# Patient Record
Sex: Female | Born: 1940
Health system: Southern US, Community
[De-identification: ages and names within clinical notes are randomized; demographics above are authoritative.]

## PROBLEM LIST (undated history)

## (undated) DIAGNOSIS — R053 Chronic cough: Secondary | ICD-10-CM

## (undated) DIAGNOSIS — N814 Uterovaginal prolapse, unspecified: Secondary | ICD-10-CM

## (undated) DIAGNOSIS — S32019A Unspecified fracture of first lumbar vertebra, initial encounter for closed fracture: Secondary | ICD-10-CM

## (undated) DIAGNOSIS — Z8719 Personal history of other diseases of the digestive system: Secondary | ICD-10-CM

## (undated) DIAGNOSIS — N840 Polyp of corpus uteri: Secondary | ICD-10-CM

## (undated) DIAGNOSIS — M1990 Unspecified osteoarthritis, unspecified site: Secondary | ICD-10-CM

## (undated) DIAGNOSIS — Z9289 Personal history of other medical treatment: Secondary | ICD-10-CM

## (undated) DIAGNOSIS — J302 Other seasonal allergic rhinitis: Secondary | ICD-10-CM

## (undated) DIAGNOSIS — Z860100 Personal history of colon polyps, unspecified: Secondary | ICD-10-CM

## (undated) DIAGNOSIS — N3281 Overactive bladder: Secondary | ICD-10-CM

## (undated) DIAGNOSIS — R21 Rash and other nonspecific skin eruption: Secondary | ICD-10-CM

## (undated) DIAGNOSIS — N950 Postmenopausal bleeding: Secondary | ICD-10-CM

## (undated) DIAGNOSIS — M542 Cervicalgia: Secondary | ICD-10-CM

## (undated) DIAGNOSIS — L309 Dermatitis, unspecified: Secondary | ICD-10-CM

## (undated) DIAGNOSIS — K449 Diaphragmatic hernia without obstruction or gangrene: Secondary | ICD-10-CM

## (undated) DIAGNOSIS — Z8781 Personal history of (healed) traumatic fracture: Secondary | ICD-10-CM

## (undated) DIAGNOSIS — M549 Dorsalgia, unspecified: Secondary | ICD-10-CM

## (undated) DIAGNOSIS — M5136 Other intervertebral disc degeneration, lumbar region: Secondary | ICD-10-CM

## (undated) DIAGNOSIS — T7840XA Allergy, unspecified, initial encounter: Secondary | ICD-10-CM

## (undated) DIAGNOSIS — G709 Myoneural disorder, unspecified: Secondary | ICD-10-CM

## (undated) DIAGNOSIS — F419 Anxiety disorder, unspecified: Secondary | ICD-10-CM

## (undated) DIAGNOSIS — M51369 Other intervertebral disc degeneration, lumbar region without mention of lumbar back pain or lower extremity pain: Secondary | ICD-10-CM

## (undated) DIAGNOSIS — H269 Unspecified cataract: Secondary | ICD-10-CM

## (undated) DIAGNOSIS — R05 Cough: Secondary | ICD-10-CM

## (undated) DIAGNOSIS — K219 Gastro-esophageal reflux disease without esophagitis: Secondary | ICD-10-CM

## (undated) DIAGNOSIS — R911 Solitary pulmonary nodule: Secondary | ICD-10-CM

## (undated) DIAGNOSIS — E0789 Other specified disorders of thyroid: Secondary | ICD-10-CM

## (undated) HISTORY — DX: Allergy, unspecified, initial encounter: T78.40XA

## (undated) HISTORY — PX: CYSTOSCOPY: SUR368

## (undated) HISTORY — DX: Unspecified cataract: H26.9

## (undated) HISTORY — DX: Dorsalgia, unspecified: M54.9

## (undated) HISTORY — DX: Unspecified fracture of first lumbar vertebra, initial encounter for closed fracture: S32.019A

## (undated) HISTORY — PX: CATARACT EXTRACTION: SUR2

## (undated) HISTORY — PX: LAPAROSCOPIC PARTIAL RIGHT COLECTOMY: SHX5913

## (undated) HISTORY — DX: Anxiety disorder, unspecified: F41.9

## (undated) HISTORY — DX: Myoneural disorder, unspecified: G70.9

## (undated) HISTORY — PX: OTHER SURGICAL HISTORY: SHX169

## (undated) HISTORY — DX: Cervicalgia: M54.2

## (undated) HISTORY — PX: CATARACT EXTRACTION W/ INTRAOCULAR LENS  IMPLANT, BILATERAL: SHX1307

## (undated) HISTORY — PX: EYE SURGERY: SHX253

---
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
## 2006-07-26 ENCOUNTER — Ambulatory Visit (HOSPITAL_COMMUNITY): Admission: RE | Admit: 2006-07-26 | Discharge: 2006-07-27 | Payer: Self-pay | Admitting: Ophthalmology

## 2009-02-18 ENCOUNTER — Encounter: Admission: RE | Admit: 2009-02-18 | Discharge: 2009-02-18 | Payer: Self-pay | Admitting: Family Medicine

## 2010-01-06 ENCOUNTER — Ambulatory Visit (HOSPITAL_COMMUNITY): Admission: RE | Admit: 2010-01-06 | Discharge: 2010-01-06 | Payer: Self-pay | Admitting: General Surgery

## 2010-02-04 ENCOUNTER — Inpatient Hospital Stay (HOSPITAL_COMMUNITY): Admission: RE | Admit: 2010-02-04 | Discharge: 2010-02-09 | Payer: Self-pay | Admitting: General Surgery

## 2010-02-04 ENCOUNTER — Encounter (INDEPENDENT_AMBULATORY_CARE_PROVIDER_SITE_OTHER): Payer: Self-pay | Admitting: General Surgery

## 2010-08-04 ENCOUNTER — Other Ambulatory Visit: Payer: Self-pay | Admitting: General Surgery

## 2010-08-12 ENCOUNTER — Other Ambulatory Visit: Payer: Self-pay

## 2010-08-26 LAB — CBC
HCT: 32.1 % — ABNORMAL LOW (ref 36.0–46.0)
Hemoglobin: 10.8 g/dL — ABNORMAL LOW (ref 12.0–15.0)
Hemoglobin: 11 g/dL — ABNORMAL LOW (ref 12.0–15.0)
MCH: 33.3 pg (ref 26.0–34.0)
MCHC: 34.2 g/dL (ref 30.0–36.0)
MCV: 97.3 fL (ref 78.0–100.0)
Platelets: 167 10*3/uL (ref 150–400)
RDW: 13.6 % (ref 11.5–15.5)
WBC: 5.8 10*3/uL (ref 4.0–10.5)

## 2010-08-26 LAB — BASIC METABOLIC PANEL
BUN: 2 mg/dL — ABNORMAL LOW (ref 6–23)
BUN: 5 mg/dL — ABNORMAL LOW (ref 6–23)
CO2: 29 mEq/L (ref 19–32)
Calcium: 8.2 mg/dL — ABNORMAL LOW (ref 8.4–10.5)
Chloride: 104 mEq/L (ref 96–112)
Creatinine, Ser: 0.8 mg/dL (ref 0.4–1.2)
GFR calc Af Amer: >60 mL/min (ref >60)
GFR calc Af Amer: >60 mL/min (ref >60)
GFR calc non Af Amer: >60 mL/min (ref >60)
GFR calc non Af Amer: >60 mL/min (ref >60)
Glucose, Bld: 114 mg/dL — ABNORMAL HIGH (ref 70–99)
Glucose, Bld: 185 mg/dL — ABNORMAL HIGH (ref 70–99)
Glucose, Bld: 96 mg/dL (ref 70–99)
Potassium: 4.5 mEq/L (ref 3.5–5.1)
Sodium: 134 mEq/L — ABNORMAL LOW (ref 135–145)

## 2010-08-26 LAB — MAGNESIUM
Magnesium: 1.5 mg/dL (ref 1.5–2.5)
Magnesium: 2.1 mg/dL (ref 1.5–2.5)

## 2010-08-26 LAB — COMPREHENSIVE METABOLIC PANEL
AST: 20 U/L (ref 0–37)
Chloride: 106 mEq/L (ref 96–112)
Creatinine, Ser: 0.79 mg/dL (ref 0.4–1.2)
GFR calc non Af Amer: >60 mL/min (ref >60)
Glucose, Bld: 82 mg/dL (ref 70–99)
Potassium: 4.1 mEq/L (ref 3.5–5.1)
Total Bilirubin: 0.6 mg/dL (ref 0.3–1.2)

## 2010-08-26 LAB — DIFFERENTIAL
Basophils Relative: 0 % (ref 0–1)
Eosinophils Relative: 4 % (ref 0–5)
Lymphocytes Relative: 31 % (ref 12–46)
Lymphs Abs: 1.6 10*3/uL (ref 0.7–4.0)
Monocytes Absolute: 0.4 10*3/uL (ref 0.1–1.0)
Neutro Abs: 2.9 10*3/uL (ref 1.7–7.7)

## 2010-08-26 LAB — PROTIME-INR
INR: 0.86 (ref 0.00–1.49)
Prothrombin Time: 11.9 seconds (ref 11.6–15.2)

## 2010-08-27 LAB — COMPREHENSIVE METABOLIC PANEL
Alkaline Phosphatase: 48 U/L (ref 39–117)
BUN: 8 mg/dL (ref 6–23)
Creatinine, Ser: 0.64 mg/dL (ref 0.4–1.2)
GFR calc Af Amer: >60 mL/min (ref >60)
Potassium: 3.9 mEq/L (ref 3.5–5.1)
Total Bilirubin: 1 mg/dL (ref 0.3–1.2)
Total Protein: 6.4 g/dL (ref 6.0–8.3)

## 2010-08-27 LAB — DIFFERENTIAL
Basophils Absolute: 0 10*3/uL (ref 0.0–0.1)
Basophils Relative: 0 % (ref 0–1)
Eosinophils Relative: 5 % (ref 0–5)
Lymphocytes Relative: 33 % (ref 12–46)
Lymphs Abs: 1.2 10*3/uL (ref 0.7–4.0)
Neutrophils Relative %: 54 % (ref 43–77)

## 2010-08-27 LAB — PROTIME-INR
INR: 0.98 (ref 0.00–1.49)
Prothrombin Time: 12.9 seconds (ref 11.6–15.2)

## 2010-08-27 LAB — SURGICAL PCR SCREEN
MRSA, PCR: NEGATIVE
Staphylococcus aureus: NEGATIVE

## 2010-08-27 LAB — CBC
Hemoglobin: 12.2 g/dL (ref 12.0–15.0)
MCH: 33.7 pg (ref 26.0–34.0)
MCHC: 34.9 g/dL (ref 30.0–36.0)
MCV: 96.5 fL (ref 78.0–100.0)

## 2010-10-28 NOTE — Op Note (Signed)
NAMEMANUELITA, MOXON               ACCOUNT NO.:  [id number]   MEDICAL RECORD NO.:  [id number]          PATIENT TYPE:  AMB   LOCATION:  SDS                          FACILITY:  [facility]   PHYSICIAN:  John D. Ashley Royalty, M.D. 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   PROCEDURE:  Pars plana vitrectomy, retinal photocoagulation, membrane  peel, left eye.   SURGEON:  Alan Mulder, M.D.   ASSISTANT:  Rosalie Doctor, MA   ANESTHESIA:  General.   DETAILS:  Usual prep and drape, lid speculum was placed.  The indirect  ophthalmoscope laser was moved into place and 1145 burns placed around  the retinal periphery in two concentric rows.  The power was between 500  and 560 milliwatts, 1000 microns each and 0.1 seconds each.  The  conjunctival peritomy, peritomies at 10 to 4 o'clock.  The infusion at 4  o'clock.  Contact lens ring sutured into place at 3, at 6 and 12  o'clock.  The lighted pick and the cutter were placed at 10 and 2  o'clock respectively.  Pars plana vitrectomy was begun just behind the  crystalline lens.  Membranes were encountered and removed under low  suction and rapid cutting.  The flat contact lens was placed and the  macula was inspected.  Macula was drawn superiorly superotemporally and  severely folded in this area.  The magnifying contact lens was placed.  The membrane was engaged with the 20-gauge MVR blade and peeled with the  ring forceps.  The entire macular region was peeled both for the disk  and temporally.  The macula was thrown into folds but it was free and  mobile the diamond dusted membrane scraper was used to smooth the  macular region.  The contact lens ring and the contact lens were  removed.  The Biom was moved into place.  The vitrectomy was carried  into the far periphery with the vitreous cutter.  Once all  vitreous was  removed down to the vitreous blunt and sharp dissection, the periphery  was inspected.  There were no breaks.  The instruments were removed from  the eye. 9-0 nylon was used to close the sclerotomy sites.  The  conjunctiva was closed with wet-field cautery.  Polymyxin and gentamicin  were irrigated into Tenon's space, Decadron 10 mg was injected to the  lower subconjunctival space.  Marcaine was injected around the globe for  postop pain.  Atropine was applied.  TobraDex ophthalmic ointment, a  patch and shield were placed.  Closing pressure was 10 with a [place]  tonometer.  The patient is awakened and taken to recovery in  satisfactory condition.   COMPLICATIONS:  None.   DURATION:  1 hour.     Beulah Gandy. Ashley Royalty, M.D.  Electronically Signed    JDM/MEDQ  D:  07/26/2006  T:  07/26/2006  Job:  [id number]

## 2011-11-01 ENCOUNTER — Other Ambulatory Visit: Payer: Self-pay | Admitting: Family Medicine

## 2011-11-01 DIAGNOSIS — M2550 Pain in unspecified joint: Secondary | ICD-10-CM

## 2011-11-01 MED ORDER — IBUPROFEN 600 MG PO TABS: 600.0000 mg | ORAL_TABLET | Freq: Three times a day (TID) | ORAL | Status: DC | PRN

## 2011-11-02 ENCOUNTER — Other Ambulatory Visit: Payer: Self-pay | Admitting: Family Medicine

## 2012-01-19 ENCOUNTER — Encounter: Payer: Self-pay | Admitting: Family Medicine

## 2013-01-07 ENCOUNTER — Ambulatory Visit (INDEPENDENT_AMBULATORY_CARE_PROVIDER_SITE_OTHER): Payer: Medicare PPO | Admitting: Emergency Medicine

## 2013-01-07 VITALS — BP 115/65 | HR 80 | Temp 98.7°F | Resp 18 | Wt 151.0 lb

## 2013-01-07 DIAGNOSIS — M719 Bursopathy, unspecified: Secondary | ICD-10-CM

## 2013-01-07 DIAGNOSIS — M25511 Pain in right shoulder: Secondary | ICD-10-CM

## 2013-01-07 DIAGNOSIS — M25519 Pain in unspecified shoulder: Secondary | ICD-10-CM

## 2013-01-07 DIAGNOSIS — M7551 Bursitis of right shoulder: Secondary | ICD-10-CM

## 2013-01-07 MED ORDER — MELOXICAM 7.5 MG PO TABS: 7.5000 mg | ORAL_TABLET | Freq: Every day | ORAL | Status: DC

## 2013-01-07 NOTE — Patient Instructions (Addendum)
Shoulder Pain  The shoulder is the joint that connects your arms to your body. The bones that form the shoulder joint include the upper arm bone (humerus), the shoulder blade (scapula), and the collarbone (clavicle). The top of the humerus is shaped like a ball and fits into a rather flat socket on the scapula (glenoid cavity). A combination of muscles and strong, fibrous tissues that connect muscles to bones (tendons) support your shoulder joint and hold the ball in the socket. Small, fluid-filled sacs (bursae) are located in different areas of the joint. They act as cushions between the bones and the overlying soft tissues and help reduce friction between the gliding tendons and the bone as you move your arm. Your shoulder joint allows a wide range of motion in your arm. This range of motion allows you to do things like scratch your back or throw a ball. However, this range of motion also makes your shoulder more prone to pain from overuse and injury.  Causes of shoulder pain can originate from both injury and overuse and usually can be grouped in the following four categories:   Redness, swelling, and pain (inflammation) of the tendon (tendinitis) or the bursae (bursitis).   Instability, such as a dislocation of the joint.   Inflammation of the joint (arthritis).   Broken bone (fracture).  HOME CARE INSTRUCTIONS    Apply ice to the sore area.   Put ice in a plastic bag.   Place a towel between your skin and the bag.   Leave the ice on for 15-20 minutes, 3-4 times per day for the first 2 days.   Stop using cold packs if they do not help with the pain.   If you have a shoulder sling or immobilizer, wear it as long as your caregiver instructs. Only remove it to shower or bathe. Move your arm as little as possible, but keep your hand moving to prevent swelling.   Squeeze a soft ball or foam pad as much as possible to help prevent swelling.   Only take over-the-counter or prescription medicines for pain,  discomfort, or fever as directed by your caregiver.  SEEK MEDICAL CARE IF:    Your shoulder pain increases, or new pain develops in your arm, hand, or fingers.   Your hand or fingers become cold and numb.   Your pain is not relieved with medicines.  SEEK IMMEDIATE MEDICAL CARE IF:    Your arm, hand, or fingers are numb or tingling.   Your arm, hand, or fingers are significantly swollen or turn white or blue.  MAKE SURE YOU:    Understand these instructions.   Will watch your condition.   Will get help right away if you are not doing well or get worse.  Document Released: 03/08/2005 Document Revised: 02/21/2012 Document Reviewed: 05/13/2011  ExitCare Patient Information 2014 [employer].

## 2013-01-07 NOTE — Progress Notes (Signed)
[facility] [address] [place] [address] [phone]  Date:  01/07/2013   Name:  Sheila Anderson   DOB:  Mar 04, 1941   MRN:  [id number]  PCP:  No primary provider on file.    Chief Complaint: Shoulder Pain and Rash   History of Present Illness:  Sheila Anderson is a 72 y.o. very pleasant female patient who presents with the following:  History of injury to her shoulder and has a right rotator cuff tear.  Says she is experiencing increased pain in her shoulder recently.  She says that she often is treated with an injection of cortisone.  Has no recent overuse or reinjury.  No improvement with over the counter medications or other home remedies. Denies other complaint or health concern today.   There are no active problems to display for this patient.   Past Medical History  Diagnosis Date  . Allergy   . Neuromuscular disorder   . Cataract   . Anxiety     Past Surgical History  Procedure Laterality Date  . Eye surgery    . Cyst removal from bladder      History  Substance Use Topics  . Smoking status: Never Smoker   . Smokeless tobacco: Not on file  . Alcohol Use: Yes    Family History  Problem Relation Age of Onset  . Heart disease Mother   . Congestive Heart Failure Father   . Multiple sclerosis Sister     No Known Allergies  Medication list has been reviewed and updated.  No current outpatient prescriptions on file prior to visit.   No current facility-administered medications on file prior to visit.    Review of Systems:  As per HPI, otherwise negative.    Physical Examination: Filed Vitals:   01/07/13 1119  BP: 115/65  Pulse: 80  Temp: 98.7 F (37.1 C)  Resp: 18   Filed Vitals:   01/07/13 1119  Weight: 151 lb (68.493 kg)   There is no height on file to calculate BMI. Ideal Body Weight:     GEN: WDWN, NAD, Non-toxic, Alert & Oriented x 3 HEENT: Atraumatic, Normocephalic.  Ears and Nose: No external  deformity. EXTR: No clubbing/cyanosis/edema NEURO: Normal gait.  PSYCH: Normally interactive. Conversant. Not depressed or anxious appearing.  Calm demeanor.  RIGHT shoulder:  Tender anterior shoulder bursa  Assessment and Plan: RIGHT shoulder bursitis Depo 80 marcaine 1.5 ml mobic  Signed,  Phillips Odor, MD

## 2013-02-03 ENCOUNTER — Encounter (INDEPENDENT_AMBULATORY_CARE_PROVIDER_SITE_OTHER): Payer: Medicare PPO | Admitting: Ophthalmology

## 2013-02-03 DIAGNOSIS — H2510 Age-related nuclear cataract, unspecified eye: Secondary | ICD-10-CM

## 2013-02-03 DIAGNOSIS — H43819 Vitreous degeneration, unspecified eye: Secondary | ICD-10-CM

## 2013-02-03 DIAGNOSIS — H35379 Puckering of macula, unspecified eye: Secondary | ICD-10-CM

## 2013-02-03 DIAGNOSIS — H3530 Unspecified macular degeneration: Secondary | ICD-10-CM

## 2013-05-27 ENCOUNTER — Ambulatory Visit: Payer: Medicare PPO

## 2013-05-27 ENCOUNTER — Ambulatory Visit (INDEPENDENT_AMBULATORY_CARE_PROVIDER_SITE_OTHER): Payer: Medicare PPO | Admitting: Family Medicine

## 2013-05-27 VITALS — BP 117/72 | HR 76 | Temp 98.4°F | Resp 16 | Ht 67.0 in | Wt 151.0 lb

## 2013-05-27 DIAGNOSIS — R05 Cough: Secondary | ICD-10-CM

## 2013-05-27 DIAGNOSIS — M719 Bursopathy, unspecified: Secondary | ICD-10-CM

## 2013-05-27 DIAGNOSIS — R079 Chest pain, unspecified: Secondary | ICD-10-CM

## 2013-05-27 DIAGNOSIS — M7150 Other bursitis, not elsewhere classified, unspecified site: Secondary | ICD-10-CM

## 2013-05-27 DIAGNOSIS — R0781 Pleurodynia: Secondary | ICD-10-CM

## 2013-05-27 DIAGNOSIS — R059 Cough, unspecified: Secondary | ICD-10-CM

## 2013-05-27 MED ORDER — PREDNISONE 20 MG PO TABS: 40.0000 mg | ORAL_TABLET | Freq: Every day | ORAL | Status: DC

## 2013-05-27 NOTE — Patient Instructions (Signed)
Gastroesophageal Reflux Disease, Adult  Gastroesophageal reflux disease (GERD) happens when acid from your stomach flows up into the esophagus. When acid comes in contact with the esophagus, the acid causes soreness (inflammation) in the esophagus. Over time, GERD may create small holes (ulcers) in the lining of the esophagus.  CAUSES   · Increased body weight. This puts pressure on the stomach, making acid rise from the stomach into the esophagus.  · Smoking. This increases acid production in the stomach.  · Drinking alcohol. This causes decreased pressure in the lower esophageal sphincter (valve or ring of muscle between the esophagus and stomach), allowing acid from the stomach into the esophagus.  · Late evening meals and a full stomach. This increases pressure and acid production in the stomach.  · A malformed lower esophageal sphincter.  Sometimes, no cause is found.  SYMPTOMS   · Burning pain in the lower part of the mid-chest behind the breastbone and in the mid-stomach area. This may occur twice a week or more often.  · Trouble swallowing.  · Sore throat.  · Dry cough.  · Asthma-like symptoms including chest tightness, shortness of breath, or wheezing.  DIAGNOSIS   Your caregiver may be able to diagnose GERD based on your symptoms. In some cases, X-rays and other tests may be done to check for complications or to check the condition of your stomach and esophagus.  TREATMENT   Your caregiver may recommend over-the-counter or prescription medicines to help decrease acid production. Ask your caregiver before starting or adding any new medicines.   HOME CARE INSTRUCTIONS   · Change the factors that you can control. Ask your caregiver for guidance concerning weight loss, quitting smoking, and alcohol consumption.  · Avoid foods and drinks that make your symptoms worse, such as:  · Caffeine or alcoholic drinks.  · Chocolate.  · Peppermint or mint flavorings.  · Garlic and onions.  · Spicy foods.  · Citrus fruits,  such as oranges, lemons, or limes.  · Tomato-based foods such as sauce, chili, salsa, and pizza.  · Fried and fatty foods.  · Avoid lying down for the 3 hours prior to your bedtime or prior to taking a nap.  · Eat small, frequent meals instead of large meals.  · Wear loose-fitting clothing. Do not wear anything tight around your waist that causes pressure on your stomach.  · Raise the head of your bed 6 to 8 inches with wood blocks to help you sleep. Extra pillows will not help.  · Only take over-the-counter or prescription medicines for pain, discomfort, or fever as directed by your caregiver.  · Do not take aspirin, ibuprofen, or other nonsteroidal anti-inflammatory drugs (NSAIDs).  SEEK IMMEDIATE MEDICAL CARE IF:   · You have pain in your arms, neck, jaw, teeth, or back.  · Your pain increases or changes in intensity or duration.  · You develop nausea, vomiting, or sweating (diaphoresis).  · You develop shortness of breath, or you faint.  · Your vomit is green, yellow, black, or looks like coffee grounds or blood.  · Your stool is red, bloody, or black.  These symptoms could be signs of other problems, such as heart disease, gastric bleeding, or esophageal bleeding.  MAKE SURE YOU:   · Understand these instructions.  · Will watch your condition.  · Will get help right away if you are not doing well or get worse.  Document Released: 03/08/2005 Document Revised: 08/21/2011 Document Reviewed: 12/16/2010  ExitCare® Patient   Information ©2014 [employer].

## 2013-05-27 NOTE — Progress Notes (Signed)
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 She has cats at home although they don't go into her bedroom.  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  Objective:  NAD Chest clear HEENT: mild injection of conjunctiviae Left ischium is tender.  Trochanter is normal.  SLR normal. No bony abn, normal ROM  [facility] reading (PRIMARY) by  Dr. Milus Glazier:  Normal left ribs and chest  Cough - Plan: DG Ribs Unilateral W/Chest Left, DG Chest 2 View, predniSONE (DELTASONE) 20 MG tablet  Rib pain on left side - Plan: DG Ribs Unilateral W/Chest Left, predniSONE (DELTASONE) 20 MG tablet  Bursitis - Plan: predniSONE (DELTASONE) 20 MG tablet  Signed, Elvina Sidle, MD  .

## 2013-07-31 ENCOUNTER — Ambulatory Visit (INDEPENDENT_AMBULATORY_CARE_PROVIDER_SITE_OTHER): Payer: Medicare PPO | Admitting: Family Medicine

## 2013-07-31 VITALS — BP 100/56 | HR 79 | Temp 98.4°F | Resp 16 | Ht 67.5 in | Wt 155.0 lb

## 2013-07-31 DIAGNOSIS — Z7184 Encounter for health counseling related to travel: Secondary | ICD-10-CM

## 2013-07-31 DIAGNOSIS — Z7189 Other specified counseling: Secondary | ICD-10-CM

## 2013-07-31 DIAGNOSIS — Z23 Encounter for immunization: Secondary | ICD-10-CM

## 2013-07-31 MED ORDER — TYPHOID VACCINE PO CPDR: 1.0000 | DELAYED_RELEASE_CAPSULE | ORAL | Status: DC

## 2013-07-31 MED ORDER — HEPATITIS A VACCINE 1440 EL U/ML IM SUSP: 1.0000 mL | Freq: Once | INTRAMUSCULAR | Status: DC

## 2013-07-31 NOTE — Patient Instructions (Signed)
Thank you for coming in today  1. We will give you your first Hep A vaccine today. The second injection is due in 6-12 months 2. Typhoid vaccine is an oral medication that you will get from your pharmacy. Take 1 tablet every other day for a total of 4 doses. You should complete this at least 1 week before travel.  CDC website is an Youth worker.

## 2013-07-31 NOTE — Progress Notes (Signed)
   Subjective:    Patient ID: Sheila Anderson, female    DOB: 07/22/1940, 73 y.o.   MRN: [id number]  HPI Patient is going to [place], [place] and presents for evaluation for pre-travel vaccines and medications. Leaving March 28th, 2015.  No medical problems, very healthy. NKDA. Got flu and shingles vaccine today.  Review of Systems  All other systems reviewed and are negative.      Objective:   Physical Exam  Constitutional: She is oriented to person, place, and time. She appears well-developed and well-nourished. No distress.  HENT:  Head: Normocephalic and atraumatic.  Eyes: Conjunctivae are normal. Pupils are equal, round, and reactive to light. No scleral icterus.  Cardiovascular: Normal rate.   Pulmonary/Chest: Effort normal.  Musculoskeletal: Normal range of motion.  Neurological: She is alert and oriented to person, place, and time.  Skin: Skin is warm and dry. She is not diaphoretic.  Psychiatric: She has a normal mood and affect. Her behavior is normal.      Assessment & Plan:  #1. Travel preparation - No malaria area risk - Typhoid vaccine Rx sent and discussed - Hep A today

## 2014-02-05 ENCOUNTER — Ambulatory Visit (INDEPENDENT_AMBULATORY_CARE_PROVIDER_SITE_OTHER): Payer: Medicare PPO | Admitting: Ophthalmology

## 2014-02-17 ENCOUNTER — Ambulatory Visit (INDEPENDENT_AMBULATORY_CARE_PROVIDER_SITE_OTHER): Payer: Medicare PPO | Admitting: Ophthalmology

## 2014-03-03 ENCOUNTER — Ambulatory Visit (INDEPENDENT_AMBULATORY_CARE_PROVIDER_SITE_OTHER): Payer: Medicare PPO | Admitting: Ophthalmology

## 2014-03-03 DIAGNOSIS — H3530 Unspecified macular degeneration: Secondary | ICD-10-CM

## 2014-03-03 DIAGNOSIS — H35379 Puckering of macula, unspecified eye: Secondary | ICD-10-CM

## 2014-03-03 DIAGNOSIS — H43819 Vitreous degeneration, unspecified eye: Secondary | ICD-10-CM

## 2014-03-03 DIAGNOSIS — H2510 Age-related nuclear cataract, unspecified eye: Secondary | ICD-10-CM

## 2014-07-20 DIAGNOSIS — Z961 Presence of intraocular lens: Secondary | ICD-10-CM | POA: Diagnosis not present

## 2014-07-20 DIAGNOSIS — H2511 Age-related nuclear cataract, right eye: Secondary | ICD-10-CM | POA: Diagnosis not present

## 2014-07-21 DIAGNOSIS — H5201 Hypermetropia, right eye: Secondary | ICD-10-CM | POA: Diagnosis not present

## 2014-07-21 DIAGNOSIS — Z961 Presence of intraocular lens: Secondary | ICD-10-CM | POA: Diagnosis not present

## 2014-07-21 DIAGNOSIS — H35372 Puckering of macula, left eye: Secondary | ICD-10-CM | POA: Diagnosis not present

## 2014-07-21 DIAGNOSIS — H2511 Age-related nuclear cataract, right eye: Secondary | ICD-10-CM | POA: Diagnosis not present

## 2014-07-21 DIAGNOSIS — H524 Presbyopia: Secondary | ICD-10-CM | POA: Diagnosis not present

## 2014-09-09 ENCOUNTER — Telehealth: Payer: Self-pay

## 2015-01-07 DIAGNOSIS — M6740 Ganglion, unspecified site: Secondary | ICD-10-CM | POA: Diagnosis not present

## 2015-01-07 DIAGNOSIS — L821 Other seborrheic keratosis: Secondary | ICD-10-CM | POA: Diagnosis not present

## 2015-01-07 DIAGNOSIS — S60561A Insect bite (nonvenomous) of right hand, initial encounter: Secondary | ICD-10-CM | POA: Diagnosis not present

## 2015-01-07 DIAGNOSIS — L918 Other hypertrophic disorders of the skin: Secondary | ICD-10-CM | POA: Diagnosis not present

## 2015-01-12 ENCOUNTER — Encounter (HOSPITAL_COMMUNITY): Payer: Self-pay | Admitting: *Deleted

## 2015-01-12 ENCOUNTER — Emergency Department (HOSPITAL_COMMUNITY)
Admission: EM | Admit: 2015-01-12 | Discharge: 2015-01-12 | Disposition: A | Discharge status: Home / Self Care | Payer: Medicare PPO | Attending: Emergency Medicine | Admitting: Emergency Medicine

## 2015-01-12 ENCOUNTER — Emergency Department (HOSPITAL_COMMUNITY): Payer: Medicare PPO

## 2015-01-12 ENCOUNTER — Ambulatory Visit (INDEPENDENT_AMBULATORY_CARE_PROVIDER_SITE_OTHER): Payer: Medicare PPO | Admitting: Family Medicine

## 2015-01-12 VITALS — BP 132/68 | HR 88 | Temp 98.4°F | Resp 18 | Ht 72.0 in | Wt 153.4 lb

## 2015-01-12 DIAGNOSIS — M25512 Pain in left shoulder: Secondary | ICD-10-CM | POA: Insufficient documentation

## 2015-01-12 DIAGNOSIS — R030 Elevated blood-pressure reading, without diagnosis of hypertension: Secondary | ICD-10-CM | POA: Diagnosis not present

## 2015-01-12 DIAGNOSIS — R9431 Abnormal electrocardiogram [ECG] [EKG]: Secondary | ICD-10-CM | POA: Diagnosis not present

## 2015-01-12 DIAGNOSIS — R0602 Shortness of breath: Secondary | ICD-10-CM | POA: Diagnosis not present

## 2015-01-12 DIAGNOSIS — R05 Cough: Secondary | ICD-10-CM

## 2015-01-12 DIAGNOSIS — Z8659 Personal history of other mental and behavioral disorders: Secondary | ICD-10-CM | POA: Insufficient documentation

## 2015-01-12 DIAGNOSIS — H269 Unspecified cataract: Secondary | ICD-10-CM | POA: Insufficient documentation

## 2015-01-12 DIAGNOSIS — Z79899 Other long term (current) drug therapy: Secondary | ICD-10-CM | POA: Insufficient documentation

## 2015-01-12 DIAGNOSIS — R350 Frequency of micturition: Secondary | ICD-10-CM

## 2015-01-12 DIAGNOSIS — R059 Cough, unspecified: Secondary | ICD-10-CM

## 2015-01-12 LAB — CBC WITH DIFFERENTIAL/PLATELET
BASOS ABS: 0 10*3/uL (ref 0.0–0.1)
BASOS PCT: 1 % (ref 0–1)
EOS PCT: 4 % (ref 0–5)
Eosinophils Absolute: 0.2 10*3/uL (ref 0.0–0.7)
HEMATOCRIT: 40.2 % (ref 36.0–46.0)
HEMOGLOBIN: 13.4 g/dL (ref 12.0–15.0)
Lymphocytes Relative: 38 % (ref 12–46)
Lymphs Abs: 1.4 10*3/uL (ref 0.7–4.0)
MCH: 31.7 pg (ref 26.0–34.0)
MCHC: 33.3 g/dL (ref 30.0–36.0)
MCV: 95 fL (ref 78.0–100.0)
Monocytes Absolute: 0.4 10*3/uL (ref 0.1–1.0)
Monocytes Relative: 11 % (ref 3–12)
NEUTROS PCT: 46 % (ref 43–77)
Neutro Abs: 1.7 10*3/uL (ref 1.7–7.7)
Platelets: 253 10*3/uL (ref 150–400)
RBC: 4.23 MIL/uL (ref 3.87–5.11)
RDW: 13.3 % (ref 11.5–15.5)
WBC: 3.7 10*3/uL — ABNORMAL LOW (ref 4.0–10.5)

## 2015-01-12 LAB — I-STAT TROPONIN, ED: TROPONIN I, POC: 0 ng/mL (ref 0.00–0.08)

## 2015-01-12 LAB — BASIC METABOLIC PANEL
ANION GAP: 10 (ref 5–15)
BUN: 11 mg/dL (ref 6–20)
CO2: 27 mmol/L (ref 22–32)
Calcium: 9.7 mg/dL (ref 8.9–10.3)
Chloride: 104 mmol/L (ref 101–111)
Creatinine, Ser: 0.68 mg/dL (ref 0.44–1.00)
Glucose, Bld: 78 mg/dL (ref 65–99)
Potassium: 3.7 mmol/L (ref 3.5–5.1)
Sodium: 141 mmol/L (ref 135–145)

## 2015-01-12 MED ORDER — MOMETASONE FUROATE 50 MCG/ACT NA SUSP: 2.0000 | Freq: Every day | NASAL | Status: DC

## 2015-01-12 NOTE — Patient Instructions (Signed)
Increase your Zantac to 150 mg 2x/day for at least 2 weeks to see if your heartburn is changed.  Use the nose spray to see if that will help your cough.  We placed a PPD today because of your possible exposure to TB earlier this year - it needs to be read in 48-72 hours.  [facility] for your likely overactive bladder to help with that.

## 2015-01-12 NOTE — ED Provider Notes (Signed)
CSN: [id number]     Arrival date & time 01/12/15  1642 History   First MD Initiated Contact with Patient 01/12/15 1654     Chief Complaint  Patient presents with  . Shortness of Breath  . Shoulder Pain     (Consider location/radiation/quality/duration/timing/severity/associated sxs/prior Treatment) The history is provided by the patient and medical records.   74 year old female with history of allergies, anxiety, cataracts, presenting to the ED for shortness of breath. Patient states she was at the beauty salon getting her hair done earlier today when symptoms began. She states she was sitting underneath the dry air and got hot since she turned the temperature down but then became cold. She states all of a sudden she began to feel short of breath but thought this was due to her anxiety. She states she was seen in urgent care but continue to have a slight sensation of shortness of breath and was sent here for further evaluation. Patient also reported some left shoulder pain at urgent care. 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 She denies any known injury, trauma, or falls to left shoulder. She has no numbness or weakness of left arm. She has no reported chest pain. She has no known cardiac history. No recent travel, surgeries, trauma, prolonged immobilization, estrogen use.  No history of DVT or PE. Vital signs stable.  Past Medical History  Diagnosis Date  . Allergy   . Neuromuscular disorder   . Cataract   . Anxiety   . Anxiety    Past Surgical History  Procedure Laterality Date  . Eye surgery    . Cyst removal from bladder     Family History  Problem Relation Age of Onset  . Heart disease Mother   . Congestive Heart Failure Father   . Multiple sclerosis Sister    History  Substance Use Topics  . Smoking status: Never Smoker   . Smokeless tobacco: Not on file  . Alcohol Use: Yes   OB History    No data available     Review of Systems    Respiratory: Positive for shortness of breath.   All other systems reviewed and are negative.     Allergies  Nickel  Home Medications   Prior to Admission medications   Medication Sig Start Date End Date Taking? Authorizing Provider  Ascorbic Acid (VITAMIN C PO) Take 1 tablet by mouth as needed.    Historical Provider, MD  cetirizine (ZYRTEC) 10 MG tablet Take 10 mg by mouth daily.    Historical Provider, MD  COD LIVER OIL PO Take 1 capsule by mouth daily.    Historical Provider, MD  Coenzyme Q10 (CO Q 10 PO) Take 1 tablet by mouth daily.    Historical Provider, MD  Glucos-MSM-C-Mn-Ginger-Willow (GLUCOSAMINE MSM COMPLEX PO) Take 1 capsule by mouth daily.    Historical Provider, MD  mometasone (NASONEX) 50 MCG/ACT nasal spray Place 2 sprays into the nose daily. 01/12/15   Mancel Bale, PA-C  Multiple Vitamins-Minerals (ICAPS AREDS 2 PO) Take 1 capsule by mouth daily.    Historical Provider, MD  Multiple Vitamins-Minerals (ONE-A-DAY WOMENS 50 PLUS PO) Take 1 tablet by mouth daily.    Historical Provider, MD  Multiple Vitamins-Minerals (PRESERVISION AREDS 2 PO) Take 1 tablet by mouth daily.    Historical Provider, MD  phenazopyridine (PYRIDIUM) 97 MG tablet Take 97 mg by mouth 3 (three) times daily as needed for pain.  Historical Provider, MD  ranitidine (ZANTAC) 150 MG tablet Take 150 mg by mouth as needed for heartburn.    Historical Provider, MD  Soy Protein-Soy Isoflavone (SOY CARE MENOPAUSE PO) Take 1 tablet by mouth 2 (two) times daily.    Historical Provider, MD  VITAMIN E PO Take 1 capsule by mouth as needed.    Historical Provider, MD   BP 138/84 mmHg  Pulse 78  Temp(Src) 98.6 F (37 C) (Oral)  Resp 24  SpO2 97%   Physical Exam  Constitutional: She is oriented to person, place, and time. She appears well-developed and well-nourished. No distress.  HENT:  Head: Normocephalic and atraumatic.  Mouth/Throat: Oropharynx is clear and moist.  Eyes: Conjunctivae and EOM are  normal. Pupils are equal, round, and reactive to light.  Neck: Normal range of motion. Neck supple.  Cardiovascular: Normal rate, regular rhythm and normal heart sounds.   Pulmonary/Chest: Effort normal and breath sounds normal. No respiratory distress. She has no wheezes.  Abdominal: Soft. Bowel sounds are normal. There is no tenderness. There is no guarding.  Musculoskeletal: Normal range of motion. She exhibits no edema.       Arms: Reproducible tenderness of left superior shoulder without acute bony deformity; no signs of trauma; full range of motion maintained; no swelling or varicosities noted; arm remains neurovascularly intact  Neurological: She is alert and oriented to person, place, and time.  Skin: Skin is warm and dry. She is not diaphoretic.  Psychiatric: She has a normal mood and affect.  Nursing note and vitals reviewed.   ED Course  Procedures (including critical care time) Labs Review Labs Reviewed  CBC WITH DIFFERENTIAL/PLATELET - Abnormal; Notable for the following:    WBC 3.7 (*)    All other components within normal limits  BASIC METABOLIC PANEL  I-STAT TROPOININ, ED    Imaging Review Dg Chest 2 View  01/12/2015   CLINICAL DATA:  SOB today, felt hot then cold and anxious. Left shoulder pain x 2 days.  EXAM: CHEST  2 VIEW  COMPARISON:  None.  FINDINGS: Cardiac silhouette is normal in size and configuration. Normal mediastinal and hilar contours. The lungs are hyperexpanded. Lungs are clear. No pleural effusion or pneumothorax.  Bony thorax is demineralized but grossly intact.  IMPRESSION: No acute cardiopulmonary disease.   Electronically Signed   By: Lajean Manes M.D.   On: 01/12/2015 17:51     EKG Interpretation   Date/Time:  Tuesday January 12 2015 16:46:44 EDT Ventricular Rate:  71 PR Interval:  155 QRS Duration: 99 QT Interval:  401 QTC Calculation: 436 R Axis:   77 Text Interpretation:  Sinus rhythm no ST/T abnormality Confirmed by  [facility]  MD, TREY  ([id number]) on 01/12/2015 5:13:55 PM      MDM   Final diagnoses:  SOB (shortness of breath)  Left shoulder pain   74 year old female here with shortness of breath. She was evaluated at urgent care and sent here for further evaluation. Patient is otherwise healthy, no known cardiac disease. EKG is reassuring. Chest x-ray is clear.  Labwork is reassuring.  Remains without any complaint of chest pain.  Patient's left shoulder pain seems musculoskeletal as it is reproducible on exam. 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  She has no known cardiac RF, no RF for DVT/PE.  Symptoms seem atypical and may be related to anxiety. I have lower suspicion for ACS, PE, dissection, or other acute cardiac event at this time  given negative work-up here and lack of chest pain.  Patient d/c home in stable condition.  Cardiology follow-up given for any new/worsening symptoms.  Discussed plan with patient, he/she acknowledged understanding and agreed with plan of care.  Return precautions given for new or worsening symptoms.  Case discussed with attending physician, Dr. Doy Mince, who evaluated patient and agrees with assessment and plan of care.  Larene Pickett, PA-C 01/12/15 2022  Serita Grit, MD 01/14/15 [phone]

## 2015-01-12 NOTE — Progress Notes (Signed)
Sheila Anderson  MRN: [id number] DOB: 1940-11-21  Subjective:  Pt presents to clinic with SOB that started while she was under the dryer getting her hair done. She thought the heat was to hot so she turned it down but her SOB did not get better.  She thought her bra might be to tight but she took it off and that did not help.  She felt a little anxious during this time but she did not have chest pain.  She still about 1.5h later has the sensation that she cannot get a full deep breath.    She has had a cough for several months that has been attributed to her allergies and reflux.  She takes zantac as needed for her heartburn when she has symptoms and zyrtec daily for her allergies but she does not feel like it is helping that much anymore.  She is still congested and feels like stuff runs down the back of her throat.  She feels like the cough is worse at night and she does not lay flat as a result.  She was exposed to TB at work several months ago but she has not other TB symptoms.  Did not sleep well last night. - she thinks related to the left shoulder - last few days.  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  She also has overactive bladder and has had for a while which she uses AZO for but it has not been working successfully recently.  Patient Active Problem List   Diagnosis Date Noted  . Left shoulder pain 01/12/2015    Current Outpatient Prescriptions on File Prior to Visit  Medication Sig Dispense Refill  . cetirizine (ZYRTEC) 10 MG tablet Take 10 mg by mouth daily.    . COD LIVER OIL PO Take 1 capsule by mouth daily.    . Coenzyme Q10 (CO Q 10 PO) Take 1 tablet by mouth daily.    Donnie Aho (GLUCOSAMINE MSM COMPLEX PO) Take 1 capsule by mouth daily.    . Multiple Vitamins-Minerals (ONE-A-DAY WOMENS 50 PLUS PO) Take 1 tablet by mouth daily.    . Multiple Vitamins-Minerals (PRESERVISION AREDS 2 PO) Take 1 tablet by mouth daily.    . Soy  Protein-Soy Isoflavone (SOY CARE MENOPAUSE PO) Take 1 tablet by mouth 2 (two) times daily.    Marland Kitchen VITAMIN E PO Take 1 capsule by mouth as needed.    . Ascorbic Acid (VITAMIN C PO) Take 1 tablet by mouth as needed.    . ranitidine (ZANTAC) 150 MG tablet Take 150 mg by mouth as needed for heartburn.     No current facility-administered medications on file prior to visit.    Allergies  Allergen Reactions  . Nickel Rash    Review of Systems  Constitutional: Negative for fever, chills and unexpected weight change.  Respiratory: Positive for cough (dry) and shortness of breath (acture onset this late morning).   Cardiovascular: Negative for chest pain.  Genitourinary: Positive for frequency.  Allergic/Immunologic: Positive for environmental allergies.   Objective:  BP 132/68 mmHg  Pulse 88  Temp(Src) 98.4 F (36.9 C) (Oral)  Resp 18  Ht 6' (1.829 m)  Wt 153 lb 6.4 oz (69.582 kg)  BMI 20.80 kg/m2  SpO2 97%  Physical Exam  Constitutional: She is oriented to person, place, and time and well-developed, well-nourished, and in no distress.  HENT:  Head: Normocephalic and atraumatic.  Right Ear: Hearing and external  ear normal.  Left Ear: Hearing and external ear normal.  Eyes: Conjunctivae are normal.  Neck: Normal range of motion. Neck supple.  Cardiovascular: Normal rate, regular rhythm and normal heart sounds.   No murmur heard. Pulmonary/Chest: Effort normal and breath sounds normal. She has no wheezes. She exhibits no tenderness.  Abdominal: Soft. Bowel sounds are normal.  Neurological: She is alert and oriented to person, place, and time. Gait normal.  Skin: Skin is warm and dry.  Psychiatric: Mood, memory, affect and judgment normal.  Vitals reviewed.  EKG - slight St depression in aVF and V3 - d/w Dr Lorelei Pont Assessment and Plan :  SOB (shortness of breath) - Plan: EKG 12-Lead, TB Skin Test  Cough - Plan: TB Skin Test, mometasone (NASONEX) 50 MCG/ACT nasal spray - we will  add an allergy medication and she will increase her Zantac 150 to bid for at least 2 weeks to see if control of her reflux will help with her night time cough.  Nonspecific abnormal electrocardiogram (ECG) (EKG) - slight change from 2008 EKG - due to age and acute onset of SOB without resolution she needs to be ruled out from a cardiac perspective.  Due to use sending her to ED by EMS we will let them do her CXR at the ED.  A PPD was placed due to her concern from possible exposure though she is having no other symptoms of TB she will feel more comfortable knowing her results.  Urinary frequency - to be dealt with in the future - gave her [facility] name she stated that she does not need a referral - she will let me know if she has problems.  Left shoulder pain - to be dealt with at a later time - this has been long going and does not seem to be related to her current SOB.  Windell Hummingbird PA-C  [facility] 01/12/2015 4:15 PM

## 2015-01-12 NOTE — ED Notes (Signed)
Pt was at the beauty shop getting her hair done and began to have anxiety with SOB. Pt went to [facility] for further eval.  [facility] sent her to ED b/c of Left Shoulder pain and SOB.  Per EMS VS are as follows: BP: 160/92 HR: 70 Resp:18 SPO2:99% on RA

## 2015-01-12 NOTE — Discharge Instructions (Signed)
Your work-up today was normal. Follow-up with cardiology. Return here for any new/worsening symptoms.

## 2015-01-14 ENCOUNTER — Ambulatory Visit (INDEPENDENT_AMBULATORY_CARE_PROVIDER_SITE_OTHER): Payer: Medicare PPO | Admitting: Physician Assistant

## 2015-01-14 DIAGNOSIS — Z7689 Persons encountering health services in other specified circumstances: Secondary | ICD-10-CM

## 2015-01-14 DIAGNOSIS — Z111 Encounter for screening for respiratory tuberculosis: Secondary | ICD-10-CM

## 2015-01-14 LAB — TB SKIN TEST
Induration: 0 mm
TB SKIN TEST: NEGATIVE

## 2015-01-14 NOTE — Progress Notes (Signed)
Pt here for tb read

## 2015-02-23 DIAGNOSIS — M7541 Impingement syndrome of right shoulder: Secondary | ICD-10-CM | POA: Diagnosis not present

## 2015-03-01 ENCOUNTER — Encounter: Payer: Self-pay | Admitting: Interventional Cardiology

## 2015-03-01 ENCOUNTER — Ambulatory Visit (INDEPENDENT_AMBULATORY_CARE_PROVIDER_SITE_OTHER): Payer: Medicare PPO | Admitting: Interventional Cardiology

## 2015-03-01 VITALS — BP 119/60 | HR 80 | Ht 67.0 in | Wt 153.0 lb

## 2015-03-01 DIAGNOSIS — R42 Dizziness and giddiness: Secondary | ICD-10-CM

## 2015-03-01 DIAGNOSIS — R0602 Shortness of breath: Secondary | ICD-10-CM | POA: Diagnosis not present

## 2015-03-01 DIAGNOSIS — M25512 Pain in left shoulder: Secondary | ICD-10-CM | POA: Diagnosis not present

## 2015-03-01 NOTE — Addendum Note (Signed)
Addended by: Stanton Kidney on: 03/01/2015 11:32 AM   Modules accepted: Orders

## 2015-03-01 NOTE — Progress Notes (Signed)
No PCP in system to route PCP report to.

## 2015-03-01 NOTE — Patient Instructions (Signed)
Medication Instructions:  Your physician recommends that you continue on your current medications as directed. Please refer to the Current Medication list given to you today.  Labwork: None ordered  Testing/Procedures: Your physician has requested that you have an exercise tolerance test. For further information please visit [email or web address]. Please also follow instruction sheet, as given.  Your physician has requested that you have an echocardiogram. Echocardiography is a painless test that uses sound waves to create images of your heart. It provides your doctor with information about the size and shape of your heart and how well your heart's chambers and valves are working. This procedure takes approximately one hour. There are no restrictions for this procedure.  Follow-Up: No follow up is needed at this time with Dr. Irish Lack.  He will see you on an as needed basis.    Any Other Special Instructions Will Be Listed Below (If Applicable). Thank you for choosing [facility]!!

## 2015-03-01 NOTE — Progress Notes (Signed)
Patient ID: Sheila Anderson, female   DOB: 07-26-1940, 74 y.o.   MRN: [id number]     Cardiology Office Note   Date:  03/01/2015   ID:  Sheila Anderson, DOB 08-Jun-1941, MRN [id number]  PCP:  No PCP Per Patient    Chief Complaint  Patient presents with  . [place]     Wt Readings from Last 3 Encounters:  03/01/15 153 lb (69.4 kg)  01/12/15 153 lb 6.4 oz (69.582 kg)  07/31/13 155 lb (70.308 kg)       History of Present Illness: Sheila Anderson is a 74 y.o. female  Who has a h/o GERD.  She has a h/o feeling lightheaded after eating fast.  She wsa getting her hair dried at the beauty shop.  She felt hot and turned down the drier.  She still felt hot.  She felt llightheaded.  She did not pass out.  No change with standing.  Not close to passing out.  In the past, she thinks that she had lightheadedness in the past after eating fast, particularly french fries.  She has not had a cardiac eval in the past.  She went to ER after the beauty shop episode.  She had a negative w/u in the ER.  She was sent home.  They thought it may be anxiety per her report.  Earlier that day, she had left shoulder pain. Not related to troponin. She ruled out for MI.  Normal troponin and CXR.  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    Chronic SHOB when she lies flat, but she thinks this may be anxiety.  Since that time, no concerning episodes.  She had a mild episode when eating fast before a restaurant was about to close. Not as bad a s the episode which took her to the ER.   Father had severe valuvar heart disease.      Past Medical History  Diagnosis Date  . Allergy   . Neuromuscular disorder   . Cataract   . Anxiety   . Anxiety     Past Surgical History  Procedure Laterality Date  . Eye surgery    . Cyst removal from bladder       Current Outpatient Prescriptions  Medication Sig Dispense Refill  . Ascorbic Acid (VITAMIN C PO) Take 1 tablet by mouth as needed.    . cetirizine  (ZYRTEC) 10 MG tablet Take 10 mg by mouth daily.    . clobetasol ointment (TEMOVATE) 0.05 % APPLY TO AFFECTED AREA SPARINGLY EVERY OTHER DAY  0  . COD LIVER OIL PO Take 1 capsule by mouth daily.    . Coenzyme Q10 (CO Q 10 PO) Take 1 tablet by mouth daily.    Donnie Aho (GLUCOSAMINE MSM COMPLEX PO) Take 1 capsule by mouth daily.    . mometasone (NASONEX) 50 MCG/ACT nasal spray Place 2 sprays into the nose daily. 17 g 12  . Multiple Vitamins-Minerals (ICAPS AREDS 2 PO) Take 1 capsule by mouth daily.    . Multiple Vitamins-Minerals (ONE-A-DAY WOMENS 50 PLUS PO) Take 1 tablet by mouth daily.    . Multiple Vitamins-Minerals (PRESERVISION AREDS 2 PO) Take 1 tablet by mouth daily.    . ranitidine (ZANTAC) 150 MG tablet Take 150 mg by mouth as needed for heartburn.    . Soy Protein-Soy Isoflavone (SOY CARE MENOPAUSE PO) Take 1 tablet by mouth 2 (two) times daily.    Marland Kitchen VITAMIN E PO Take 1 capsule  by mouth as needed.     No current facility-administered medications for this visit.    Allergies:   Nickel    Social History:  The patient  reports that she has never smoked. She does not have any smokeless tobacco history on file. She reports that she drinks alcohol. She reports that she does not use illicit drugs.   Family History:  The patient's *family history includes Congestive Heart Failure in her father; Heart disease in her mother; Heart failure in her father; Hypertension in her mother; Multiple sclerosis in her sister; Stroke in her mother. Father with valvular heart disease.   ROS:  Please see the history of present illness.   Otherwise, review of systems are positive for .   All other systems are reviewed and negative.    PHYSICAL EXAM: VS:  BP 119/60 mmHg  Pulse 80  Ht 5\' 7"  (1.702 m)  Wt 153 lb (69.4 kg)  BMI 23.96 kg/m2 , BMI Body mass index is 23.96 kg/(m^2). GEN: Well nourished, well developed, in no acute distress HEENT: normal Neck: no JVD, carotid bruits,  or masses Cardiac: RRR; no murmurs, rubs, or gallops,no edema  Respiratory:  clear to auscultation bilaterally, normal work of breathing GI: soft, nontender, nondistended, + BS MS: no deformity or atrophy Skin: warm and dry, no rash Neuro:  Strength and sensation are intact Psych: euthymic mood, full affect   EKG:   The ekg ordered in ER demonstrates normal   Recent Labs: 01/12/2015: BUN 11; Creatinine, Ser 0.68; Hemoglobin 13.4; Platelets 253; Potassium 3.7; Sodium 141   Lipid Panel No results found for: CHOL, TRIG, HDL, CHOLHDL, VLDL, LDLCALC, LDLDIRECT   Other studies Reviewed: Additional studies/ records that were reviewed today with results demonstrating: Negative CXR and troponin in the ER.   ASSESSMENT AND PLAN:  1. Lightheadedness: Sounds like a vagal type episode. It seems to stop before she actually passes out. She has some mild shortness of breath when lying flat. We'll check echocardiogram to evaluate for any structural heart disease.  2. Plan for exercise treadmill test to evaluate exercise capacity. She really does not do much in the way of exercise.  Left shoulder pain was somewhat atypical. Not related to exertion, although she does not do much exercise. 3. I have encouraged her to try to stay well hydrated to avoid the lightheadedness symptoms. If she does get lightheaded, she should avoid trying to stand up and walk. She should try to sit down or lie down to avoid any passing out spells. She has not had any passing out spells in the past as of yet. She seems to manage this fairly well.   Current medicines are reviewed at length with the patient today.  The patient concerns regarding her medicines were addressed.  The following changes have been made:  No change  Labs/ tests ordered today include:   Orders Placed This Encounter  Procedures  . EKG 12-Lead    Recommend 150 minutes/week of aerobic exercise Low fat, low carb, high fiber diet  recommended  Disposition:   FU in prn   Teresita Madura., MD  03/01/2015 11:28 AM    [facility] [address], [place], [place]  [address] Phone: [phone]; Fax: [phone]

## 2015-03-05 ENCOUNTER — Ambulatory Visit (INDEPENDENT_AMBULATORY_CARE_PROVIDER_SITE_OTHER): Payer: Medicare PPO | Admitting: Ophthalmology

## 2015-03-05 DIAGNOSIS — H35372 Puckering of macula, left eye: Secondary | ICD-10-CM | POA: Diagnosis not present

## 2015-03-05 DIAGNOSIS — H3531 Nonexudative age-related macular degeneration: Secondary | ICD-10-CM

## 2015-03-05 DIAGNOSIS — H43811 Vitreous degeneration, right eye: Secondary | ICD-10-CM | POA: Diagnosis not present

## 2015-03-15 ENCOUNTER — Ambulatory Visit (INDEPENDENT_AMBULATORY_CARE_PROVIDER_SITE_OTHER): Payer: Medicare PPO

## 2015-03-15 ENCOUNTER — Ambulatory Visit (INDEPENDENT_AMBULATORY_CARE_PROVIDER_SITE_OTHER): Payer: Medicare PPO | Admitting: Family Medicine

## 2015-03-15 VITALS — BP 130/62 | HR 76 | Temp 99.0°F | Resp 20 | Ht 66.5 in | Wt 149.8 lb

## 2015-03-15 DIAGNOSIS — M25561 Pain in right knee: Secondary | ICD-10-CM

## 2015-03-15 DIAGNOSIS — M79651 Pain in right thigh: Secondary | ICD-10-CM

## 2015-03-15 MED ORDER — NAPROXEN 375 MG PO TABS: 375.0000 mg | ORAL_TABLET | Freq: Two times a day (BID) | ORAL | Status: DC

## 2015-03-15 NOTE — Patient Instructions (Signed)
Tylenol as needed every 8 hours.  I'm referring to physical therapy for further care of this problem.

## 2015-03-15 NOTE — Progress Notes (Signed)
03/15/2015 at 9:52 PM  Sheila Anderson / DOB: 03-08-1941 / MRN: [id number]  The patient has Left shoulder pain on her problem list.  SUBJECTIVE  Sheila Anderson is a 74 y.o. well appearing female presenting for the chief complaint of 1.5 weeks of right knee, posterior leg and hip pain. Denies any trauma to the legs and denies a history of back pain.  Describes the pain as a dull ache worse with ambulation.  Has tried Naprosyn bid for a few days with minimal relief.  She would like radiographs of the affected joints to rule out arthritis.  She denies a history of kidney disease and PUD.     She  has a past medical history of Allergy; Neuromuscular disorder ([place]); Cataract; Anxiety; and Anxiety.    Medications reviewed and updated by myself where necessary, and exist elsewhere in the encounter.   Sheila Anderson is allergic to nickel. She  reports that she has never smoked. She does not have any smokeless tobacco history on file. She reports that she drinks alcohol. She reports that she does not use illicit drugs. She  reports that she currently engages in sexual activity. The patient  has past surgical history that includes Eye surgery and cyst removal from bladder.  Her family history includes Congestive Heart Failure in her father; Heart disease in her mother; Heart failure in her father; Hypertension in her mother; Multiple sclerosis in her sister; Stroke in her mother.  Review of Systems  Constitutional: Negative for fever and chills.  Respiratory: Negative for shortness of breath.   Cardiovascular: Negative for chest pain.  Gastrointestinal: Negative for nausea and abdominal pain.  Genitourinary: Negative.   Skin: Negative for rash.  Neurological: Negative for dizziness and headaches.    OBJECTIVE  Her  height is 5' 6.5" (1.689 m) and weight is 149 lb 12.8 oz (67.949 kg). Her oral temperature is 99 F (37.2 C). Her blood pressure is 130/62 and her pulse is 76. Her respiration is 20 and  oxygen saturation is 97%.  The patient's body mass index is 23.82 kg/(m^2).  Physical Exam  Constitutional: She is oriented to person, place, and time. She appears well-developed and well-nourished. No distress.  Eyes: Pupils are equal, round, and reactive to light.  Cardiovascular: Normal rate.   Respiratory: Effort normal. No respiratory distress.  GI: She exhibits no distension.  Musculoskeletal:       Left hip: She exhibits tenderness (posteriolateral). She exhibits normal range of motion, normal strength, no bony tenderness, no swelling, no crepitus, no deformity and no laceration.       Left knee: She exhibits normal range of motion, no swelling, no effusion, no ecchymosis, no deformity, no erythema, normal alignment, no LCL laxity, normal patellar mobility, normal meniscus and no MCL laxity. Tenderness found. Lateral joint line and LCL tenderness noted. No medial joint line, no MCL and no patellar tendon tenderness noted.       Legs: Neurological: She is alert and oriented to person, place, and time. She has normal strength. She displays no atrophy. No sensory deficit. She exhibits normal muscle tone. She displays a negative Romberg sign. Coordination and gait normal.  Skin: Skin is warm and dry. She is not diaphoretic.  Psychiatric: She has a normal mood and affect. Her behavior is normal. Judgment and thought content normal.    [facility] reading (PRIMARY) by  Dr. Brigitte Pulse: Hip Negative  [facility] reading (PRIMARY) by  Dr. Brigitte Pulse: Joint space preserved.  Mild joint  line sclerosis and spurring noted.  No acute abnormalities.    No results found for this or any previous visit (from the past 24 hour(s)).  ASSESSMENT & PLAN  Sheila Anderson was seen today for knee pain and leg pain.  Diagnoses and all orders for this visit:  Right thigh pain: Her pain is most likely secondary to IT band syndrome or less likely patellofemoral syndrome.  Her knee and hip exam are normal and she has lateral soft tissue  tenderness along the IT band.  Advised PT eval and treat and NSAID for now.  Can step up therapy to prednisone if no improvement in roughly two weeks. No history of kidney disease or PUD.    -     DG HIP UNILAT W OR W/O PELVIS 2-3 VIEWS RIGHT; Future -     naproxen (NAPROSYN) 375 MG tablet; Take 1 tablet (375 mg total) by mouth 2 (two) times daily with a meal. -     Ambulatory referral to Physical Therapy  Right knee pain -     DG Knee Complete 4 Views Right; Future -     naproxen (NAPROSYN) 375 MG tablet; Take 1 tablet (375 mg total) by mouth 2 (two) times daily with a meal. -     Ambulatory referral to Physical Therapy    The patient was advised to call or come back to clinic if she does not see an improvement in symptoms, or worsens with the above plan.   Philis Fendt, MHS, PA-C [facility] 03/15/2015 9:52 PM

## 2015-03-16 NOTE — Progress Notes (Signed)
Patient ID: Sheila Anderson, female   DOB: 1941/05/31, 74 y.o.   MRN: [id number] Reviewed documentation and xray and agree w/ assessment and plan. [name], MD MPH

## 2015-03-17 ENCOUNTER — Encounter: Payer: Self-pay | Admitting: Physician Assistant

## 2015-03-17 ENCOUNTER — Other Ambulatory Visit (HOSPITAL_COMMUNITY): Payer: Self-pay

## 2015-03-19 ENCOUNTER — Telehealth: Payer: Self-pay

## 2015-03-19 ENCOUNTER — Other Ambulatory Visit: Payer: Self-pay | Admitting: Physician Assistant

## 2015-03-19 DIAGNOSIS — M79651 Pain in right thigh: Secondary | ICD-10-CM

## 2015-03-19 NOTE — Telephone Encounter (Signed)
Sheila Anderson from [facility] called stating pt is not homebound, therefore she cannot receive home health. We need to refer pt to outpatient PT.

## 2015-03-19 NOTE — Telephone Encounter (Signed)
defer to [facility] to see if you have a second preference.   [facility] and [facility] are both easy to get to, large lots, one story - should be easy for pt to navigate.

## 2015-03-19 NOTE — Telephone Encounter (Signed)
Referral sent for [facility].  Philis Fendt, MS, PA-C   1:50 PM, 03/19/2015

## 2015-03-26 ENCOUNTER — Other Ambulatory Visit: Payer: Self-pay

## 2015-03-26 ENCOUNTER — Ambulatory Visit (HOSPITAL_COMMUNITY): Payer: Medicare PPO | Attending: Interventional Cardiology

## 2015-03-26 ENCOUNTER — Other Ambulatory Visit: Payer: Self-pay | Admitting: Physician Assistant

## 2015-03-26 ENCOUNTER — Ambulatory Visit (INDEPENDENT_AMBULATORY_CARE_PROVIDER_SITE_OTHER): Payer: Medicare PPO | Admitting: Physician Assistant

## 2015-03-26 DIAGNOSIS — R0602 Shortness of breath: Secondary | ICD-10-CM

## 2015-03-26 DIAGNOSIS — R9439 Abnormal result of other cardiovascular function study: Secondary | ICD-10-CM

## 2015-03-26 DIAGNOSIS — R42 Dizziness and giddiness: Secondary | ICD-10-CM | POA: Diagnosis not present

## 2015-03-26 LAB — EXERCISE TOLERANCE TEST
CHL RATE OF PERCEIVED EXERTION: 13
CSEPEW: 3.9 METS
CSEPHR: 82 %
Exercise duration (min): 1 min
Exercise duration (sec): 38 s
MPHR: 147 {beats}/min
Peak HR: 120 {beats}/min
Rest HR: 68 {beats}/min

## 2015-03-30 ENCOUNTER — Telehealth (HOSPITAL_COMMUNITY): Payer: Self-pay | Admitting: *Deleted

## 2015-03-30 NOTE — Telephone Encounter (Signed)
Patient cancelled [facility] for 04/01/15 for the reason of husband had stroke. She will call back to r/s when she can get care for husband.Sheila Anderson, [place]

## 2015-04-01 ENCOUNTER — Encounter (HOSPITAL_COMMUNITY): Payer: Self-pay

## 2015-04-06 ENCOUNTER — Ambulatory Visit: Payer: Medicare PPO | Attending: Physician Assistant | Admitting: Physical Therapy

## 2015-04-06 DIAGNOSIS — M79604 Pain in right leg: Secondary | ICD-10-CM | POA: Diagnosis not present

## 2015-04-06 DIAGNOSIS — R29898 Other symptoms and signs involving the musculoskeletal system: Secondary | ICD-10-CM | POA: Insufficient documentation

## 2015-04-06 NOTE — Therapy (Signed)
[facility] [address] [place], [place], [address] Phone: [phone]   Fax:  [phone]  Physical Therapy Evaluation  Patient Details  Name: Sheila Anderson MRN: [id number] Date of Birth: 09-15-1940 Referring Provider: Philis Fendt  Encounter Date: 04/06/2015      PT End of Session - 04/06/15 1148    Date for PT Re-Evaluation 06/01/15      Past Medical History  Diagnosis Date  . Allergy   . Neuromuscular disorder ([place])   . Cataract   . Anxiety   . Anxiety     Past Surgical History  Procedure Laterality Date  . Eye surgery    . Cyst removal from bladder      There were no vitals filed for this visit.  Visit Diagnosis:  Pain of right lower extremity - Plan: PT plan of care cert/re-cert  Weakness of both legs - Plan: PT plan of care cert/re-cert      Subjective Assessment - 04/06/15 1112    Subjective Pt's husband had a stroke last month and pt was doing a lot of walking at the hospiital and has been taking care of husband and doing lots of stairs at home.  Pain is in Rt thigh and buttock   Pertinent History had a similar problem in L hip 1 year ago   Limitations Sitting;Walking;Standing   Patient Stated Goals decrease pain with activites   Currently in Pain? Yes   Pain Score 3    Pain Location Hip   Pain Orientation Right   Pain Descriptors / Indicators Aching   Pain Onset More than a month ago   Pain Frequency Constant   Aggravating Factors  wt bearing, walking, standing   Pain Relieving Factors meds, ice   Effect of Pain on Daily Activities decreased walking and activities outside            [facility] PT Assessment - 04/06/15 0001    Assessment   Medical Diagnosis Right thigh pain   Referring Provider Philis Fendt   Precautions   Precautions None   Balance Screen   Has the patient fallen in the past 6 months No   [place] residence   Living  Arrangements Spouse/significant other   Additional Comments split level house, pt's husband recently had a CVA and she has been taking care of him   Prior Function   Level of Independence Independent   Vocation Requirements plays cello   Cognition   Overall Cognitive Status Within Functional Limits for tasks assessed   Observation/Other Assessments   Focus on Therapeutic Outcomes (FOTO)  42% limited   Sensation   Light Touch Appears Intact   Coordination   Gross Motor Movements are Fluid and Coordinated Yes   ROM / Strength   AROM / PROM / Strength AROM;Strength   AROM   Overall AROM Comments bilat hips WFL   Strength   Overall Strength Comments hip flex R: 3/5, L: 3/5, hip abd R: 3/5, L 3/5, hip ext R;3+/5, L 3/5   Flexibility   Soft Tissue Assessment /Muscle Length --  decreased quad length R>L   Palpation   Palpation comment TTP Rt hip flexor, ITB, bilat piroformis and glut med, ? hypermobility hip joint Rt   Special Tests    Special Tests --  SLR and FABER neg bilat  PT Education - 2015/04/17 1139    Education provided Yes   Education Details PT POC, HEP   Person(s) Educated Patient   Methods Explanation;Demonstration;Handout   Comprehension Verbalized understanding;Returned demonstration          PT Short Term Goals - 2015-04-17 1143    PT SHORT TERM GOAL #1   Title Pt will be independent in intial HEP   Time 4   Period Weeks   Status New   PT SHORT TERM GOAL #2   Title Pt will increase bilat hip strength to 3+/5 bilat   Time 4   Period Weeks   Status New   PT SHORT TERM GOAL #3   Title Pt  will tolerate walking 20 minutes without increase in symptoms   Time 4   Period Weeks   Status New           PT Long Term Goals - 04-17-15 1144    PT LONG TERM GOAL #1   Title Pt will improve FOTO to <= 36% to demo improved mobility   Time 8   Period Weeks   Status New   PT LONG TERM GOAL #2   Title Pt will improve  bilat hip strength to 4/5   Time 8   Period Weeks   Status New   PT LONG TERM GOAL #3   Title Pt will walk dogs x 30 minutes without increase in symptoms   Time 8   Period Weeks   Status New   PT LONG TERM GOAL #4   Title Pt will sit for playing cello x 1 hour without increase of symptoms   Time 8   Period Weeks   Status New               Plan - 2015-04-17 1141    Clinical Impression Statement Pt presents with increased hip pain R>L, decreased hip strength bilat, increased hip pain. Pt will benefit from skilled PT to address deficits and increase functional mobility.   Pt will benefit from skilled therapeutic intervention in order to improve on the following deficits Decreased mobility;Pain;Decreased endurance;Impaired flexibility;Decreased strength;Decreased range of motion   Rehab Potential Good   PT Frequency 2x / week   PT Duration 8 weeks   PT Treatment/Interventions Manual techniques;Passive range of motion;Dry needling;Taping;Patient/family education;Neuromuscular re-education;Balance training;Therapeutic exercise;Therapeutic activities;Functional mobility training;Stair training;ADLs/Self Care Home Management;Cryotherapy;Electrical Stimulation;Iontophoresis 4mg /ml Dexamethasone;Moist Heat   PT Next Visit Plan progress hip strength and quad stretching   PT Home Exercise Plan hip strength supine/sidelying/prone, quad stretch   Consulted and Agree with Plan of Care Patient          G-Codes - 04-17-15 1111    Functional Limitation Mobility: Walking and moving around   Mobility: Walking and Moving Around Current Status [phone]) At least 40 percent but less than 60 percent impaired, limited or restricted   Mobility: Walking and Moving Around Goal Status [phone]) At least 20 percent but less than 40 percent impaired, limited or restricted       Problem List Patient Active Problem List   Diagnosis Date Noted  . Left shoulder pain 01/12/2015    Isabelle Course, PT, DPT   04-17-15, 11:49 AM  [facility] [address] [place], [place], [address] Phone: [phone]   Fax:  [phone]  Name: Sheila Anderson MRN: [id number] Date of Birth: Mar 28, 1941

## 2015-04-06 NOTE — Patient Instructions (Signed)
Hip Flexion / Knee Extension: Straight-Leg Raise (Eccentric)   Lie on back. Lift leg with knee straight. Slowly lower leg for 3-5 seconds. ___ reps per set, ___ sets per day, ___ days per week. Lower like elevator, stopping at each floor. Add ___ lbs when you achieve ___ repetitions. Rest on elbows. Rest on straight arms.  ABDUCTION: Side-Lying (Active)   Lie on left side, top leg straight. Raise top leg as far as possible. Use ___ lbs. Complete ___ sets of ___ repetitions. Perform ___ sessions per day.  http://gtsc.exer.[email or web address]   (Home) Extension: Hip   With support under abdomen, tighten stomach. Lift right leg in line with body. Do not hyperextend. Alternate legs. Repeat ____ times per set. Do ____ sets per session. Do ____ sessions per week.     Quads / HF, Prone   Lie face down, knees together. Grasp one ankle with same-side hand. Use towel if needed to reach. Gently pull foot toward buttock. Hold ___ seconds. Repeat ___ times per session. Do ___ sessions per day.   [facility] [address], [place], [place] [address] Phone # [phone] Fax [phone]

## 2015-04-07 DIAGNOSIS — M7541 Impingement syndrome of right shoulder: Secondary | ICD-10-CM | POA: Diagnosis not present

## 2015-04-08 ENCOUNTER — Encounter: Payer: Self-pay | Admitting: Physical Therapy

## 2015-04-08 ENCOUNTER — Ambulatory Visit: Payer: Medicare PPO | Admitting: Physical Therapy

## 2015-04-08 DIAGNOSIS — M79604 Pain in right leg: Secondary | ICD-10-CM

## 2015-04-08 DIAGNOSIS — R29898 Other symptoms and signs involving the musculoskeletal system: Secondary | ICD-10-CM | POA: Diagnosis not present

## 2015-04-08 NOTE — Patient Instructions (Signed)
Chair Sitting    Sit at edge of seat, spine straight, one leg extended, and foot toward head. Put a hand on each thigh and bend forward from the hip, keeping spine straight. Allow hand on extended leg to reach toward toes. Support upper body with other arm. Hold _30__ seconds. Repeat _2__ times per session. Do __1_ sessions per day.  Copyright  VHI. All rights reserved.  Piriformis Stretch, Sitting    Sit, one ankle on opposite knee, same-side hand on crossed knee. Push down on knee, keeping spine straight. Lean torso forward, with flat back, until tension is felt in hamstrings and gluteals of crossed-leg side. Hold _30__ seconds.  Repeat _2__ times per session. Do __1_ sessions per day.  Copyright  VHI. All rights reserved.    Quads / HF, Supine    Lie near edge of bed, one leg bent, foot flat on bed. Other leg hanging over edge, relaxed, thigh resting entirely on bed. Bend hanging knee backward keeping thigh in contact with bed. Hold _30__ seconds.  Repeat __2_ times per session. Do _1__ sessions per day.  Copyright  VHI. All rights reserved.  [place] [address], [place], [place] [address] Phone # [phone] Fax [phone]

## 2015-04-08 NOTE — Therapy (Addendum)
[facility] [address] [place], [place], [address] Phone: [phone]   Fax:  [phone]  Physical Therapy Treatment  Patient Details  Name: Sheila Anderson MRN: [id number] Date of Birth: September 25, 1940 Referring Provider: Philis Fendt  Encounter Date: 04/08/2015      PT End of Session - 04/08/15 1149    Visit Number 2   Number of Visits 10  Medicare   Date for PT Re-Evaluation 06/01/15   Authorization Type Ortho net   Authorization Time Period 10/25-12/02/2015   Authorization - Visit Number 2   Authorization - Number of Visits 6   PT Start Time 1150   PT Stop Time 1228   PT Time Calculation (min) 38 min   Activity Tolerance Patient tolerated treatment well   Behavior During Therapy [facility] for tasks assessed/performed      Past Medical History  Diagnosis Date  . Allergy   . Neuromuscular disorder ([place])   . Cataract   . Anxiety   . Anxiety     Past Surgical History  Procedure Laterality Date  . Eye surgery    . Cyst removal from bladder      There were no vitals filed for this visit.  Visit Diagnosis:  Pain of right lower extremity  Weakness of both legs      Subjective Assessment - 04/08/15 1151    Subjective I felt fine after the evaluation. The exercises I did I got pain on left calf.    Pertinent History had a similar problem in L hip 1 year ago   Limitations Sitting;Walking;Standing   Patient Stated Goals decrease pain with activites   Currently in Pain? Yes   Pain Score 2    Pain Location Hip  lateral    Pain Orientation Right   Pain Descriptors / Indicators Aching   Pain Onset More than a month ago   Pain Frequency Constant   Aggravating Factors  wt. bearing, walking, standing   Pain Relieving Factors meds, ice   Effect of Pain on Daily Activities decreased walking and activities                         [facility] Adult PT Treatment/Exercise - 04/08/15 0001    Knee/Hip Exercises: Aerobic   Nustep level 1 5 min   Knee/Hip Exercises: Supine   Straight Leg Raises Right;Left;10 reps  abdominal brace, not go above opposite knee   Knee/Hip Exercises: Sidelying   Hip ABduction Right;Left;10 reps  abdominal brace, technique   Knee/Hip Exercises: Prone   Straight Leg Raises Left;Right;10 reps  pillow under hips   Manual Therapy   Manual Therapy Soft tissue mobilization   Soft tissue mobilization soft tissue work to right gluteal  and right iliotibial band                PT Education - 04/08/15 1215    Education provided Yes   Education Details flexibility exercises; how to use rolling pin to massage iliotibial band and piriformis; use a tennis ball for massaging muscle   Person(s) Educated Patient   Methods Explanation;Demonstration;Handout;Verbal cues   Comprehension Returned demonstration;Verbalized understanding          PT Short Term Goals - 04/06/15 1143    PT SHORT TERM GOAL #1   Title Pt will be independent in intial HEP   Time 4   Period Weeks   Status New   PT SHORT TERM GOAL #2  Title Pt will increase bilat hip strength to 3+/5 bilat   Time 4   Period Weeks   Status New   PT SHORT TERM GOAL #3   Title Pt  will tolerate walking 20 minutes without increase in symptoms   Time 4   Period Weeks   Status New           PT Long Term Goals - 04/06/15 1144    PT LONG TERM GOAL #1   Title Pt will improve FOTO to <= 36% to demo improved mobility   Time 8   Period Weeks   Status New   PT LONG TERM GOAL #2   Title Pt will improve bilat hip strength to 4/5   Time 8   Period Weeks   Status New   PT LONG TERM GOAL #3   Title Pt will walk dogs x 30 minutes without increase in symptoms   Time 8   Period Weeks   Status New   PT LONG TERM GOAL #4   Title Pt will sit for playing cello x 1 hour without increase of symptoms   Time 8   Period Weeks   Status New               Plan - 04/08/15 1227     Clinical Impression Statement Patient was instructed on how to perform exercises correctly. Patient has tightness in right gluteal, right piriformis, right iliotibial band. Patient need instruction on how to perform exercises correctly.  Patient just had the initial evaluation  therefore has not met goals. Patient will benefit from physical therapy to reduce pain and increase right hip strenght.    Pt will benefit from skilled therapeutic intervention in order to improve on the following deficits Decreased mobility;Pain;Decreased endurance;Impaired flexibility;Decreased strength;Decreased range of motion   Rehab Potential Good   Clinical Impairments Affecting Rehab Potential None   PT Frequency 2x / week   PT Duration 8 weeks   PT Treatment/Interventions Manual techniques;Passive range of motion;Dry needling;Taping;Patient/family education;Neuromuscular re-education;Balance training;Therapeutic exercise;Therapeutic activities;Functional mobility training;Stair training;ADLs/Self Care Home Management;Cryotherapy;Electrical Stimulation;Iontophoresis 4mg /ml Dexamethasone;Moist Heat   PT Next Visit Plan progress hip strength and quad stretching; soft tissue work to right iliotibial band and gluteal, ionto if MD signs note   PT Home Exercise Plan hip strength supine/sidelying/prone, quad stretch   Consulted and Agree with Plan of Care Patient        Problem List Patient Active Problem List   Diagnosis Date Noted  . Left shoulder pain 01/12/2015    Sheila Anderson,PT 04/08/2015, 12:35 PM  [facility] [address] [place], [place], [address] Phone: [phone]   Fax:  [phone]  Name: Sheila Anderson MRN: [id number] Date of Birth: 11-Apr-1941

## 2015-04-13 ENCOUNTER — Ambulatory Visit: Payer: Medicare PPO | Attending: Physician Assistant | Admitting: Physical Therapy

## 2015-04-13 ENCOUNTER — Encounter: Payer: Self-pay | Admitting: Physical Therapy

## 2015-04-13 DIAGNOSIS — R29898 Other symptoms and signs involving the musculoskeletal system: Secondary | ICD-10-CM | POA: Insufficient documentation

## 2015-04-13 DIAGNOSIS — M79604 Pain in right leg: Secondary | ICD-10-CM

## 2015-04-13 NOTE — Therapy (Signed)
[facility] [address], [place], [place], [address] Phone: [phone]   Fax:  [phone]  Physical Therapy Treatment  Patient Details  Name: Sheila Anderson MRN: [id number] Date of Birth: 04/18/1941 Referring Provider: Philis Fendt  Encounter Date: 04/13/2015      PT End of Session - 04/13/15 1024    Visit Number 3   Number of Visits 10   Date for PT Re-Evaluation 06/01/15   Authorization Type Ortho net   Authorization Time Period 10/25-12/02/2015   Authorization - Visit Number 3   Authorization - Number of Visits 6   PT Start Time 0930   PT Stop Time 1015   PT Time Calculation (min) 45 min   Activity Tolerance Patient tolerated treatment well   Behavior During Therapy [facility] for tasks assessed/performed      Past Medical History  Diagnosis Date  . Allergy   . Neuromuscular disorder ([place])   . Cataract   . Anxiety   . Anxiety     Past Surgical History  Procedure Laterality Date  . Eye surgery    . Cyst removal from bladder      There were no vitals filed for this visit.  Visit Diagnosis:  Pain of right lower extremity  Weakness of both legs      Subjective Assessment - 04/13/15 0946    Subjective Pt reports feeling the discomfort in right LE located on tuber ischiadicum and prox lat calf rated as 2/10 up to 6/10   Patient is accompained by: Family member   Currently in Pain? Yes   Pain Score 2    Pain Location Hip   Pain Orientation Right   Pain Onset More than a month ago   Pain Frequency Constant   Multiple Pain Sites No                         [facility] Adult PT Treatment/Exercise - 04/13/15 0001    Exercises   Exercises Lumbar   Lumbar Exercises: Stretches   Single Knee to Chest Stretch 3 reps;20 seconds  diagonal knee to chest, sitting left LE over Rt knee x 3 eac   Piriformis Stretch 3 reps;20 seconds  each leg   Active Hamstring Stretch 3 reps;20 seconds  in  sitting, each leg   Lumbar Exercises: Seated   Other Seated Lumbar Exercises piriformis stretch in sitting   Knee/Hip Exercises: Aerobic   Nustep level 1 x6 min, unable to perform more  seat #7, arms #9   Manual Therapy   Manual Therapy Soft tissue mobilization   Soft tissue mobilization soft tissue work to right gluteal  and right iliotibial band                PT Education - 04/13/15 1024    Education provided Yes   Education Details prifformis stretch in various positions    Person(s) Educated Patient   Methods Explanation;Demonstration;Handout   Comprehension Returned demonstration;Verbalized understanding;Verbal cues required          PT Short Term Goals - 04/13/15 1033    PT SHORT TERM GOAL #1   Title Pt will be independent in intial HEP   Time 4   Period Weeks   Status On-going   PT SHORT TERM GOAL #2   Title Pt will increase bilat hip strength to 3+/5 bilat   Time 4   Period Weeks   Status On-going   PT SHORT  TERM GOAL #3   Title Pt  will tolerate walking 20 minutes without increase in symptoms   Time 4   Period Weeks   Status On-going           PT Long Term Goals - 04/13/15 1034    PT LONG TERM GOAL #1   Title Pt will improve FOTO to <= 36% to demo improved mobility   Time 8   Period Weeks   Status On-going   PT LONG TERM GOAL #2   Title Pt will improve bilat hip strength to 4/5   Time 8   Period Weeks   Status On-going   PT LONG TERM GOAL #3   Title Pt will walk dogs x 30 minutes without increase in symptoms   Time 8   Period Weeks   Status On-going   PT LONG TERM GOAL #4   Title Pt will sit for playing cello x 1 hour without increase of symptoms   Time 8   Period Weeks   Status On-going               Plan - 04/13/15 1026    Clinical Impression Statement Pt with good performance and understandding of stretching. Pt continues with right gluteal, piriformis and ITB. Patient is progressing toward all goals and will continue  to benefit from skilled PT   Pt will benefit from skilled therapeutic intervention in order to improve on the following deficits Decreased mobility;Pain;Decreased endurance;Impaired flexibility;Decreased strength;Decreased range of motion   Rehab Potential Good   PT Frequency 2x / week   PT Duration 8 weeks   PT Treatment/Interventions Manual techniques;Passive range of motion;Dry needling;Taping;Patient/family education;Neuromuscular re-education;Balance training;Therapeutic exercise;Therapeutic activities;Functional mobility training;Stair training;ADLs/Self Care Home Management;Cryotherapy;Electrical Stimulation;Iontophoresis 4mg /ml Dexamethasone;Moist Heat   PT Next Visit Plan review of piriformis stretch, continue with strength, soft tissue work to right iliotibial band and gluteal, check if MD signed for ionto   PT Home Exercise Plan piiriformis stretch variations in supine and sitting   Consulted and Agree with Plan of Care Patient        Problem List Patient Active Problem List   Diagnosis Date Noted  . Left shoulder pain 01/12/2015    NAUMANN-HOUEGNIFIO,Shervon Kerwin PTA 04/13/2015, 10:38 AM  [facility] [address] [place], [place], [address] Phone: [phone]   Fax:  [phone]  Name: RAMATOULAYE PACK MRN: [id number] Date of Birth: 1941-05-22

## 2015-04-13 NOTE — Patient Instructions (Signed)
Piriformis (Supine)  Cross legs, right on top. Gently pull other knee toward chest until stretch is felt in buttock/hip of right  leg. Hold   20 seconds. Repeat  3 times per set. Do  2-3  sets per session. Do  2-3  sessions per day.  Hip Stretch  Put right ankle over left knee. Let right knee fall downward, but keep ankle in place. Feel the stretch in hip. May push down gently with hand to feel stretch. Hold 20  seconds while counting out loud. Repeat with other leg. Repeat ____ times. Do ____ sessions per day.  Stretching: Piriformis   Cross left leg over other thigh and place elbow over outside of knee. Gently stretch buttock muscles by pushing bent knee across body. Hold  20 seconds or even longer. Repeat  3  times per set.  Do  2-3 sessions per day.  Stretching: Piriformis (Supine)  Pull right knee toward opposite shoulder. Hold   20 seconds. Relax. Repeat  3 times per set.. Do  2- 3 sessions per day.   Copyright  VHI. All rights reserved.

## 2015-04-15 ENCOUNTER — Encounter: Payer: Self-pay | Admitting: Physical Therapy

## 2015-04-15 ENCOUNTER — Ambulatory Visit: Payer: Medicare PPO | Admitting: Physical Therapy

## 2015-04-15 DIAGNOSIS — M79604 Pain in right leg: Secondary | ICD-10-CM

## 2015-04-15 DIAGNOSIS — R29898 Other symptoms and signs involving the musculoskeletal system: Secondary | ICD-10-CM | POA: Diagnosis not present

## 2015-04-15 NOTE — Patient Instructions (Signed)

## 2015-04-15 NOTE — Therapy (Signed)
[facility] [address] [place], [place], [address] Phone: [phone]   Fax:  [phone]  Physical Therapy Treatment  Patient Details  Name: Sheila Anderson MRN: [id number] Date of Birth: [birth year] Referring Provider: Philis Fendt  Encounter Date: 04/15/2015      PT End of Session - 04/15/15 1206    Visit Number 4   Number of Visits 10   Date for PT Re-Evaluation 06/01/15   Authorization Type Ortho net   Authorization Time Period 10/25-12/02/2015   Authorization - Visit Number 4   Authorization - Number of Visits 6   PT Start Time 1146   PT Stop Time 1230   PT Time Calculation (min) 44 min   Activity Tolerance Patient tolerated treatment well   Behavior During Therapy [facility] for tasks assessed/performed      Past Medical History  Diagnosis Date  . Allergy   . Neuromuscular disorder ([place])   . Cataract   . Anxiety   . Anxiety     Past Surgical History  Procedure Laterality Date  . Eye surgery    . Cyst removal from bladder      There were no vitals filed for this visit.  Visit Diagnosis:  Pain of right lower extremity  Weakness of both legs      Subjective Assessment - 04/15/15 1157    Subjective Pt reports prox lat calf is better, but feels soreness in quadriceps and hamstrings right LE   Patient is accompained by: Family member   Pertinent History had a similar problem in L hip 1 year ago   Patient Stated Goals decrease pain with activites   Currently in Pain? Yes   Pain Score 2    Pain Location Hip   Pain Orientation Right   Pain Descriptors / Indicators Aching   Pain Onset More than a month ago   Pain Frequency Constant   Multiple Pain Sites No                         [facility] Adult PT Treatment/Exercise - 04/15/15 0001    Exercises   Exercises Lumbar   Lumbar Exercises: Stretches   Single Knee to Chest Stretch 3 reps;20 seconds  , sitting left LE over Rt LE    Piriformis Stretch 3 reps;20 seconds   Active Hamstring Stretch 3 reps;20 seconds  in sitting each leg   Lumbar Exercises: Seated   Other Seated Lumbar Exercises piriformis stretch in sitting  and supine   Knee/Hip Exercises: Aerobic   Nustep level 1 x8 min, unable to perform more   Modalities   Modalities Iontophoresis   Iontophoresis   Type of Iontophoresis Dexamethasone   Location Rt hip area   Dose 21ml   Time 6hour patch   Manual Therapy   Manual Therapy Soft tissue mobilization   Soft tissue mobilization soft tissue work to right gluteal  and right iliotibial band                PT Education - 04/15/15 1231    Education provided Yes   Education Details iontophoresis precoutions   Person(s) Educated Patient   Methods Explanation;Demonstration;Handout   Comprehension Returned demonstration;Verbalized understanding          PT Short Term Goals - 04/13/15 1033    PT SHORT TERM GOAL #1   Title Pt will be independent in intial HEP   Time 4   Period Weeks  Status On-going   PT SHORT TERM GOAL #2   Title Pt will increase bilat hip strength to 3+/5 bilat   Time 4   Period Weeks   Status On-going   PT SHORT TERM GOAL #3   Title Pt  will tolerate walking 20 minutes without increase in symptoms   Time 4   Period Weeks   Status On-going           PT Long Term Goals - 04/13/15 1034    PT LONG TERM GOAL #1   Title Pt will improve FOTO to <= 36% to demo improved mobility   Time 8   Period Weeks   Status On-going   PT LONG TERM GOAL #2   Title Pt will improve bilat hip strength to 4/5   Time 8   Period Weeks   Status On-going   PT LONG TERM GOAL #3   Title Pt will walk dogs x 30 minutes without increase in symptoms   Time 8   Period Weeks   Status On-going   PT LONG TERM GOAL #4   Title Pt will sit for playing cello x 1 hour without increase of symptoms   Time 8   Period Weeks   Status On-going               Plan - 04/15/15 1207     Clinical Impression Statement Pt with good performance and understanding of stretching. Pt is progressing toward all goals and will continue to benefit from skilled PT to lear proper stretching techniques   Pt will benefit from skilled therapeutic intervention in order to improve on the following deficits Decreased mobility;Pain;Decreased endurance;Impaired flexibility;Decreased strength;Decreased range of motion   Rehab Potential Good   PT Frequency 2x / week   PT Duration 8 weeks   PT Next Visit Plan Ionto #2 to Rt hip area posterior. Continue with stretching hip and lumbar   Consulted and Agree with Plan of Care Patient        Problem List Patient Active Problem List   Diagnosis Date Noted  . Left shoulder pain 01/12/2015    NAUMANN-HOUEGNIFIO,Ivelis Norgard PTA 04/15/2015, 12:36 PM  [facility] [address] [place], [place], [address] Phone: [phone]   Fax:  [phone]  Name: Sheila Anderson MRN: [id number] Date of Birth: 1940-09-11

## 2015-04-16 ENCOUNTER — Encounter (INDEPENDENT_AMBULATORY_CARE_PROVIDER_SITE_OTHER): Payer: Medicare PPO | Admitting: Ophthalmology

## 2015-04-16 DIAGNOSIS — M25511 Pain in right shoulder: Secondary | ICD-10-CM | POA: Diagnosis not present

## 2015-04-16 DIAGNOSIS — H353112 Nonexudative age-related macular degeneration, right eye, intermediate dry stage: Secondary | ICD-10-CM

## 2015-04-16 DIAGNOSIS — H353121 Nonexudative age-related macular degeneration, left eye, early dry stage: Secondary | ICD-10-CM

## 2015-04-16 DIAGNOSIS — H43813 Vitreous degeneration, bilateral: Secondary | ICD-10-CM

## 2015-04-16 DIAGNOSIS — M7541 Impingement syndrome of right shoulder: Secondary | ICD-10-CM | POA: Diagnosis not present

## 2015-04-19 ENCOUNTER — Ambulatory Visit: Payer: Medicare PPO | Admitting: Physical Therapy

## 2015-04-19 ENCOUNTER — Encounter: Payer: Self-pay | Admitting: Physical Therapy

## 2015-04-19 DIAGNOSIS — M79604 Pain in right leg: Secondary | ICD-10-CM | POA: Diagnosis not present

## 2015-04-19 DIAGNOSIS — R29898 Other symptoms and signs involving the musculoskeletal system: Secondary | ICD-10-CM | POA: Diagnosis not present

## 2015-04-19 NOTE — Therapy (Signed)
[facility] [address] [place], [place], [address] Phone: [phone]   Fax:  [phone]  Physical Therapy Treatment  Patient Details  Name: Sheila Anderson MRN: [id number] Date of Birth: January 06, 1941 Referring Provider: Dr. Philis Fendt  Encounter Date: 04/19/2015      PT End of Session - 04/19/15 0938    Visit Number 5   Number of Visits 10  medicare   Authorization Type Ortho net   Authorization Time Period 10/25-12/02/2015   Authorization - Visit Number 5   Authorization - Number of Visits 6   PT Start Time 0935   PT Stop Time 1015   PT Time Calculation (min) 40 min   Activity Tolerance Patient tolerated treatment well   Behavior During Therapy [facility] for tasks assessed/performed      Past Medical History  Diagnosis Date  . Allergy   . Neuromuscular disorder ([place])   . Cataract   . Anxiety   . Anxiety     Past Surgical History  Procedure Laterality Date  . Eye surgery    . Cyst removal from bladder      There were no vitals filed for this visit.  Visit Diagnosis:  Pain of right lower extremity  Weakness of both legs      Subjective Assessment - 04/19/15 0940    Subjective I am still sore in the buttock. Patient reports she is 50% better.    Pertinent History had a similar problem in L hip 1 year ago   Limitations Sitting;Walking;Standing   Patient Stated Goals decrease pain with activites   Currently in Pain? Yes   Pain Score 3    Pain Location Hip  lateral thigh   Pain Orientation Right   Pain Descriptors / Indicators Aching   Pain Type Chronic pain   Pain Onset More than a month ago   Pain Frequency Intermittent  during the night is constant   Aggravating Factors  sleeping and has to turn   Pain Relieving Factors meds, ice   Effect of Pain on Daily Activities decreased walking and activities   Multiple Pain Sites No            [facility] PT Assessment - 04/19/15 0001    Assessment   Medical Diagnosis Right thigh pain   Referring Provider Dr. Philis Fendt   Onset Date/Surgical Date 02/24/15   Precautions   Precautions None   Balance Screen   Has the patient fallen in the past 6 months No   Has the patient had a decrease in activity level because of a fear of falling?  No   Is the patient reluctant to leave their home because of a fear of falling?  No   Home Environment   Living Environment Private residence   Living Arrangements Spouse/significant other   Additional Comments split level house, pt's husband recently had a CVA and she has been taking care of him   Prior Function   Level of Independence Independent   Vocation Requirements plays cello   Cognition   Overall Cognitive Status Within Functional Limits for tasks assessed   Observation/Other Assessments   Focus on Therapeutic Outcomes (FOTO)  42% limited   Sensation   Light Touch Appears Intact   Coordination   Gross Motor Movements are Fluid and Coordinated Yes   ROM / Strength   AROM / PROM / Strength AROM;Strength   Strength   Right Hip Flexion 4-/5   Right Hip Extension  4/5   Right Hip ABduction 4-/5   Left Hip Flexion 5/5   Left Hip Extension 4+/5   Left Hip ABduction 3+/5                     [facility] Adult PT Treatment/Exercise - 04/19/15 0001    Lumbar Exercises: Stretches   Single Knee to Chest Stretch 3 reps;20 seconds  , sitting left LE over Rt LE    Knee/Hip Exercises: Sidelying   Hip ABduction Right;Left;10 reps  abdominal brace, technique   Hip ABduction Limitations therapist used tactile cues to prevent hip flexion and rolling backwards   Knee/Hip Exercises: Prone   Hip Extension Strengthening;Right;Left;10 reps  prone on elbows on counter   Iontophoresis   Type of Iontophoresis Dexamethasone  #2   Location Rt hip area   Dose 9ml   Time 6hour patch   Manual Therapy   Manual Therapy Soft tissue mobilization   Soft tissue mobilization soft tissue  work to right gluteal  and right iliotibial band                  PT Short Term Goals - 04/19/15 0958    PT SHORT TERM GOAL #1   Title Pt will be independent in intial HEP   Time 4   Period Weeks   Status Achieved   PT SHORT TERM GOAL #2   Title Pt will increase bilat hip strength to 3+/5 bilat   Time 4   Period Weeks   Status Achieved   PT SHORT TERM GOAL #3   Title Pt  will tolerate walking 20 minutes without increase in symptoms   Time 4   Period Weeks   Status On-going           PT Long Term Goals - 04/19/15 5170    PT LONG TERM GOAL #1   Title Pt will improve FOTO to <= 36% to demo improved mobility   Time 8   Period Weeks   Status On-going   PT LONG TERM GOAL #2   Title Pt will improve bilat hip strength to 4/5   Time 8   Period Weeks   Status On-going   PT LONG TERM GOAL #3   Title Pt will walk dogs x 30 minutes without increase in symptoms   Time 8   Period Weeks   Status On-going   PT LONG TERM GOAL #4   Title Pt will sit for playing cello x 1 hour without increase of symptoms   Time 8   Period Weeks   Status On-going               Plan - 04/19/15 1009    Clinical Impression Statement Patient is a 74 year old female with diagnosis of righ tthigh pain with sudden onset.  Patient has met STG#1, 2.   Patient has not  met walking goal for 20 min.  Hip strength right/left: flexion 4-/5/5, abduction 4-/3+/5, and extension 4/4-/5.  Patient has increase strength.  Patient  reports her apin s 50% better.  Patient reports constant pain with sleeping which is an ache.  Patient would benefit from physical therpay to reduce pain.    Pt will benefit from skilled therapeutic intervention in order to improve on the following deficits Decreased mobility;Pain;Decreased endurance;Impaired flexibility;Decreased strength;Decreased range of motion   Rehab Potential Good   Clinical Impairments Affecting Rehab Potential None   PT Frequency 2x / week   PT  Duration 8 weeks   PT Treatment/Interventions Manual techniques;Passive range of motion;Dry needling;Taping;Patient/family education;Neuromuscular re-education;Balance training;Therapeutic exercise;Therapeutic activities;Functional mobility training;Stair training;ADLs/Self Care Home Management;Cryotherapy;Electrical Stimulation;Iontophoresis 4mg /ml Dexamethasone;Moist Heat   PT Next Visit Plan Ionto #2 to Rt hip area posterior. Continue with stretching hip and lumbar; Work on hip abd, extension   PT Home Exercise Plan progress as needed   Consulted and Agree with Plan of Care Patient        Problem List Patient Active Problem List   Diagnosis Date Noted  . Left shoulder pain 01/12/2015    Calvin Jablonowski,PT 04/19/2015, 10:13 AM  [facility] [address] [place], [place], [address] Phone: [phone]   Fax:  [phone]  Name: Sheila Anderson MRN: [id number] Date of Birth: 1940-06-18   Physical Therapy Progress Note  Dates of Reporting Period: 04/06/2015 to 04/19/2015  Objective Reports of Subjective Statement: Patient reports pain has improved by 50%. Patient reports constant ache in right hip while sleeping.   Objective Measurements: Hip strength right/left/5: flexion 4-/5/5, abduction 4-/3+/5, and extension 4/4/5.   Goal Update: Patient has met STG # 1 and #2.  Patient has not  Met the other goals due to decreased strength and not abel to walk long periods of time.   Plan: continue with the above treatment and has  4 more ionto treatments.   Reason Skilled Services are Required: to further strengthen hips,manual skills and reduce pain.  Earlie Counts, PT 04/19/2015 10:16 AM

## 2015-04-20 ENCOUNTER — Telehealth: Payer: Self-pay | Admitting: Family Medicine

## 2015-04-20 NOTE — Telephone Encounter (Signed)
LEFT A MESSAGE FOR PATIENT TO RETURN CALL TO FIND OUT IF SHE HAS HAD A MAMMOGRAM AND IF SO WHERE AND WHEN?  IF NOT, WE NEED TO GET HER ONE SCHEDULED TO CLOSE THE GAPS IN HER CARE.

## 2015-04-21 ENCOUNTER — Telehealth: Payer: Self-pay | Admitting: Family Medicine

## 2015-04-21 NOTE — Telephone Encounter (Signed)
Patient was returning my call about her mammogram.  She stated that she has had a mammogram at [facility] so I will call and get a copy of her last mammogram.  She refused a annual exam at this time until she establishes with a new primary care physician.n  Called [facility] and she has not been there since 2013. She did state that she is due a mammogram and is going to schedule this herself.

## 2015-04-22 ENCOUNTER — Ambulatory Visit: Payer: Medicare PPO | Admitting: Physical Therapy

## 2015-04-22 ENCOUNTER — Encounter: Payer: Self-pay | Admitting: Physical Therapy

## 2015-04-22 DIAGNOSIS — R29898 Other symptoms and signs involving the musculoskeletal system: Secondary | ICD-10-CM

## 2015-04-22 DIAGNOSIS — M7541 Impingement syndrome of right shoulder: Secondary | ICD-10-CM | POA: Diagnosis not present

## 2015-04-22 DIAGNOSIS — M79604 Pain in right leg: Secondary | ICD-10-CM

## 2015-04-22 NOTE — Therapy (Signed)
[facility] [address] [place], [place], [address] Phone: [phone]   Fax:  [phone]  Physical Therapy Treatment  Patient Details  Name: Sheila Anderson MRN: [id number] Date of Birth: [birth year] Referring Provider: Dr. Philis Fendt  Encounter Date: 04/22/2015      PT End of Session - 04/22/15 1310    Visit Number 6   Number of Visits 10  medicare   Date for PT Re-Evaluation 06/01/15   Authorization Type Ortho net   Authorization - Visit Number 6   Authorization - Number of Visits 9   PT Start Time 1200   PT Stop Time 1230   PT Time Calculation (min) 30 min   Activity Tolerance Patient tolerated treatment well   Behavior During Therapy [facility] for tasks assessed/performed      Past Medical History  Diagnosis Date  . Allergy   . Neuromuscular disorder ([place])   . Cataract   . Anxiety   . Anxiety     Past Surgical History  Procedure Laterality Date  . Eye surgery    . Cyst removal from bladder      There were no vitals filed for this visit.  Visit Diagnosis:  Pain of right lower extremity  Weakness of both legs      Subjective Assessment - 04/22/15 1204    Subjective I felt fine after last visit. My hip felt better after last visit. At night I have to change my positions.    Pertinent History had a similar problem in L hip 1 year ago   Limitations Sitting;Walking;Standing   Patient Stated Goals decrease pain with activites   Currently in Pain? Yes   Pain Score 3    Pain Location Hip   Pain Orientation Right   Pain Descriptors / Indicators Aching   Pain Type Chronic pain   Pain Onset More than a month ago   Pain Frequency Intermittent  constant at night   Aggravating Factors  sleep and has to turn   Pain Relieving Factors meds, ice   Effect of Pain on Daily Activities decresaed walking and activities   Multiple Pain Sites No                         [facility] Adult PT  Treatment/Exercise - 04/22/15 0001    Lumbar Exercises: Stretches   Single Knee to Chest Stretch 3 reps;20 seconds  , sitting left LE over Rt LE    Piriformis Stretch 3 reps;20 seconds   Lumbar Exercises: Supine   Bridge 10 reps;5 seconds   Bridge Limitations hips wigle   Knee/Hip Exercises: Supine   Straight Leg Raises Right;Left;2 sets;10 reps;Strengthening   Knee/Hip Exercises: Sidelying   Hip ABduction Right;Left;10 reps  abdominal brace, technique   Hip ABduction Limitations therapist used tactile cues to prevent hip flexion and rolling backwards   Iontophoresis   Type of Iontophoresis Dexamethasone  #3   Location Rt hip area   Dose 34ml   Time 6hour patch                PT Education - 04/22/15 1310    Education provided No          PT Short Term Goals - 04/19/15 0958    PT SHORT TERM GOAL #1   Title Pt will be independent in intial HEP   Time 4   Period Weeks   Status Achieved   PT SHORT  TERM GOAL #2   Title Pt will increase bilat hip strength to 3+/5 bilat   Time 4   Period Weeks   Status Achieved   PT SHORT TERM GOAL #3   Title Pt  will tolerate walking 20 minutes without increase in symptoms   Time 4   Period Weeks   Status On-going           PT Long Term Goals - 04/19/15 0959    PT LONG TERM GOAL #1   Title Pt will improve FOTO to <= 36% to demo improved mobility   Time 8   Period Weeks   Status On-going   PT LONG TERM GOAL #2   Title Pt will improve bilat hip strength to 4/5   Time 8   Period Weeks   Status On-going   PT LONG TERM GOAL #3   Title Pt will walk dogs x 30 minutes without increase in symptoms   Time 8   Period Weeks   Status On-going   PT LONG TERM GOAL #4   Title Pt will sit for playing cello x 1 hour without increase of symptoms   Time 8   Period Weeks   Status On-going               Plan - 04/22/15 1312    Clinical Impression Statement Patient is a 74 year old female with diagnosis of right thigh  pain with sudden onset.  Patient reports pain decreased by 50%.  Patient has been working on strength of bil. hips and flexibility.  Patient has had 3 iontophoresis treatment.  Patient wil benefit from physical  therapy to increase hip strength and reduce pain.    Pt will benefit from skilled therapeutic intervention in order to improve on the following deficits Decreased mobility;Pain;Decreased endurance;Impaired flexibility;Decreased strength;Decreased range of motion   Rehab Potential Good   Clinical Impairments Affecting Rehab Potential None   PT Frequency 2x / week   PT Duration 8 weeks   PT Treatment/Interventions Manual techniques;Passive range of motion;Dry needling;Taping;Patient/family education;Neuromuscular re-education;Balance training;Therapeutic exercise;Therapeutic activities;Functional mobility training;Stair training;ADLs/Self Care Home Management;Cryotherapy;Electrical Stimulation;Iontophoresis 4mg /ml Dexamethasone;Moist Heat   PT Next Visit Plan Ionto #4 to Rt hip area posterior. Continue with stretching hip and lumbar; Work on hip abd, extension; work on soft tissue work   [place] progress as needed   Consulted and Agree with Plan of Care Patient        Problem List Patient Active Problem List   Diagnosis Date Noted  . Left shoulder pain 01/12/2015    GRAY,CHERYL,PT 04/22/2015, 1:16 PM  [facility] [address] [place], [place], [address] Phone: [phone]   Fax:  [phone]  Name: Sheila Anderson MRN: [id number] Date of Birth: 02/27/1941

## 2015-05-04 DIAGNOSIS — M7541 Impingement syndrome of right shoulder: Secondary | ICD-10-CM | POA: Diagnosis not present

## 2015-05-13 ENCOUNTER — Ambulatory Visit: Payer: Medicare PPO | Attending: Physician Assistant | Admitting: Physical Therapy

## 2015-05-13 ENCOUNTER — Encounter: Payer: Self-pay | Admitting: Physical Therapy

## 2015-05-13 DIAGNOSIS — M79604 Pain in right leg: Secondary | ICD-10-CM | POA: Diagnosis not present

## 2015-05-13 DIAGNOSIS — R29898 Other symptoms and signs involving the musculoskeletal system: Secondary | ICD-10-CM | POA: Diagnosis not present

## 2015-05-13 NOTE — Therapy (Signed)
[facility] [address] [place], [place], [address] Phone: [phone]   Fax:  [phone]  Physical Therapy Treatment  Patient Details  Name: Sheila Anderson MRN: [id number] Date of Birth: 09-11-1940 Referring Provider: Dr. Philis Anderson  Encounter Date: 05/13/2015      PT End of Session - 05/13/15 1029    Visit Number 7   Number of Visits 10  Medicare   Date for PT Re-Evaluation 06/01/15   Authorization Type Ortho net   Authorization Time Period 10/25-12/02/2015   Authorization - Visit Number 7   Authorization - Number of Visits 9   PT Start Time 1022   PT Stop Time 1100   PT Time Calculation (min) 38 min   Activity Tolerance Patient tolerated treatment well   Behavior During Therapy [facility] for tasks assessed/performed      Past Medical History  Diagnosis Date  . Allergy   . Neuromuscular disorder ([place])   . Cataract   . Anxiety   . Anxiety     Past Surgical History  Procedure Laterality Date  . Eye surgery    . Cyst removal from bladder      There were no vitals filed for this visit.  Visit Diagnosis:  Pain of right lower extremity  Weakness of both legs      Subjective Assessment - 05/13/15 1026    Subjective I walked on the beach which helped. Sitting in the car aggravated it.  Patient reports her pain is 80% better. Patient reports sleep is 80% better.    Pertinent History had a similar problem in L hip 1 year ago   Patient Stated Goals decrease pain with activites   Currently in Pain? Yes   Pain Score 2    Pain Location Hip   Pain Orientation Right   Pain Descriptors / Indicators Aching   Pain Type Chronic pain   Pain Onset More than a month ago   Pain Frequency Intermittent   Aggravating Factors  sitting   Pain Relieving Factors rest   Multiple Pain Sites No                         [facility] Adult PT Treatment/Exercise - 05/13/15 0001    Lumbar Exercises: Stretches    Piriformis Stretch 3 reps;20 seconds   Lumbar Exercises: Supine   Bridge 10 reps;5 seconds  after had some discomfort in right hip   Knee/Hip Exercises: Supine   Straight Leg Raises Right;Left;2 sets;10 reps;Strengthening   Iontophoresis   Type of Iontophoresis Dexamethasone  #4   Location Rt hip area   Dose 51m   Time 6hour patch   Manual Therapy   Manual Therapy Neural Stretch;Muscle Energy Technique   Soft tissue mobilization posterior right hip and gluteus medius   Neural Stretch right LE                  PT Short Term Goals - 05/13/15 1056    PT SHORT TERM GOAL #3   Title Pt  will tolerate walking 20 minutes without increase in symptoms   Time 4   Period Weeks   Status Achieved           PT Long Term Goals - 04/19/15 03825   PT LONG TERM GOAL #1   Title Pt will improve FOTO to <= 36% to demo improved mobility   Time 8   Period Weeks   Status  On-going   PT LONG TERM GOAL #2   Title Pt will improve bilat hip strength to 4/5   Time 8   Period Weeks   Status On-going   PT LONG TERM GOAL #3   Title Pt will walk dogs x 30 minutes without increase in symptoms   Time 8   Period Weeks   Status On-going   PT LONG TERM GOAL #4   Title Pt will sit for playing cello x 1 hour without increase of symptoms   Time 8   Period Weeks   Status On-going               Plan - 05/13/15 1054    Clinical Impression Statement Patient is a 74 year old female with diagnosis of right thigh pain with sudden onset. Patient has met all of her STG's.  Patient has 80% less pain with sitting and sleeping. Patient has increased tenderness and thickness in posterior right hip. Patient will benefit from phsyical therapy to reduce pain.    Pt will benefit from skilled therapeutic intervention in order to improve on the following deficits Decreased mobility;Pain;Decreased endurance;Impaired flexibility;Decreased strength;Decreased range of motion   Rehab Potential Good    Clinical Impairments Affecting Rehab Potential None   PT Frequency 2x / week   PT Duration 8 weeks   PT Treatment/Interventions Manual techniques;Passive range of motion;Dry needling;Taping;Patient/family education;Neuromuscular re-education;Balance training;Therapeutic exercise;Therapeutic activities;Functional mobility training;Stair training;ADLs/Self Care Home Management;Cryotherapy;Electrical Stimulation;Iontophoresis 77m/ml Dexamethasone;Moist Heat   PT Next Visit Plan Ionto #5 to Rt hip area posterior. Continue with stretching hip and lumbar; Work on hip abd, extension; work on soft tissue work   PUteprogress as needed   Consulted and Agree with Plan of Care Patient        Problem List Patient Active Problem List   Diagnosis Date Noted  . Left shoulder pain 01/12/2015    Sheila Anderson,PT 05/13/2015, 10:57 AM  [place] [facility] [address] [place] [address]Phone: [phone]  Fax:  [phone] Name: GKENYONNA MICEKMRN: [id number] of Birth: [birth year]

## 2015-05-18 ENCOUNTER — Ambulatory Visit: Payer: Medicare PPO | Admitting: Physical Therapy

## 2015-05-18 DIAGNOSIS — M79604 Pain in right leg: Secondary | ICD-10-CM

## 2015-05-18 DIAGNOSIS — R29898 Other symptoms and signs involving the musculoskeletal system: Secondary | ICD-10-CM | POA: Diagnosis not present

## 2015-05-18 NOTE — Therapy (Signed)
[facility] [address], [place], [place], [address] Phone: [phone]   Fax:  [phone]  Physical Therapy Treatment  Patient Details  Name: Sheila Anderson MRN: [id number] Date of Birth: 02/27/1941 Referring Provider: Dr. Philis Fendt  Encounter Date: 05/18/2015      PT End of Session - 05/18/15 1139    Visit Number 8   Number of Visits 10   Date for PT Re-Evaluation 06/01/15   Authorization Type Ortho net   Authorization Time Period 10/25-12/02/2015   Authorization - Visit Number 8   Authorization - Number of Visits 9   PT Start Time 1100   PT Stop Time 1139   PT Time Calculation (min) 39 min   Activity Tolerance Patient tolerated treatment well   Behavior During Therapy [facility] for tasks assessed/performed      Past Medical History  Diagnosis Date  . Allergy   . Neuromuscular disorder ([place])   . Cataract   . Anxiety   . Anxiety     Past Surgical History  Procedure Laterality Date  . Eye surgery    . Cyst removal from bladder      There were no vitals filed for this visit.  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   Pain Score 0-No pain                         [facility] Adult PT Treatment/Exercise - 05/18/15 0001    Lumbar Exercises: Stretches   Piriformis Stretch 3 reps;20 seconds  seated   Lumbar Exercises: Supine   Bridge 10 reps;5 seconds   Knee/Hip Exercises: Supine   Straight Leg Raises Both;20 reps   Knee/Hip Exercises: Sidelying   Hip ABduction Both;20 reps   Iontophoresis   Type of Iontophoresis Dexamethasone  #5   Location Rt hip   Dose 74ml   Time 6 hour patch   Manual Therapy   Manual Therapy Neural Stretch;Muscle Energy Technique   Soft tissue mobilization Rt hip and glute med, QL   Neural Stretch R LE                   PT Short Term Goals - 05/18/15 1140    PT SHORT TERM GOAL #1   Title Pt will be independent in intial HEP   Status Achieved   PT SHORT TERM GOAL #2   Title Pt will increase bilat hip strength to 3+/5 bilat   Status Achieved   PT SHORT TERM GOAL #3   Title Pt  will tolerate walking 20 minutes without increase in symptoms   Status Achieved           PT Long Term Goals - 05/18/15 1140    PT LONG TERM GOAL #1   Title Pt will improve FOTO to <= 36% to demo improved mobility   Time 8   Period Weeks   Status On-going   PT LONG TERM GOAL #2   Title Pt will improve bilat hip strength to 4/5   Time 8   Period Weeks   Status On-going   PT LONG TERM GOAL #3   Title Pt will walk dogs x 30 minutes without increase in symptoms   Time 8   Period Weeks   Status On-going  PT LONG TERM GOAL #4   Title Pt will sit for playing cello x 1 hour without increase of symptoms   Time 8   Period Weeks   Status Achieved               Plan - 05/18/15 1139    Clinical Impression Statement 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  Pt continues with R LE weakness in hip extensors and abductors.   Pt will benefit from skilled therapeutic intervention in order to improve on the following deficits Decreased mobility;Pain;Decreased endurance;Impaired flexibility;Decreased strength;Decreased range of motion   PT Frequency 2x / week   PT Duration 8 weeks   PT Treatment/Interventions Manual techniques;Passive range of motion;Dry needling;Taping;Patient/family education;Neuromuscular re-education;Balance training;Therapeutic exercise;Therapeutic activities;Functional mobility training;Stair training;ADLs/Self Care Home Management;Cryotherapy;Electrical Stimulation;Iontophoresis 4mg /ml Dexamethasone;Moist Heat   PT Next Visit Plan progress HEP for hip strengthening        Problem List Patient Active Problem List    Diagnosis Date Noted  . Left shoulder pain 01/12/2015    Isabelle Course, PT, DPT  05/18/2015, 11:42 AM  [facility] [address] [place], [place], [address] Phone: [phone]   Fax:  [phone]  Name: Sheila Anderson MRN: [id number] Date of Birth: 21-Nov-1940

## 2015-05-20 ENCOUNTER — Encounter: Payer: Self-pay | Admitting: Physical Therapy

## 2015-05-20 ENCOUNTER — Ambulatory Visit: Payer: Medicare PPO | Admitting: Physical Therapy

## 2015-05-20 DIAGNOSIS — R29898 Other symptoms and signs involving the musculoskeletal system: Secondary | ICD-10-CM | POA: Diagnosis not present

## 2015-05-20 DIAGNOSIS — M79604 Pain in right leg: Secondary | ICD-10-CM | POA: Diagnosis not present

## 2015-05-20 NOTE — Therapy (Signed)
[facility] [address], [place], [place], [address] Phone: [phone]   Fax:  [phone]  Physical Therapy Treatment  Patient Details  Name: Sheila Anderson MRN: [id number] Date of Birth: 02/27/1941 Referring Provider: Dr. Philis Fendt  Encounter Date: 05/20/2015      PT End of Session - 05/20/15 1108    Visit Number 9   Number of Visits 10   Date for PT Re-Evaluation 06/01/15   Authorization Type Ortho net   Authorization Time Period 10/25-12/02/2015   Authorization - Visit Number 9   Authorization - Number of Visits 9   PT Start Time 1100   PT Stop Time 1143   PT Time Calculation (min) 43 min   Activity Tolerance Patient tolerated treatment well   Behavior During Therapy [facility] for tasks assessed/performed      Past Medical History  Diagnosis Date  . Allergy   . Neuromuscular disorder ([place])   . Cataract   . Anxiety   . Anxiety     Past Surgical History  Procedure Laterality Date  . Eye surgery    . Cyst removal from bladder      There were no vitals filed for this visit.  Visit Diagnosis:  Pain of right lower extremity  Weakness of both legs      Subjective Assessment - 05/20/15 1133    Subjective I am better.   Pertinent History had a similar problem in L hip 1 year ago   Limitations Sitting;Walking;Standing   Patient Stated Goals decrease pain with activites   Currently in Pain? No/denies            [facility] PT Assessment - 05/20/15 0001    Observation/Other Assessments   Focus on Therapeutic Outcomes (FOTO)  31% limitation CJ   Strength   Right Hip Flexion 5/5   Right Hip Extension 5/5   Right Hip ABduction 4-/5   Left Hip Flexion 5/5   Left Hip Extension 5/5   Left Hip ABduction 4-/5                     [facility] Adult PT Treatment/Exercise - 05/20/15 0001    Lumbar Exercises: Stretches   Active Hamstring Stretch 3 reps;30 seconds   Single Knee to Chest Stretch 3  reps;30 seconds   Piriformis Stretch 3 reps;20 seconds  seated   Lumbar Exercises: Sidelying   Clam 15 reps  tactile cues to not rock hip   Hip Abduction 20 reps  modify by going 3/4 of way   Lumbar Exercises: Prone   Straight Leg Raise 10 reps  bil. withvc on keeing hip down   Lumbar Exercises: Quadruped   Straight Leg Raise 10 reps  bil. with arms on counter   Iontophoresis   Type of Iontophoresis Dexamethasone  #6   Location Rt hip   Dose 74m   Time 6 hour patch                PT Education - 05/20/15 1142    Education provided Yes   Education Details reviewed HEP and patient able to demonstrate correctly   Person(s) Educated Patient   Methods Demonstration;Explanation   Comprehension Returned demonstration;Verbalized understanding          PT Short Term Goals - 05/18/15 1140    PT SHORT TERM GOAL #1   Title Pt will be independent in intial HEP   Status Achieved   PT SHORT TERM GOAL #2  Title Pt will increase bilat hip strength to 3+/5 bilat   Status Achieved   PT SHORT TERM GOAL #3   Title Pt  will tolerate walking 20 minutes without increase in symptoms   Status Achieved           PT Long Term Goals - 2015/06/04 1109    PT LONG TERM GOAL #1   Title Pt will improve FOTO to <= 36% to demo improved mobility   Time 8   Period Weeks   Status Achieved   PT LONG TERM GOAL #2   Title Pt will improve bilat hip strength to 4/5   Time 8   Period Weeks   Status Partially Met  bil. hip abduction 4-/5   PT LONG TERM GOAL #3   Title Pt will walk dogs x 30 minutes without increase in symptoms   Time 8   Period Weeks   Status Achieved   PT LONG TERM GOAL #4   Title Pt will sit for playing cello x 1 hour without increase of symptoms   Time 8   Period Weeks   Status Achieved               Plan - 06/04/2015 1132    Clinical Impression Statement Patient has met all of her STG and LTG except for LTG# 2 due to bil. hip abduction 4-/5.  FOTO has  improved to 31% limitation.  Patient is independent with her HEP. Patient reports her pain is 80% better.    Pt will benefit from skilled therapeutic intervention in order to improve on the following deficits Decreased mobility;Pain;Decreased endurance;Impaired flexibility;Decreased strength;Decreased range of motion   Rehab Potential Good   Clinical Impairments Affecting Rehab Potential None   PT Treatment/Interventions Manual techniques;Passive range of motion;Dry needling;Taping;Patient/family education;Neuromuscular re-education;Balance training;Therapeutic exercise;Therapeutic activities;Functional mobility training;Stair training;ADLs/Self Care Home Management;Cryotherapy;Electrical Stimulation;Iontophoresis 36m/ml Dexamethasone;Moist Heat   PT Next Visit Plan Discharge to HEP   PT Home Exercise Plan Current HEP   Consulted and Agree with Plan of Care Patient          G-Codes - [id number]    Functional Assessment Tool Used FOTO score is 31% limitation CJ   Functional Limitation Mobility: Walking and moving around   Mobility: Walking and Moving Around Goal Status [phone] At least 20 percent but less than 40 percent impaired, limited or restricted   Mobility: Walking and Moving Around Discharge Status [phone] At least 20 percent but less than 40 percent impaired, limited or restricted      Problem List Patient Active Problem List   Diagnosis Date Noted  . Left shoulder pain 01/12/2015    Sheila Anderson 112/23/16 11:44 AM  [facility] [address] [place] [address]: [phone]  Fax:  [phone] Name: Sheila WICKWIREMRN: [id number] of Birth: 8December 12, 1942   PHYSICAL THERAPY DISCHARGE SUMMARY  Visits from Start of Care: 9 Current functional level related to goals / functional outcomes: See above.  Has not met strength goal due to bil. Hip abduction 4-/5.    Remaining deficits: See  above.    Education / Equipment: HEP Plan: Patient agrees to discharge.  Patient goals were met. Patient is being discharged due to meeting the stated rehab goals. Thank you for the referral. CEarlie Counts PT 112/23/1611:45 AM   ?????

## 2015-05-20 NOTE — Patient Instructions (Signed)
Abduction: Clam (Eccentric) - Side-Lying    Lie on side with knees bent. Lift top knee, keeping feet together. Keep trunk steady. Slowly lower for 3-5 seconds. _10__ reps per set, 1___ sets per day, _1__ days per week. Add __0_ lbs when you achieve ___ repetitions.  http://ecce.exer.us/65   Copyright  VHI. All rights reserved.

## 2015-05-24 DIAGNOSIS — M7541 Impingement syndrome of right shoulder: Secondary | ICD-10-CM | POA: Diagnosis not present

## 2015-05-27 DIAGNOSIS — M7541 Impingement syndrome of right shoulder: Secondary | ICD-10-CM | POA: Diagnosis not present

## 2015-06-01 DIAGNOSIS — M7541 Impingement syndrome of right shoulder: Secondary | ICD-10-CM | POA: Diagnosis not present

## 2015-06-03 DIAGNOSIS — M7541 Impingement syndrome of right shoulder: Secondary | ICD-10-CM | POA: Diagnosis not present

## 2015-10-18 ENCOUNTER — Ambulatory Visit (INDEPENDENT_AMBULATORY_CARE_PROVIDER_SITE_OTHER): Payer: Medicare Other | Admitting: Ophthalmology

## 2015-10-18 DIAGNOSIS — H35372 Puckering of macula, left eye: Secondary | ICD-10-CM | POA: Diagnosis not present

## 2015-10-18 DIAGNOSIS — H2511 Age-related nuclear cataract, right eye: Secondary | ICD-10-CM | POA: Diagnosis not present

## 2015-10-18 DIAGNOSIS — H353111 Nonexudative age-related macular degeneration, right eye, early dry stage: Secondary | ICD-10-CM

## 2015-10-18 DIAGNOSIS — H35711 Central serous chorioretinopathy, right eye: Secondary | ICD-10-CM | POA: Diagnosis not present

## 2015-10-18 DIAGNOSIS — H43811 Vitreous degeneration, right eye: Secondary | ICD-10-CM

## 2015-11-18 ENCOUNTER — Telehealth: Payer: Self-pay

## 2015-11-18 NOTE — Telephone Encounter (Signed)
The pt is scheduled to have right shoulder reverse TSA on 12/24/15 with [facility]. They are requesting pre-operative cardiac clearance to assess the surgical risks of the pt.  The pt was seen by Dr Irish Lack on 03/01/15 as was advised to f/u with [place] as needed.  Please advise.

## 2015-11-18 NOTE — Telephone Encounter (Signed)
Did she ever have a f/u stress test after she was unable to reach target on the treadmill.  Also, has she been walking and had any CP or SHOB?  I think she was supposed to f/u according to stress test result from [place].

## 2015-11-19 NOTE — Telephone Encounter (Signed)
**Note De-identified Sheila Anderson Obfuscation** LMTCB

## 2015-11-22 NOTE — Telephone Encounter (Signed)
The pt states that she did not call to schedule her stress myoview because since she had the GXT on 03/26/15 her husband has had a stroke and been diagnosed with cancer. She states that she walks more regularly now and does not have any CP or SOB.

## 2015-11-22 NOTE — Telephone Encounter (Signed)
New message   Returning the nurses call about surgery clearance.

## 2015-11-23 ENCOUNTER — Ambulatory Visit (INDEPENDENT_AMBULATORY_CARE_PROVIDER_SITE_OTHER): Payer: Medicare Other | Admitting: Family Medicine

## 2015-11-23 VITALS — BP 118/76 | HR 80 | Temp 98.7°F | Resp 18 | Ht 66.5 in | Wt 147.7 lb

## 2015-11-23 DIAGNOSIS — M545 Low back pain, unspecified: Secondary | ICD-10-CM

## 2015-11-23 DIAGNOSIS — R1900 Intra-abdominal and pelvic swelling, mass and lump, unspecified site: Secondary | ICD-10-CM | POA: Diagnosis not present

## 2015-11-23 LAB — POCT CBC
Granulocyte percent: 51.9 %G (ref 37–80)
HCT, POC: 35.9 % — AB (ref 37.7–47.9)
Hemoglobin: 12.7 g/dL (ref 12.2–16.2)
Lymph, poc: 1.4 (ref 0.6–3.4)
MCH, POC: 33 pg — AB (ref 27–31.2)
MCHC: 35.2 g/dL (ref 31.8–35.4)
MCV: 93.5 fL (ref 80–97)
MID (CBC): 0.4 (ref 0–0.9)
MPV: 6.2 fL (ref 0–99.8)
POC Granulocyte: 1.9 — AB (ref 2–6.9)
POC LYMPH PERCENT: 38.3 %L (ref 10–50)
POC MID %: 9.8 % (ref 0–12)
Platelet Count, POC: 265 10*3/uL (ref 142–424)
RBC: 3.84 M/uL — AB (ref 4.04–5.48)
RDW, POC: 13.9 %
WBC: 3.6 10*3/uL — AB (ref 4.6–10.2)

## 2015-11-23 LAB — POCT URINALYSIS DIP (MANUAL ENTRY)
Bilirubin, UA: NEGATIVE
Glucose, UA: NEGATIVE
Leukocytes, UA: NEGATIVE
Nitrite, UA: NEGATIVE
PROTEIN UA: NEGATIVE
RBC UA: NEGATIVE
Urobilinogen, UA: 0.2
pH, UA: 5

## 2015-11-23 LAB — COMPREHENSIVE METABOLIC PANEL
ALBUMIN: 4.1 g/dL (ref 3.6–5.1)
ALK PHOS: 49 U/L (ref 33–130)
ALT: 16 U/L (ref 6–29)
AST: 19 U/L (ref 10–35)
BUN: 14 mg/dL (ref 7–25)
CALCIUM: 9.1 mg/dL (ref 8.6–10.4)
CO2: 29 mmol/L (ref 20–31)
Chloride: 105 mmol/L (ref 98–110)
Creat: 0.77 mg/dL (ref 0.60–0.93)
Glucose, Bld: 102 mg/dL — ABNORMAL HIGH (ref 65–99)
Potassium: 3.9 mmol/L (ref 3.5–5.3)
Sodium: 140 mmol/L (ref 135–146)
Total Bilirubin: 0.4 mg/dL (ref 0.2–1.2)
Total Protein: 6.1 g/dL (ref 6.1–8.1)

## 2015-11-23 LAB — POC MICROSCOPIC URINALYSIS (UMFC)

## 2015-11-23 MED ORDER — MELOXICAM 15 MG PO TABS: 15.0000 mg | ORAL_TABLET | Freq: Every day | ORAL | Status: DC

## 2015-11-23 MED ORDER — METHOCARBAMOL 500 MG PO TABS: 500.0000 mg | ORAL_TABLET | Freq: Four times a day (QID) | ORAL | Status: DC

## 2015-11-23 NOTE — Patient Instructions (Addendum)
IF you received an x-ray today, you will receive an invoice from [facility]. Please contact [facility] at [phone] with questions or concerns regarding your invoice.   IF you received labwork today, you will receive an invoice from [employer]. Please contact [facility] at [phone] with questions or concerns regarding your invoice.   Our billing staff will not be able to assist you with questions regarding bills from these companies.  You will be contacted with the lab results as soon as they are available. The fastest way to get your results is to activate your My Chart account. Instructions are located on the last page of this paperwork. If you have not heard from [place] regarding the results in 2 weeks, please contact this office.    Thoracic Strain A thoracic strain, which is sometimes called a mid-back strain, is an injury to the muscles or tendons that attach to the upper part of your back behind your chest. This type of injury occurs when a muscle is overstretched or overloaded.  Thoracic strains can range from mild to severe. Mild strains may involve stretching a muscle or tendon without tearing it. These injuries may heal in 1-2 weeks. More severe strains involve tearing of muscle fibers or tendons. These will cause more pain and may take 6-8 weeks to heal. CAUSES This condition may be caused by:  An injury in which a sudden force is placed on the muscle.  Exercising without properly warming up.  Overuse of the muscle.  Improper form during certain movements.  Other injuries that surround or cause stress on the mid-back, causing a strain on the muscles. In some cases, the cause may not be known. RISK FACTORS This injury is more common in:  Athletes.  People with obesity. SYMPTOMS The main symptom of this condition is pain, especially with movement. Other symptoms  include:  Bruising.  Swelling.  Spasm. DIAGNOSIS This condition may be diagnosed with a physical exam. X-rays may be taken to check for a fracture. TREATMENT This condition may be treated with:  Resting and icing the injured area.  Physical therapy. This will involve doing stretching and strengthening exercises.  Medicines for pain and inflammation. HOME CARE INSTRUCTIONS  Rest as needed. Follow instructions from your health care provider about any restrictions on activity.  If directed, apply ice to the injured area:  Put ice in a plastic bag.  Place a towel between your skin and the bag.  Leave the ice on for 20 minutes, 2-3 times per day.  Take over-the-counter and prescription medicines only as told by your health care provider.  Begin doing exercises as told by your health care provider or physical therapist.  Always warm up properly before physical activity or sports.  Bend your knees before you lift heavy objects.  Keep all follow-up visits as told by your health care provider. This is important. SEEK MEDICAL CARE IF:  Your pain is not helped by medicine.  Your pain, bruising, or swelling is getting worse.  You have a fever. SEEK IMMEDIATE MEDICAL CARE IF:  You have shortness of breath.  You have chest pain.  You develop numbness or weakness in your legs.  You have involuntary loss of urine (urinary incontinence).   This information is not intended to replace advice given to you by your health care provider. Make sure you discuss any questions you have with your health care provider.   Document Released: 08/19/2003 Document Revised: 02/17/2015 Document Reviewed:  07/23/2014 Elsevier Interactive Patient Education [employer].

## 2015-11-23 NOTE — Progress Notes (Signed)
By signing my name below, I, [name], attest that this documentation has been prepared under the direction and in the presence of [name], MD.  Electronically Signed: [name], Medical Scribe. 11/23/2015. 4:44 PM.  Subjective:    Patient ID: Sheila Anderson, female    DOB: 1940/12/05, 75 y.o.   MRN: [id number]  HPI Chief Complaint  Patient presents with  . Back Pain    2 weeks    HPI Comments: Sheila Anderson is a 75 y.o. female who presents to the [facility] complaining of back pain onset 2 weeks. Pain started when she went to the beach in [place]. Pt isn't sure if the pain started during the drive there/back, or when she was carrying luggage up the stairs. Pt has pain in her lower back and the right side of her back. Pain is relieved with advil and lifting her body up while sitting. Pt has used an ice-pack with out relief to her pain. Pt has an over active bladder and hasn't noticed any irregularities to this. Pt's urine looks the same. Pt has bad back pain in the past du to a pinched nerve. 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 Pt's vertebrae was closing around her radial nerve. Pt was last seen here 6 months by Philis Fendt for right thigh and knee pain started on naprosyn and sent to PT. Her x-rays show osteopenia and moderate arthritis.  Pt has chronic cough and acid reflux. When pt coughs there is a knot near the location of her hernia surgery about 5 years ago. Pt mention post surgery was fine. Pt reports nodules under her rib cage.  Pt needs surgical clearance. Pt is having reverse shoulder surgery on her right shoulder July 13th. Pt is supposed to be cleared for surgery for [place] [facility] by Dr. Veverly Fells. Pt get exercise on her shoulder. Pt has been getting care at [facility] for a long time.   Patient Active Problem List   Diagnosis Date Noted  . Left shoulder pain 01/12/2015   Past Medical History  Diagnosis Date  . Allergy   . Neuromuscular disorder  ([place])   . Cataract   . Anxiety   . Anxiety    Past Surgical History  Procedure Laterality Date  . Eye surgery    . Cyst removal from bladder     Allergies  Allergen Reactions  . Nickel Rash   Prior to Admission medications   Medication Sig Start Date End Date Taking? Authorizing Provider  cetirizine (ZYRTEC) 10 MG tablet Take 10 mg by mouth daily.   Yes Historical Provider, MD  COD LIVER OIL PO Take 1 capsule by mouth daily.   Yes Historical Provider, MD  Coenzyme Q10 (CO Q 10 PO) Take 1 tablet by mouth daily.   Yes Historical Provider, MD  Glucos-MSM-C-Mn-Ginger-Willow (GLUCOSAMINE MSM COMPLEX PO) Take 1 capsule by mouth daily.   Yes Historical Provider, MD  mometasone (NASONEX) 50 MCG/ACT nasal spray Place 2 sprays into the nose daily. 01/12/15  Yes Mancel Bale, PA-C  Multiple Vitamins-Minerals (ICAPS AREDS 2 PO) Take 1 capsule by mouth daily.   Yes Historical Provider, MD  Multiple Vitamins-Minerals (ONE-A-DAY WOMENS 50 PLUS PO) Take 1 tablet by mouth daily. Reported on 11/23/2015   Yes Historical Provider, MD  ranitidine (ZANTAC) 150 MG tablet Take 150 mg by mouth as needed for heartburn.   Yes Historical Provider, MD  Soy Protein-Soy Isoflavone (SOY CARE MENOPAUSE PO) Take 1 tablet by  mouth 2 (two) times daily.   Yes Historical Provider, MD  vitamin A 10000 UNIT capsule Take 10,000 Units by mouth daily.   Yes Historical Provider, MD  VITAMIN E PO Take 1 capsule by mouth as needed.   Yes Historical Provider, MD  Ascorbic Acid (VITAMIN C PO) Take 1 tablet by mouth as needed. Reported on 11/23/2015    Historical Provider, MD  clobetasol ointment (TEMOVATE) 0.05 % Reported on 11/23/2015 01/07/15   Historical Provider, MD  Multiple Vitamins-Minerals (PRESERVISION AREDS 2 PO) Take 1 tablet by mouth daily. Reported on 11/23/2015    Historical Provider, MD  naproxen (NAPROSYN) 375 MG tablet Take 1 tablet (375 mg total) by mouth 2 (two) times daily with a meal. Patient not taking: Reported on  11/23/2015 03/15/15   Tereasa Coop, PA-C   Social History   Social History  . Marital Status: Married    Spouse Name: N/A  . Number of Children: N/A  . Years of Education: N/A   Occupational History  . Not on file.   Social History Main Topics  . Smoking status: Never Smoker   . Smokeless tobacco: Not on file  . Alcohol Use: Yes  . Drug Use: No  . Sexual Activity: Yes   Other Topics Concern  . Not on file   Social History Narrative   Depression screen [facility] 2/9 11/23/2015 03/15/2015 01/12/2015  Decreased Interest 0 0 0  Down, Depressed, Hopeless 0 0 0  PHQ - 2 Score 0 0 0   Review of Systems  Constitutional: Negative for fever and fatigue.  HENT: Negative for sore throat and trouble swallowing.   Respiratory: Positive for cough (chronic). Negative for chest tightness, shortness of breath and wheezing.   Cardiovascular: Negative for chest pain.  Gastrointestinal: Positive for abdominal pain (acid reflux). Negative for nausea, vomiting, diarrhea and constipation.  Genitourinary: Negative for hematuria and difficulty urinating.  Musculoskeletal: Positive for back pain. Negative for joint swelling and neck pain.  Neurological: Negative for weakness, numbness and headaches.  Psychiatric/Behavioral: Negative for sleep disturbance.    Objective:  BP 118/76 mmHg  Pulse 80  Temp(Src) 98.7 F (37.1 C) (Oral)  Resp 18  Ht 5' 6.5" (1.689 m)  Wt 147 lb 11.2 oz (66.996 kg)  BMI 23.48 kg/m2  SpO2 97%  Physical Exam  Neck:  No cva tenderness  Musculoskeletal:  Lower thoracic and upper lumbar is TTP over the spinus process TTP over the right lower thoracic p-spinal Hip flexors negative Neg straight leg raise bilaterally Subcutanious thickening distal to the costal margin on the right  Neurological:  Reflex Scores:      Patellar reflexes are 3+ on the right side and 3+ on the left side.      Achilles reflexes are 2+ on the right side and 2+ on the left side. Skin: No rash  noted.   Results for orders placed or performed in visit on 11/23/15  POCT urinalysis dipstick  Result Value Ref Range   Color, UA yellow yellow   Clarity, UA clear clear   Glucose, UA negative negative   Bilirubin, UA negative negative   Ketones, POC UA trace (5) (A) negative   Spec Grav, UA >=1.030    Blood, UA negative negative   pH, UA 5.0    Protein Ur, POC negative negative   Urobilinogen, UA 0.2    Nitrite, UA Negative Negative   Leukocytes, UA Negative Negative  POCT Microscopic Urinalysis ([facility])  Result Value Ref Range  WBC,UR,HPF,POC None None WBC/hpf   RBC,UR,HPF,POC None None RBC/hpf   Bacteria Few (A) None, Too numerous to count   Mucus Present (A) Absent   Epithelial Cells, UR Per Microscopy Moderate (A) None, Too numerous to count cells/hpf  POCT CBC  Result Value Ref Range   WBC 3.6 (A) 4.6 - 10.2 K/uL   Lymph, poc 1.4 0.6 - 3.4   POC LYMPH PERCENT 38.3 10 - 50 %L   MID (cbc) 0.4 0 - 0.9   POC MID % 9.8 0 - 12 %M   POC Granulocyte 1.9 (A) 2 - 6.9   Granulocyte percent 51.9 37 - 80 %G   RBC 3.84 (A) 4.04 - 5.48 M/uL   Hemoglobin 12.7 12.2 - 16.2 g/dL   HCT, POC 35.9 (A) 37.7 - 47.9 %   MCV 93.5 80 - 97 fL   MCH, POC 33.0 (A) 27 - 31.2 pg   MCHC 35.2 31.8 - 35.4 g/dL   RDW, POC 13.9 %   Platelet Count, POC 265 142 - 424 K/uL   MPV 6.2 0 - 99.8 fL    Assessment & Plan:  Starts imaging if not improved with routine care.   1. Acute bilateral low back pain without sciatica   2. Abdominal swelling, mass, or lump     Orders Placed This Encounter  Procedures  . CT Abdomen Pelvis W Contrast    153 lbs/ no needs /no diabetic / no hx of kidney cancer , kidney dz or solitary kidney / no allergy to iv contrast / uhc& medicare  / mdc / pt / epic order   labs drawn on 11/23/15 they are in epic / mdc     Standing Status: Future     Number of Occurrences:      Standing Expiration Date: 02/22/2017    Order Specific Question:  If indicated for the ordered  procedure, I authorize the administration of contrast media per Radiology protocol    Answer:  Yes    Order Specific Question:  Reason for Exam (SYMPTOM  OR DIAGNOSIS REQUIRED)    Answer:  Canaan Holzer for incisional hernia just left to umbilicus. eval for new RUQ mass    Order Specific Question:  Preferred imaging location?    Answer:  [facility]  . Comprehensive metabolic panel  . POCT urinalysis dipstick  . POCT Microscopic Urinalysis ([facility])  . POCT CBC    Meds ordered this encounter  Medications  . vitamin A 10000 UNIT capsule    Sig: Take 10,000 Units by mouth daily.  . meloxicam (MOBIC) 15 MG tablet    Sig: Take 1 tablet (15 mg total) by mouth daily.    Dispense:  30 tablet    Refill:  1  . methocarbamol (ROBAXIN) 500 MG tablet    Sig: Take 1 tablet (500 mg total) by mouth 4 (four) times daily.    Dispense:  40 tablet    Refill:  0   Over 40 min spent in face-to-face evaluation of and consultation with patient and coordination of care.  Over 50% of this time was spent counseling this patient.  I personally performed the services described in this documentation, which was scribed in my presence. The recorded information has been reviewed and considered, and addended by me as needed.   [name], M.D.  [facility] [address] [place], [place] [address] [phone] phone [phone] fax  12/10/2015 9:05 AM

## 2015-11-24 ENCOUNTER — Encounter: Payer: Self-pay | Admitting: Family Medicine

## 2015-11-26 NOTE — Telephone Encounter (Signed)
Can she tell us specifically what her walking regimen is?  Does she walk up stairs , or go out for a walk?  Determining her exercise capacity predicts how well she will do with surgery.

## 2015-11-26 NOTE — Telephone Encounter (Signed)
LMTCB on both the pts cell and home phone VM.

## 2015-11-29 NOTE — Telephone Encounter (Signed)
OK. No further cardiac testing needed before surgery.

## 2015-11-29 NOTE — Telephone Encounter (Signed)
Follow-up ° ° ° °The pt is returning the nurses phone call °

## 2015-11-29 NOTE — Telephone Encounter (Signed)
**Note De-Identified Leng Montesdeoca Obfuscation** This message has been manually faxed to Santiago Bur with [employer] at [phone]. I did receive confirmation that the fax went successfully.

## 2015-11-29 NOTE — Telephone Encounter (Addendum)
**Note De-Identified Sheila Anderson Obfuscation** The pt states that she lives in a split level house with 10 stairs going up and about 5 stairs going down and that she goes up and down those steps up to 10 times or more daily without SOB, fatigue or weakness. Also she states that she walks her dog at least 30 mins daily without SOB, fatigue or weakness. She states that this is all the exercise that she gets and she denies ever having CP.

## 2015-12-02 ENCOUNTER — Other Ambulatory Visit: Payer: Self-pay

## 2015-12-09 NOTE — H&P (Signed)
Sheila Anderson is an 75 y.o. female.    Chief Complaint: right shoulder  HPI: Pt is a 75 y.o. female complaining of right shoulder pain for multiple years. Pain had continually increased since the beginning. X-rays in the clinic show end-stage arthritic changes of the right shoulder. Pt has tried various conservative treatments which have failed to alleviate their symptoms, including injections and therapy. Various options are discussed with the patient. Risks, benefits and expectations were discussed with the patient. Patient understand the risks, benefits and expectations and wishes to proceed with surgery.   PCP:  Robyn Haber, MD  D/C Plans: Home  PMH: Past Medical History  Diagnosis Date  . Allergy   . Neuromuscular disorder ([place])   . Cataract   . Anxiety   . Anxiety     PSH: Past Surgical History  Procedure Laterality Date  . Eye surgery    . Cyst removal from bladder      Social History:  reports that she has never smoked. She does not have any smokeless tobacco history on file. She reports that she drinks alcohol. She reports that she does not use illicit drugs.  Allergies:  Allergies  Allergen Reactions  . Nickel Rash    Medications: No current facility-administered medications for this encounter.   Current Outpatient Prescriptions  Medication Sig Dispense Refill  . Ascorbic Acid (VITAMIN C PO) Take 1 tablet by mouth as needed. Reported on 11/23/2015    . cetirizine (ZYRTEC) 10 MG tablet Take 10 mg by mouth daily.    . clobetasol ointment (TEMOVATE) 0.05 % Reported on 11/23/2015  0  . COD LIVER OIL PO Take 1 capsule by mouth daily.    . Coenzyme Q10 (CO Q 10 PO) Take 1 tablet by mouth daily.    Donnie Aho (GLUCOSAMINE MSM COMPLEX PO) Take 1 capsule by mouth daily.    . meloxicam (MOBIC) 15 MG tablet Take 1 tablet (15 mg total) by mouth daily. 30 tablet 1  . methocarbamol (ROBAXIN) 500 MG tablet Take 1 tablet (500 mg total) by mouth 4  (four) times daily. 40 tablet 0  . mometasone (NASONEX) 50 MCG/ACT nasal spray Place 2 sprays into the nose daily. 17 g 12  . Multiple Vitamins-Minerals (ICAPS AREDS 2 PO) Take 1 capsule by mouth daily.    . Multiple Vitamins-Minerals (ONE-A-DAY WOMENS 50 PLUS PO) Take 1 tablet by mouth daily. Reported on 11/23/2015    . Multiple Vitamins-Minerals (PRESERVISION AREDS 2 PO) Take 1 tablet by mouth daily. Reported on 11/23/2015    . ranitidine (ZANTAC) 150 MG tablet Take 150 mg by mouth as needed for heartburn.    . Soy Protein-Soy Isoflavone (SOY CARE MENOPAUSE PO) Take 1 tablet by mouth 2 (two) times daily.    . vitamin A 10000 UNIT capsule Take 10,000 Units by mouth daily.    Marland Kitchen VITAMIN E PO Take 1 capsule by mouth as needed.      No results found for this or any previous visit (from the past 48 hour(s)). No results found.  ROS: Pain with rom of the right upper extremity  Physical Exam:  Alert and oriented 75 y.o. female in no acute distress Cranial nerves 2-12 intact Cervical spine: full rom with no tenderness, nv intact distally Chest: active breath sounds bilaterally, no wheeze rhonchi or rales Heart: regular rate and rhythm, no murmur Abd: non tender non distended with active bowel sounds Hip is stable with rom  Right shoulder with moderate limitation with rom  and weakness with ER and IR nv intact distally No rashes or signs of infection  Assessment/Plan Assessment: right rotator cuff arthropathy  Plan: Patient will undergo a right reverse total shoulder by Dr. Veverly Fells at [facility]. Risks benefits and expectations were discussed with the patient. Patient understand risks, benefits and expectations and wishes to proceed.

## 2015-12-11 ENCOUNTER — Ambulatory Visit (INDEPENDENT_AMBULATORY_CARE_PROVIDER_SITE_OTHER): Payer: Medicare Other | Admitting: Physician Assistant

## 2015-12-11 ENCOUNTER — Telehealth: Payer: Self-pay | Admitting: *Deleted

## 2015-12-11 VITALS — BP 120/64 | HR 74 | Temp 99.3°F | Resp 16 | Ht 66.0 in | Wt 145.8 lb

## 2015-12-11 DIAGNOSIS — R21 Rash and other nonspecific skin eruption: Secondary | ICD-10-CM | POA: Diagnosis not present

## 2015-12-11 LAB — POCT WET + KOH PREP
TRICH BY WET PREP: ABSENT
Yeast by KOH: ABSENT
Yeast by wet prep: ABSENT

## 2015-12-11 LAB — POCT SKIN KOH
Skin KOH, POC: NEGATIVE
Skin KOH, POC: NEGATIVE
Skin KOH, POC: NEGATIVE

## 2015-12-11 MED ORDER — FLUCONAZOLE 150 MG PO TABS: 150.0000 mg | ORAL_TABLET | Freq: Once | ORAL | Status: DC

## 2015-12-11 NOTE — Progress Notes (Signed)
12/11/2015 5:03 PM   DOB: 07-May-1941 / MRN: [id number]  SUBJECTIVE:  Sheila Anderson is a 75 y.o. female presenting for rash.  Reports that she has had this for years and it has been difficult to control.  She has tried antibiotics, fungal creams, steroid ointments and nothing eliminates the rash.  She has never tried fluconazole.    She is allergic to robaxin and nickel.   She  has a past medical history of Allergy; Neuromuscular disorder ([place]); Cataract; Anxiety; and Anxiety.    She  reports that she has never smoked. She does not have any smokeless tobacco history on file. She reports that she drinks alcohol. She reports that she does not use illicit drugs. She  reports that she currently engages in sexual activity. The patient  has past surgical history that includes Eye surgery and cyst removal from bladder.  Her family history includes Congestive Heart Failure in her father; Heart disease in her mother; Heart failure in her father; Hypertension in her mother; Multiple sclerosis in her sister; Stroke in her mother.  Review of Systems  Constitutional: Negative for fever and chills.  Gastrointestinal: Negative for nausea.  Skin: Positive for itching and rash.  Neurological: Negative for dizziness.    Problem list and medications reviewed and updated by myself where necessary, and exist elsewhere in the encounter.   OBJECTIVE:  BP 120/64 mmHg  Pulse 74  Temp(Src) 99.3 F (37.4 C) (Oral)  Resp 16  Ht '5\' 6"'$  (1.676 m)  Wt 145 lb 12.8 oz (66.134 kg)  BMI 23.54 kg/m2  SpO2 96%  Physical Exam  Constitutional: She is oriented to person, place, and time. Vital signs are normal. She appears well-nourished. No distress.  Eyes: EOM are normal. Pupils are equal, round, and reactive to light.  Cardiovascular: Normal rate.   Pulmonary/Chest: Effort normal.  Abdominal: She exhibits no distension.  Neurological: She is alert and oriented to person, place, and time. No cranial nerve  deficit. Gait normal.  Skin: Skin is dry. Rash noted. She is not diaphoretic. There is erythema.  Psychiatric: She has a normal mood and affect.  Vitals reviewed.   Results for orders placed or performed in visit on 12/11/15 (from the past 72 hour(s))  POCT Skin KOH     Status: Normal   Collection Time: 12/11/15  3:18 PM  Result Value Ref Range   Skin KOH, POC Negative     Comment: Buttock  POCT Skin KOH     Status: Normal   Collection Time: 12/11/15  3:19 PM  Result Value Ref Range   Skin KOH, POC Negative     Comment: Neck  POCT Skin KOH     Status: Normal   Collection Time: 12/11/15  3:19 PM  Result Value Ref Range   Skin KOH, POC Negative     Comment: Left Axillary  POCT Wet + KOH Prep     Status: Abnormal   Collection Time: 12/11/15  4:07 PM  Result Value Ref Range   Yeast by KOH Absent Present, Absent   Yeast by wet prep Absent Present, Absent   WBC by wet prep None None, Few, Too numerous to count   Clue Cells Wet Prep HPF POC None None, Too numerous to count   Trich by wet prep Absent Present, Absent   Bacteria Wet Prep HPF POC Few None, Few, Too numerous to count   Epithelial Cells By [employer] Pref ([facility]) Moderate (A) None, Few, Too numerous to count  RBC,UR,HPF,POC None None RBC/hpf      Hepatic Function Latest Ref Rng 11/23/2015 01/25/2010 01/06/2010  Total Protein 6.1 - 8.1 g/dL 6.1 6.1 6.4  Albumin 3.6 - 5.1 g/dL 4.1 3.7 3.9  AST 10 - 35 U/L '19 20 20  '$ ALT 6 - 29 U/L '16 15 16  '$ Alk Phosphatase 33 - 130 U/L 49 50 48  Total Bilirubin 0.2 - 1.2 mg/dL 0.4 0.6 1.0        No results found.  ASSESSMENT AND PLAN  Sheila Anderson was seen today for rash.  Diagnoses and all orders for this visit:  Rash and nonspecific skin eruption: Her KOH has come back negative, however I have a hard time trusting thee result given the appearance of the rash as well as the geographical location.  Thre rash is painless and does not light up on woods lamp.  It is itchy and scaly.  I will try  treating her for fungus with diflucan to see if this makes an impact.  If not will further pursue steroid creams but will hold that for now in an effort to try and come up with a single etiology.  -     POCT Skin KOH -     POCT Skin KOH -     POCT Skin KOH -     POCT Wet + KOH Prep -     fluconazole (DIFLUCAN) 150 MG tablet; Take 1 tablet (150 mg total) by mouth once. Repeat if needed    The patient was advised to call or return to clinic if she does not see an improvement in symptoms or to seek the care of the closest emergency department if she worsens with the above plan.   Philis Fendt, MHS, PA-C [facility] 12/11/2015 5:03 PM

## 2015-12-11 NOTE — Patient Instructions (Signed)
     IF you received an x-ray today, you will receive an invoice from [facility]. Please contact [facility] at [phone] with questions or concerns regarding your invoice.   IF you received labwork today, you will receive an invoice from [facility]. Please contact [facility] at [phone] with questions or concerns regarding your invoice.   Our billing staff will not be able to assist you with questions regarding bills from these companies.  You will be contacted with the lab results as soon as they are available. The fastest way to get your results is to activate your My Chart account. Instructions are located on the last page of this paperwork. If you have not heard from us regarding the results in 2 weeks, please contact this office.      

## 2015-12-13 ENCOUNTER — Other Ambulatory Visit: Payer: Self-pay | Admitting: Physician Assistant

## 2015-12-13 ENCOUNTER — Other Ambulatory Visit: Payer: Self-pay | Admitting: Emergency Medicine

## 2015-12-13 MED ORDER — FLUOCINOLONE ACETONIDE 0.01 % EX CREA: TOPICAL_CREAM | Freq: Two times a day (BID) | CUTANEOUS | Status: DC

## 2015-12-13 MED ORDER — CLOTRIMAZOLE-BETAMETHASONE 1-0.05 % EX CREA: 1.0000 "application " | TOPICAL_CREAM | Freq: Two times a day (BID) | CUTANEOUS | Status: DC

## 2015-12-13 NOTE — Telephone Encounter (Signed)
Pt wants Sheila Anderson to be aware of the fluconazole (DIFLUCAN) 150 MG tablet [id number] not working for her. She wants him to call in the cream that they discussed before please, As patient thinks this will help much better. Please call patient when medication has been sent in. [phone]

## 2015-12-13 NOTE — Telephone Encounter (Signed)
Please relay

## 2015-12-13 NOTE — Telephone Encounter (Signed)
Please let her know I have sent in a cream.  Philis Fendt, MS, PA-C 1:19 PM, 12/13/2015

## 2015-12-13 NOTE — Telephone Encounter (Signed)
FLUOCOCINETIDE IS WHAT SHE WANTED.  NOT THE CREAM MICHAEL CALLED IN,.     [place]

## 2015-12-16 ENCOUNTER — Other Ambulatory Visit (HOSPITAL_COMMUNITY): Payer: Self-pay | Admitting: *Deleted

## 2015-12-16 NOTE — Pre-Procedure Instructions (Signed)
    Anneke Shakespeare Rosselli  12/16/2015      [id number] - [place], [place] [address] AT [address] [place] [address] Phone: [phone] Fax: [phone]    Your procedure is scheduled on 12-24-2015    Friday   Report to [facility] Admitting at 12 noon   Call this number if you have problems the morning of surgery:  [phone]     Remember:  Do not eat food or drink liquids after midnight.   Take these medicines the morning of surgery with A SIP OF WATER cetirizine(Zyrtec)'methocarbonal(Robaxin),Nasonex nasal spray,ranitidine(Zantac) if needed             STOP ASPIRIN,ANTIINFLAMATORIES (IBUPROFEN,ALEVE,MOTRIN,ADVIL,GOODY'S POWDERS),HERBAL SUPPLEMENTS,FISH OIL,AND VITAMINS 5-7 DAYS PRIOR TO SURGERY         Do not wear jewelry, make-up or nail polish.  Do not wear lotions, powders, or perfumes.  You may NOT wear deoderant  Do not shave 48 hours prior to surgery.   .  Do not bring valuables to the hospital.  [facility] is not responsible for any belongings or valuables.  Contacts, dentures or bridgework may not be worn into surgery.  Leave your suitcase in the car.  After surgery it may be brought to your room.  For patients admitted to the hospital, discharge time will be determined by your treatment team.  Patients discharged the day of surgery will not be allowed to drive home.    Special instructions:  See attached Sheet for instructions on CHG showers  Please read over the following fact sheets that you were given. Coughing and Deep Breathing, MRSA Information and Surgical Site Infection Prevention,Incentive Spirometry

## 2015-12-17 ENCOUNTER — Encounter (HOSPITAL_COMMUNITY)
Admission: RE | Admit: 2015-12-17 | Discharge: 2015-12-17 | Disposition: A | Discharge status: Home / Self Care | Payer: Medicare Other | Source: Ambulatory Visit | Attending: Orthopedic Surgery | Admitting: Orthopedic Surgery

## 2015-12-17 ENCOUNTER — Encounter (HOSPITAL_COMMUNITY): Payer: Self-pay

## 2015-12-17 DIAGNOSIS — Z01812 Encounter for preprocedural laboratory examination: Secondary | ICD-10-CM | POA: Insufficient documentation

## 2015-12-17 DIAGNOSIS — M12811 Other specific arthropathies, not elsewhere classified, right shoulder: Secondary | ICD-10-CM | POA: Insufficient documentation

## 2015-12-17 HISTORY — DX: Gastro-esophageal reflux disease without esophagitis: K21.9

## 2015-12-17 HISTORY — DX: Unspecified osteoarthritis, unspecified site: M19.90

## 2015-12-17 HISTORY — DX: Personal history of other diseases of the digestive system: Z87.19

## 2015-12-17 LAB — BASIC METABOLIC PANEL
Anion gap: 6 (ref 5–15)
BUN: 11 mg/dL (ref 6–20)
CHLORIDE: 105 mmol/L (ref 101–111)
CO2: 29 mmol/L (ref 22–32)
Calcium: 9.8 mg/dL (ref 8.9–10.3)
Creatinine, Ser: 0.74 mg/dL (ref 0.44–1.00)
GFR calc Af Amer: >60 mL/min (ref >60)
GFR calc non Af Amer: >60 mL/min (ref >60)
GLUCOSE: 87 mg/dL (ref 65–99)
POTASSIUM: 4.3 mmol/L (ref 3.5–5.1)
Sodium: 140 mmol/L (ref 135–145)

## 2015-12-17 LAB — SURGICAL PCR SCREEN
MRSA, PCR: NEGATIVE
Staphylococcus aureus: NEGATIVE

## 2015-12-17 LAB — CBC
HEMATOCRIT: 40.6 % (ref 36.0–46.0)
Hemoglobin: 13.2 g/dL (ref 12.0–15.0)
MCH: 31.5 pg (ref 26.0–34.0)
MCHC: 32.5 g/dL (ref 30.0–36.0)
MCV: 96.9 fL (ref 78.0–100.0)
Platelets: 250 10*3/uL (ref 150–400)
RBC: 4.19 MIL/uL (ref 3.87–5.11)
RDW: 12.9 % (ref 11.5–15.5)
WBC: 4.6 10*3/uL (ref 4.0–10.5)

## 2015-12-24 ENCOUNTER — Encounter (HOSPITAL_COMMUNITY): Admission: RE | Payer: Self-pay | Source: Ambulatory Visit

## 2015-12-24 ENCOUNTER — Inpatient Hospital Stay (HOSPITAL_COMMUNITY): Admission: RE | Admit: 2015-12-24 | Payer: Medicare Other | Source: Ambulatory Visit | Admitting: Orthopedic Surgery

## 2015-12-24 SURGERY — ARTHROPLASTY, SHOULDER, TOTAL, REVERSE
Anesthesia: Regional | Site: Shoulder | Laterality: Right

## 2016-01-14 ENCOUNTER — Ambulatory Visit (INDEPENDENT_AMBULATORY_CARE_PROVIDER_SITE_OTHER): Payer: Medicare Other | Admitting: Physician Assistant

## 2016-01-14 VITALS — BP 120/74 | HR 97 | Temp 98.7°F | Resp 17 | Ht 66.5 in | Wt 140.0 lb

## 2016-01-14 DIAGNOSIS — M958 Other specified acquired deformities of musculoskeletal system: Secondary | ICD-10-CM

## 2016-01-14 DIAGNOSIS — K769 Liver disease, unspecified: Secondary | ICD-10-CM

## 2016-01-14 DIAGNOSIS — R21 Rash and other nonspecific skin eruption: Secondary | ICD-10-CM | POA: Diagnosis not present

## 2016-01-14 DIAGNOSIS — IMO0002 Reserved for concepts with insufficient information to code with codable children: Secondary | ICD-10-CM

## 2016-01-14 DIAGNOSIS — N898 Other specified noninflammatory disorders of vagina: Secondary | ICD-10-CM

## 2016-01-14 DIAGNOSIS — K7689 Other specified diseases of liver: Secondary | ICD-10-CM | POA: Diagnosis not present

## 2016-01-14 LAB — POCT WET + KOH PREP
Trich by wet prep: ABSENT
YEAST BY KOH: ABSENT
YEAST BY WET PREP: ABSENT

## 2016-01-14 MED ORDER — FLUOCINONIDE-E 0.05 % EX CREA: 1.0000 "application " | TOPICAL_CREAM | Freq: Two times a day (BID) | CUTANEOUS | 0 refills | Status: DC

## 2016-01-14 MED ORDER — FLUOCINOLONE ACETONIDE 0.01 % EX CREA: TOPICAL_CREAM | Freq: Two times a day (BID) | CUTANEOUS | 0 refills | Status: DC

## 2016-01-14 NOTE — Progress Notes (Signed)
01/15/2016 8:17 AM   DOB: 12-31-1940 / MRN: Sheila Anderson Anderson  SUBJECTIVE:  Sheila Anderson Anderson is a 75 y.o. female presenting for recheck of rash which has largely resolved.  The last time I saw her she had similar rash in three places, her armpits and gluteal cleft.  She was given fluconazole weekly and and vanos which resolved the rash.  She is out of the cream now and feels the the rash of the gluteal cleft is returning.    She reports to me that she received an MRI of her back and her orthopod found some liver lesions.  He recommended that she come here for further eval.  She denies any RUQ pain.  No previous images to date.  She complains of a lump on her abdomen that increases when she coughs.  Denies pain.  Defecating normally.   She is allergic to mobic [meloxicam]; robaxin [methocarbamol]; and nickel.   She  has a past medical history of Allergy; Anemia; Anxiety; Anxiety; Arthritis; Cataract; GERD (gastroesophageal reflux disease); History of hiatal hernia; and Neuromuscular disorder ([place]).    She  reports that she has never smoked. She has never used smokeless tobacco. She reports that she drinks about 1.8 oz of alcohol per week . She reports that she does not use drugs. She  reports that she currently engages in sexual activity. The patient  has a past surgical history that includes cyst removal from bladder; Eye surgery (Left); wrinkle in retina (Left); and laproscopy.  Her family history includes Congestive Heart Failure in her father; Heart disease in her mother; Heart failure in her father; Hypertension in her mother; Multiple sclerosis in her sister; Stroke in her mother.  Review of Systems  Gastrointestinal: Negative for abdominal pain, blood in stool, constipation and nausea.  Genitourinary: Negative for dysuria.  Neurological: Negative for dizziness and headaches.  Psychiatric/Behavioral: Negative for depression.    The problem list and medications were reviewed and updated by  myself where necessary and exist elsewhere in the encounter.   OBJECTIVE:  BP 120/74 (BP Location: Left Arm, Patient Position: Sitting, Cuff Size: Normal)   Pulse 97   Temp 98.7 F (37.1 C) (Oral)   Resp 17   Ht 5' 6.5" (1.689 m)   Wt 140 lb (63.5 kg)   SpO2 95%   BMI 22.26 kg/m   Physical Exam  Cardiovascular: Normal rate and regular rhythm.   Pulmonary/Chest: Effort normal and breath sounds normal.  Abdominal:    Genitourinary:       Lab Results  Component Value Date   ALT 16 11/23/2015   AST 19 11/23/2015   ALKPHOS 49 11/23/2015   BILITOT 0.4 11/23/2015     Results for orders placed or performed in visit on 01/14/16 (from the past 72 hour(s))  POCT Wet + KOH Prep     Status: None   Collection Time: 01/14/16  6:12 PM  Result Value Ref Range   Yeast by KOH Absent Present, Absent   Yeast by wet prep Absent Present, Absent   WBC by wet prep Few None, Few, Too numerous to count   Clue Cells Wet Prep HPF POC None None, Too numerous to count   Trich by wet prep Absent Present, Absent   Bacteria Wet Prep HPF POC Few None, Few, Too numerous to count   Epithelial Cells By [employer] ([facility]) Few None, Few, Too numerous to count   RBC,UR,HPF,POC None None RBC/hpf    No results found.  ASSESSMENT  AND PLAN  Sheila Anderson Anderson was seen today for follow-up.  Diagnoses and all orders for this visit:  Vaginal irritation -     POCT Wet + KOH Prep  Abdominal wall defect: Will measure with an [place].  Doubt any surgical intervention is needed given abdominal content only bulge with valsalva.  -     US Abdomen Limited; Future  Liver lesion: No MRI in [facility] for review.  Will start with a RUQ [place].   -     US Abdomen Limited RUQ; Future  Rash and nonspecific skin eruption -     fluocinonide-emollient (LIDEX-E) 0.05 % cream; Apply 1 application topically 2 (two) times daily.  Other orders -     Discontinue: fluocinolone (VANOS) 0.01 % cream; Apply topically 2 (two) times daily.    The  patient is advised to call or return to clinic if she does not see an improvement in symptoms, or to seek the care of the closest emergency department if she worsens with the above plan.   Philis Fendt, MHS, PA-C [facility] 01/15/2016 8:17 AM

## 2016-01-14 NOTE — Patient Instructions (Signed)
     IF you received an x-ray today, you will receive an invoice from [facility]. Please contact [facility] at [phone] with questions or concerns regarding your invoice.   IF you received labwork today, you will receive an invoice from [facility]. Please contact [facility] at [phone] with questions or concerns regarding your invoice.   Our billing staff will not be able to assist you with questions regarding bills from these companies.  You will be contacted with the lab results as soon as they are available. The fastest way to get your results is to activate your My Chart account. Instructions are located on the last page of this paperwork. If you have not heard from us regarding the results in 2 weeks, please contact this office.      

## 2016-01-18 ENCOUNTER — Other Ambulatory Visit: Payer: Self-pay | Admitting: Physician Assistant

## 2016-01-27 ENCOUNTER — Ambulatory Visit
Admission: RE | Admit: 2016-01-27 | Discharge: 2016-01-27 | Disposition: A | Discharge status: Home / Self Care | Payer: Medicare Other | Source: Ambulatory Visit | Attending: Physician Assistant | Admitting: Physician Assistant

## 2016-01-27 ENCOUNTER — Other Ambulatory Visit: Payer: Self-pay | Admitting: Physician Assistant

## 2016-01-27 DIAGNOSIS — IMO0002 Reserved for concepts with insufficient information to code with codable children: Secondary | ICD-10-CM

## 2016-02-07 ENCOUNTER — Encounter: Payer: Self-pay | Admitting: Neurology

## 2016-02-07 ENCOUNTER — Ambulatory Visit (INDEPENDENT_AMBULATORY_CARE_PROVIDER_SITE_OTHER): Payer: Medicare Other | Admitting: Neurology

## 2016-02-07 VITALS — BP 103/68 | HR 78 | Ht 66.5 in | Wt 139.4 lb

## 2016-02-07 DIAGNOSIS — M5417 Radiculopathy, lumbosacral region: Secondary | ICD-10-CM

## 2016-02-07 DIAGNOSIS — M5416 Radiculopathy, lumbar region: Secondary | ICD-10-CM

## 2016-02-07 NOTE — Progress Notes (Signed)
[id number]    Provider:  Dr Jaynee Eagles Referring Provider: Shawnee Knapp, MD Primary Care Physician:  [name], MD  CC:  Back pain.   HPI:  Sheila Anderson is a 75 y.o. female here as a referral for back pain. Started over [employer] weekend. They went to the beach and carried luggage up steps to the beach house. She had imaging which showed L1 compression fracture and multi-level degenerative changes. She has been in physical therapy which is helping. Initially the pain was in the right lower back but now it is bilateral in the paraspinal regions of the low back and her left hip is sore and has some sharp radiation of pain into the back of the legs. She can't stand for very long as this makes it worse. The pain is mostly paraspinal in the lower back on both sides tight spasms. Also some tenderness on the top of the hip bones on the back. Pain is moderate. She feels continuous weakness in the left leg. It hurts in the low back when she gets into bed or getting into her husbands truck. Hurts more with standing or sitting for too long. 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 She bought a back brace. She has epidural shots coming up. She thought she was seeing a neurosurgeon today for a second opinion.  Reviewed notes, labs and imaging from outside physicians, which showed:   Reviewed notes from [facility]. She has chronic back pain. Thoracic and lumbar. Symptoms started in July 2017. Include pain, aching and leg pain, left buttocks and posterior thigh, no weakness or numbness. Current treatments include activity modification and NSAIDs. Aleve. Current pain level VIII out of 10. She continues to have significant low back pain, increased with forward flexion, negative Patrick's test, negative straight leg raise test, no motor deficits in the lower extremities, she does have episodic anterior groin pain, she also feels that she thinks is a hernia on  the left lower quadrant. No loss or changes in bowel and bladder control area. MRI thoracic and lumbar spines showed degenerative disc disease at L5-S1 which can be the source of her low back pain. Also seen were multilevel mild degenerative changes in the thoracic spine, but no significant stenosis and neural compression. No surgical intervention. They have recommended physical therapy and an epidural injection for back pain.  Reviewed images on CD of the lumbar spine: Showed a recent fracture at the L1 vertebral body with up to 50% height loss and 4 mm retropulsion of fragments. No associated neural displacement or encroachment. No underlying osseous neoplasm. Multilevel degenerative changes most prominent at T12-L1 and L2-L3. At T12-L1 a moderate sized right posterior lateral right foraminal cranial disc extrusion which displaces the right L1 root and encroaches on the right at T12 dorsal root ganglion. At L2-L3, mild central canal stenosis with possible encroachment on the L3 roots. At L5-S1, advanced degenerative disc disease. No neural displacement or encroachment.  Cbc and bmp both normal with normal creatinine 0.74. 12/17/2015.  Review of Systems: Patient complains of symptoms per HPI as well as the following symptoms: No CP, no SOB. Pertinent negatives per HPI. All others negative.   Social History   Social History  . Marital status: Married    Spouse name: N/A  . Number of children: 2  . Years of education: Masters   Occupational History  . [employer]   . Music Teacher    Social History  Main Topics  . Smoking status: Never Smoker  . Smokeless tobacco: Never Used  . Alcohol use 1.8 oz/week    3 Shots of liquor per week  . Drug use: No  . Sexual activity: Yes   Other Topics Concern  . Not on file   Social History Narrative   Lives at home with husband, [name].   Right-handed.   1-2 cups caffeine per day.    Family History  Problem Relation Age of Onset  . Heart disease  Mother   . Hypertension Mother   . Stroke Mother   . Congestive Heart Failure Father   . Heart failure Father   . Multiple sclerosis Sister     Past Medical History:  Diagnosis Date  . Allergy   . Anemia   . Anxiety   . Anxiety   . Arthritis   . Back pain   . Cataract   . Closed L1 vertebral fracture ([place])   . GERD (gastroesophageal reflux disease)   . History of hiatal hernia   . Neck pain   . Neuromuscular disorder [facility])     Past Surgical History:  Procedure Laterality Date  . cyst removal from bladder    . EYE SURGERY Left   . laproscopy     poly in colon  . wrinkle in retina Left     Current Outpatient Prescriptions  Medication Sig Dispense Refill  . Ascorbic Acid (VITAMIN C PO) Take 1 tablet by mouth as needed. Reported on 11/23/2015    . cetirizine (ZYRTEC) 10 MG tablet Take 10 mg by mouth daily.    . COD LIVER OIL PO Take 1 capsule by mouth daily.    . Coenzyme Q10 (CO Q 10 PO) Take 1 tablet by mouth daily.    . fluconazole (DIFLUCAN) 150 MG tablet Take 1 tablet (150 mg total) by mouth once. Repeat if needed 4 tablet 0  . fluocinonide-emollient (LIDEX-E) 0.05 % cream Apply 1 application topically 2 (two) times daily. 60 g 0  . Glucos-MSM-C-Mn-Ginger-Willow (GLUCOSAMINE MSM COMPLEX PO) Take 1 capsule by mouth daily.    . mometasone (NASONEX) 50 MCG/ACT nasal spray Place 2 sprays into the nose daily. 17 g 12  . Multiple Vitamins-Minerals (ICAPS AREDS 2 PO) Take 1 capsule by mouth daily. Reported on 12/17/2015    . Multiple Vitamins-Minerals (ONE-A-DAY WOMENS 50 PLUS PO) Take 1 tablet by mouth daily. Reported on 11/23/2015    . Multiple Vitamins-Minerals (PRESERVISION AREDS 2 PO) Take 1 tablet by mouth daily. Reported on 11/23/2015    . Soy Protein-Soy Isoflavone (SOY CARE MENOPAUSE PO) Take 1 tablet by mouth 2 (two) times daily.    . vitamin A 10000 UNIT capsule Take 10,000 Units by mouth daily. Reported on 12/11/2015    . VITAMIN E PO Take 1 capsule by mouth as needed.      No current facility-administered medications for this visit.     Allergies as of 02/07/2016 - Review Complete 02/07/2016  Allergen Reaction Noted  . Mobic [meloxicam] Other (See Comments) 12/17/2015  . Robaxin [methocarbamol] Other (See Comments) 12/11/2015  . Nickel Rash 01/12/2015    Vitals: BP 103/68   Pulse 78   Ht 5' 6.5" (1.689 m)   Wt 139 lb 6.4 oz (63.2 kg)   BMI 22.16 kg/m  Last Weight:  Wt Readings from Last 1 Encounters:  02/07/16 139 lb 6.4 oz (63.2 kg)   Last Height:   Ht Readings from Last 1 Encounters:  02/07/16 5'  6.5" (1.689 m)   Physical exam: Exam: Gen: NAD, conversant, well nourised, well groomed                     CV: RRR, no MRG. No Carotid Bruits. No peripheral edema, warm, nontender Eyes: Conjunctivae clear without exudates or hemorrhage  Neuro: Detailed Neurologic Exam  Speech:    Speech is normal; fluent and spontaneous with normal comprehension.  Cognition:    The patient is oriented to person, place, and time;     recent and remote memory intact;     language fluent;     normal attention, concentration,     fund of knowledge Cranial Nerves:    The pupils are equal, round, and reactive to light. The fundi are normal and spontaneous venous pulsations are present. Visual fields are full to finger confrontation. Extraocular movements are intact. Trigeminal sensation is intact and the muscles of mastication are normal. The face is symmetric. The palate elevates in the midline. Hearing intact. Voice is normal. Shoulder shrug is normal. The tongue has normal motion without fasciculations.   Coordination:    Normal finger to nose and heel to shin. Normal rapid alternating movements.   Gait:    Normal native gait  Motor Observation:    No asymmetry, no atrophy, and no involuntary movements noted. Tone:    Normal muscle tone.    Posture:    Posture is normal. normal erect    Strength:  Right arm with giveway due to pain, Left hip  flexion 3/5, right hip flexion 4/5 giveway due to pain.     Otherwise strength is V/V in the upper and lower limbs.      Sensation: intact to LT     Reflex Exam:  DTR's:    Deep tendon reflexes in the upper and lower extremities are normal bilaterally.   Toes:    The toes are downgoing bilaterally.   Clonus:    Clonus is absent.     Assessment/Plan:  Patient with lumbosacral radiculopathy. She thought she was here to see a neurosurgeon. She would like a referral to [facility]. Will refer for her lumbosacral radiculopathy for second opinion per patient request. Recommend following up with primary care for hip pain    Sarina Ill, MD  [facility] [address] [place], [place] [address]  Phone [phone] Fax [phone]

## 2016-02-07 NOTE — Patient Instructions (Signed)
I would like to see you back as needed, sooner if we need to. Please call us with any interim questions, concerns, problems, updates or refill requests.   Our phone number is [phone]. We also have an after hours call service for urgent matters and there is a physician on-call for urgent questions. For any emergencies you know to call 911 or go to the nearest emergency room  Referral to [place].

## 2016-02-09 ENCOUNTER — Ambulatory Visit: Payer: Self-pay | Admitting: Neurology

## 2016-02-09 DIAGNOSIS — M5417 Radiculopathy, lumbosacral region: Secondary | ICD-10-CM | POA: Insufficient documentation

## 2016-03-01 ENCOUNTER — Encounter (HOSPITAL_COMMUNITY): Payer: Self-pay

## 2016-03-01 ENCOUNTER — Encounter (HOSPITAL_COMMUNITY)
Admission: RE | Admit: 2016-03-01 | Discharge: 2016-03-01 | Disposition: A | Discharge status: Home / Self Care | Payer: Medicare Other | Source: Ambulatory Visit | Attending: Orthopedic Surgery | Admitting: Orthopedic Surgery

## 2016-03-01 ENCOUNTER — Other Ambulatory Visit: Payer: Self-pay

## 2016-03-01 DIAGNOSIS — Z01812 Encounter for preprocedural laboratory examination: Secondary | ICD-10-CM | POA: Insufficient documentation

## 2016-03-01 DIAGNOSIS — Z01810 Encounter for preprocedural cardiovascular examination: Secondary | ICD-10-CM | POA: Insufficient documentation

## 2016-03-01 DIAGNOSIS — M12811 Other specific arthropathies, not elsewhere classified, right shoulder: Secondary | ICD-10-CM | POA: Insufficient documentation

## 2016-03-01 HISTORY — DX: Other specified disorders of thyroid: E07.89

## 2016-03-01 LAB — CBC
HCT: 37.5 % (ref 36.0–46.0)
Hemoglobin: 12.1 g/dL (ref 12.0–15.0)
MCH: 31.3 pg (ref 26.0–34.0)
MCHC: 32.3 g/dL (ref 30.0–36.0)
MCV: 97.2 fL (ref 78.0–100.0)
PLATELETS: 210 10*3/uL (ref 150–400)
RBC: 3.86 MIL/uL — AB (ref 3.87–5.11)
RDW: 13.4 % (ref 11.5–15.5)
WBC: 3.3 10*3/uL — ABNORMAL LOW (ref 4.0–10.5)

## 2016-03-01 LAB — BASIC METABOLIC PANEL
Anion gap: 7 (ref 5–15)
BUN: 9 mg/dL (ref 6–20)
CO2: 25 mmol/L (ref 22–32)
Calcium: 9.6 mg/dL (ref 8.9–10.3)
Chloride: 108 mmol/L (ref 101–111)
Creatinine, Ser: 0.7 mg/dL (ref 0.44–1.00)
GFR calc Af Amer: >60 mL/min (ref >60)
GFR calc non Af Amer: >60 mL/min (ref >60)
Glucose, Bld: 80 mg/dL (ref 65–99)
Potassium: 4 mmol/L (ref 3.5–5.1)
SODIUM: 140 mmol/L (ref 135–145)

## 2016-03-01 LAB — SURGICAL PCR SCREEN
MRSA, PCR: NEGATIVE
Staphylococcus aureus: NEGATIVE

## 2016-03-01 NOTE — Pre-Procedure Instructions (Signed)
    Sheila Anderson  03/01/2016      [facility] [phone] - [address] [place] [address] Phone: [phone] Fax: [phone]    Your procedure is scheduled on Sept. 29  Report to [place] at  8:30A.M.  Call this number if you have problems the morning of surgery:  [phone]   Remember:  Do not eat food or drink liquids after midnight.  Take these medicines the morning of surgery with A SIP OF WATER : zyrtec             Stop 1 week prior to surgery: advil, motrin, ibuprofen, BC,Goody's,naproxen, vitamins and herbal medicines.   Do not wear jewelry, make-up or nail polish.  Do not wear lotions, powders, or perfumes, or deoderant.  Do not shave 48 hours prior to surgery.  Men may shave face and neck.  Do not bring valuables to the hospital.  [facility] is not responsible for any belongings or valuables.  Contacts, dentures or bridgework may not be worn into surgery.  Leave your suitcase in the car.  After surgery it may be brought to your room.  For patients admitted to the hospital, discharge time will be determined by your treatment team.  Patients discharged the day of surgery will not be allowed to drive home.   Name and phone number of your driver:   Special instructions:  Review preparing for surgery  Please read over the following fact sheets that you were given. Coughing and Deep Breathing

## 2016-03-01 NOTE — Progress Notes (Addendum)
PCP:Dr. Hendricks Limes @[facility] Cardiologist: Dr. Irish Lack  Clearance note in epic Dr. Irish Lack.  Pt. Reports she has episodes of pain in upper abdomen, stating it's from hiatal hernia. Happens when she has eatten certain foods, fast food and eating in a hurry.. Last episode in June she felt she was going to pass out. She ate a tums and it slowly resolve. Pt. States she has no chest  pain or shortness of breath when walking or going up/down steps. Sheila Anderson informed.

## 2016-03-02 NOTE — H&P (Signed)
Sheila Anderson is an 75 y.o. female.    Chief Complaint: right shoulder pain  HPI: Pt is a 75 y.o. female complaining of right shoulder pain for multiple years. Pain had continually increased since the beginning. X-rays in the clinic show end-stage arthritic changes of the right shoulder. Pt has tried various conservative treatments which have failed to alleviate their symptoms, including injections and therapy. Various options are discussed with the patient. Risks, benefits and expectations were discussed with the patient. Patient understand the risks, benefits and expectations and wishes to proceed with surgery.   PCP:  [name], MD  D/C Plans: Home  PMH: Past Medical History:  Diagnosis Date  . Allergy   . Anxiety   . Anxiety   . Arthritis   . Back pain   . Cataract   . Closed L1 vertebral fracture ([place])   . GERD (gastroesophageal reflux disease)   . History of hiatal hernia   . Neck pain   . Neuromuscular disorder ([place])   . Thyroid lesion     PSH: Past Surgical History:  Procedure Laterality Date  . CATARACT EXTRACTION Left    10 yrs ago  . cyst removal from bladder    . EYE SURGERY Left   . laproscopy     poly in colon  . wrinkle in retina Left     Social History:  reports that she has never smoked. She has never used smokeless tobacco. She reports that she drinks about 1.8 oz of alcohol per week . She reports that she does not use drugs.  Allergies:  Allergies  Allergen Reactions  . Mobic [Meloxicam] Other (See Comments)    Makes her constipated   . Robaxin [Methocarbamol] Other (See Comments)    Per patient shut down GI tract, but not sure  . Nickel Rash    Medications: No current facility-administered medications for this encounter.    Current Outpatient Prescriptions  Medication Sig Dispense Refill  . Ascorbic Acid (VITAMIN C PO) Take 1 tablet by mouth as needed. Reported on 11/23/2015    . cetirizine (ZYRTEC) 10 MG tablet Take 10 mg by mouth  daily.    . COD LIVER OIL PO Take 1 capsule by mouth daily.    . Coenzyme Q10 (CO Q 10 PO) Take 1 tablet by mouth daily.    . fluconazole (DIFLUCAN) 150 MG tablet Take 1 tablet (150 mg total) by mouth once. Repeat if needed (Patient not taking: Reported on 02/29/2016) 4 tablet 0  . fluocinonide-emollient (LIDEX-E) 0.05 % cream Apply 1 application topically 2 (two) times daily. 60 g 0  . Glucos-MSM-C-Mn-Ginger-Willow (GLUCOSAMINE MSM COMPLEX PO) Take 1 capsule by mouth daily.    . mometasone (NASONEX) 50 MCG/ACT nasal spray Place 2 sprays into the nose daily. (Patient not taking: Reported on 02/29/2016) 17 g 12  . Multiple Vitamins-Minerals (ICAPS AREDS 2 PO) Take 1 capsule by mouth daily. Reported on 12/17/2015    . Multiple Vitamins-Minerals (ONE-A-DAY WOMENS 50 PLUS PO) Take 1 tablet by mouth daily. Reported on 11/23/2015    . Soy Protein-Soy Isoflavone (SOY CARE MENOPAUSE PO) Take 1 tablet by mouth 2 (two) times daily.    Marland Kitchen VITAMIN E PO Take 1 capsule by mouth as needed.      Results for orders placed or performed during the hospital encounter of 03/01/16 (from the past 48 hour(s))  Surgical pcr screen     Status: None   Collection Time: 03/01/16 10:53 AM  Result Value Ref  Range   MRSA, PCR NEGATIVE NEGATIVE   Staphylococcus aureus NEGATIVE NEGATIVE    Comment:        The Xpert SA Assay (FDA approved for NASAL specimens in patients over 66 years of age), is one component of a comprehensive surveillance program.  Test performance has been validated by [facility] for patients greater than or equal to 45 year old. It is not intended to diagnose infection nor to guide or monitor treatment.   CBC     Status: Abnormal   Collection Time: 03/01/16 11:03 AM  Result Value Ref Range   WBC 3.3 (L) 4.0 - 10.5 K/uL   RBC 3.86 (L) 3.87 - 5.11 MIL/uL   Hemoglobin 12.1 12.0 - 15.0 g/dL   HCT 37.5 36.0 - 46.0 %   MCV 97.2 78.0 - 100.0 fL   MCH 31.3 26.0 - 34.0 pg   MCHC 32.3 30.0 - 36.0 g/dL    RDW 13.4 11.5 - 15.5 %   Platelets 210 150 - 400 K/uL  Basic metabolic panel     Status: None   Collection Time: 03/01/16 11:03 AM  Result Value Ref Range   Sodium 140 135 - 145 mmol/L   Potassium 4.0 3.5 - 5.1 mmol/L   Chloride 108 101 - 111 mmol/L   CO2 25 22 - 32 mmol/L   Glucose, Bld 80 65 - 99 mg/dL   BUN 9 6 - 20 mg/dL   Creatinine, Ser 0.70 0.44 - 1.00 mg/dL   Calcium 9.6 8.9 - 10.3 mg/dL   GFR calc non Af Amer >60 >60 mL/min   GFR calc Af Amer >60 >60 mL/min    Comment: (NOTE) The eGFR has been calculated using the CKD EPI equation. This calculation has not been validated in all clinical situations. eGFR's persistently <60 mL/min signify possible Chronic Kidney Disease.    Anion gap 7 5 - 15   No results found.  ROS: Pain with rom of the right upper extremity  Physical Exam:  Alert and oriented 75 y.o. female in no acute distress Cranial nerves 2-12 intact Cervical spine: full rom with no tenderness, nv intact distally Chest: active breath sounds bilaterally, no wheeze rhonchi or rales Heart: regular rate and rhythm, no murmur Abd: non tender non distended with active bowel sounds Hip is stable with rom  Right shoulder with limited rom and strengthdue to arthropathy nv intact distally No rashes   Assessment/Plan Assessment: right shoulder rotator cuff arthropathy  Plan: Patient will undergo a right reverse total shoulder by Dr. Veverly Fells at [facility]. Risks benefits and expectations were discussed with the patient. Patient understand risks, benefits and expectations and wishes to proceed.

## 2016-03-02 NOTE — Progress Notes (Signed)
Anesthesia Chart Review: Patient is a 75 year old female scheduled for right reverse total shoulder arthroplasty on 03/10/16 by Dr. Veverly Fells.  History includes non-smoker, anxiety, hiatal hernia, L1 vertebral fracture, back pain, neck pain, thyroid lesion, neuromuscular disorder (not specified), laparoscopic assisted right hemicolectomy for large right polyp (tubular adenoma without high grade dysplasia or malignancy) 02/04/10.  - PCP is Dr. [name]. - Cardiologist is Dr. Irish Lack. He initially saw her on 03/01/15 for evaluation of lightheadedness after eating fas and again after getting her hair dried at the beauty shop. She did not have chest pain. Thought to have had a vagal type episode. Exercise stress test and echo ordered. Echo showed normal LVEF. ETT was indeterminate due to poor exercise tolerance. A nuclear stress test was ordered, but she never did it. When Dr. Irish Lack was contacted regarding surgical clearance he inquired about how she was doing, she reported that "she lives in a split level house with 10 stairs going up and about 5 stairs going down and that she goes up and down those steps up to 10 times or more daily without SOB, fatigue or weakness. Also she states that she walks her dog at least 30 mins daily without SOB, fatigue or weakness. She states that this is all the exercise that she gets and she denies ever having CP." Based on this information, he felt she was "OK. No further cardiac testing needed before surgery." (See 11/18/15 telephone encounter.)  - Neurologist is Dr. Jaynee Eagles.  Meds include Zyrtec, cod liver oil, vitamin E.   BP (!) 115/58   Pulse 80   Temp 36.8 C   Resp 20   Ht 5\' 8"  (1.727 m)   Wt 137 lb (62.1 kg)   SpO2 99%   BMI 20.83 kg/m   03/01/16 EKG: SR, PACs.   03/26/15 Echo: Study Conclusions - Left ventricle: The cavity size was normal. Wall thickness was   normal. Systolic function was normal. The estimated ejection   fraction was in the range of 60%  to 65%. Doppler parameters are   consistent with abnormal left ventricular relaxation (grade 1   diastolic dysfunction).  03/26/15 ETT: Poor exercise capacity. No chest pain. She complained of dizziness and requested the treadmill be stopped. Normal BP response to exercise. Non-specific ST changes.  Unable to rule out ischemia at submaximal exercise. Given poor exercise tolerance and dizziness, will arrange Lexiscan myoview. FU with Dr. Casandra Doffing as planned.  Preoperative labs noted.   If no new changes then I would anticipate that she can proceed as planned.  [facility] Short Stay Center/Anesthesiology Phone [phone] 03/02/2016 4:55 PM

## 2016-03-10 ENCOUNTER — Encounter (HOSPITAL_COMMUNITY): Payer: Self-pay

## 2016-03-10 ENCOUNTER — Inpatient Hospital Stay (HOSPITAL_COMMUNITY): Payer: Medicare Other | Admitting: Vascular Surgery

## 2016-03-10 ENCOUNTER — Inpatient Hospital Stay (HOSPITAL_COMMUNITY): Payer: Medicare Other

## 2016-03-10 ENCOUNTER — Encounter (HOSPITAL_COMMUNITY)
Admission: RE | Disposition: A | Discharge status: Home / Self Care | Payer: Self-pay | Source: Ambulatory Visit | Attending: Orthopedic Surgery

## 2016-03-10 ENCOUNTER — Inpatient Hospital Stay (HOSPITAL_COMMUNITY)
Admission: RE | Admit: 2016-03-10 | Discharge: 2016-03-11 | DRG: 483 | Disposition: A | Discharge status: Home / Self Care | Payer: Medicare Other | Source: Ambulatory Visit | Attending: Orthopedic Surgery | Admitting: Orthopedic Surgery

## 2016-03-10 ENCOUNTER — Inpatient Hospital Stay (HOSPITAL_COMMUNITY): Payer: Medicare Other | Admitting: Certified Registered Nurse Anesthetist

## 2016-03-10 DIAGNOSIS — M75101 Unspecified rotator cuff tear or rupture of right shoulder, not specified as traumatic: Secondary | ICD-10-CM | POA: Diagnosis not present

## 2016-03-10 DIAGNOSIS — M19011 Primary osteoarthritis, right shoulder: Secondary | ICD-10-CM | POA: Diagnosis not present

## 2016-03-10 DIAGNOSIS — M25511 Pain in right shoulder: Secondary | ICD-10-CM | POA: Diagnosis present

## 2016-03-10 DIAGNOSIS — Z96619 Presence of unspecified artificial shoulder joint: Secondary | ICD-10-CM

## 2016-03-10 DIAGNOSIS — Z96611 Presence of right artificial shoulder joint: Secondary | ICD-10-CM

## 2016-03-10 HISTORY — PX: REVERSE SHOULDER ARTHROPLASTY: SHX5054

## 2016-03-10 SURGERY — ARTHROPLASTY, SHOULDER, TOTAL, REVERSE
Anesthesia: General | Site: Shoulder | Laterality: Right

## 2016-03-10 MED ORDER — SODIUM CHLORIDE 0.9 % IV SOLN
INTRAVENOUS | Status: DC
  Administered 2016-03-10: 16:00:00 via INTRAVENOUS

## 2016-03-10 MED ORDER — METOCLOPRAMIDE HCL 5 MG PO TABS: 5.0000 mg | ORAL_TABLET | Freq: Three times a day (TID) | ORAL | Status: DC | PRN

## 2016-03-10 MED ORDER — ACETAMINOPHEN 325 MG PO TABS: 650.0000 mg | ORAL_TABLET | Freq: Four times a day (QID) | ORAL | Status: DC | PRN

## 2016-03-10 MED ORDER — BUPIVACAINE-EPINEPHRINE (PF) 0.25% -1:200000 IJ SOLN
INTRAMUSCULAR | Status: AC
  Filled 2016-03-10: qty 30

## 2016-03-10 MED ORDER — HYDROCODONE-ACETAMINOPHEN 5-325 MG PO TABS: 1.0000 | ORAL_TABLET | ORAL | 0 refills | Status: DC | PRN

## 2016-03-10 MED ORDER — DIPHENHYDRAMINE HCL 50 MG/ML IJ SOLN
INTRAMUSCULAR | Status: DC | PRN
  Administered 2016-03-10: 12.5 mg via INTRAVENOUS

## 2016-03-10 MED ORDER — DEXAMETHASONE SODIUM PHOSPHATE 10 MG/ML IJ SOLN
INTRAMUSCULAR | Status: DC | PRN
  Administered 2016-03-10: 10 mg via INTRAVENOUS

## 2016-03-10 MED ORDER — OCUVITE-LUTEIN PO TABS
1.0000 | ORAL_TABLET | Freq: Every day | ORAL | Status: DC
  Filled 2016-03-10: qty 1

## 2016-03-10 MED ORDER — PROPOFOL 10 MG/ML IV BOLUS
INTRAVENOUS | Status: DC | PRN
  Administered 2016-03-10: 50 mg via INTRAVENOUS
  Administered 2016-03-10: 150 mg via INTRAVENOUS

## 2016-03-10 MED ORDER — FENTANYL CITRATE (PF) 100 MCG/2ML IJ SOLN
INTRAMUSCULAR | Status: AC
  Filled 2016-03-10: qty 2

## 2016-03-10 MED ORDER — PROMETHAZINE HCL 25 MG/ML IJ SOLN: 6.2500 mg | INTRAMUSCULAR | Status: DC | PRN

## 2016-03-10 MED ORDER — METOCLOPRAMIDE HCL 5 MG/ML IJ SOLN: 5.0000 mg | Freq: Three times a day (TID) | INTRAMUSCULAR | Status: DC | PRN

## 2016-03-10 MED ORDER — CEFAZOLIN SODIUM-DEXTROSE 2-4 GM/100ML-% IV SOLN
2.0000 g | Freq: Four times a day (QID) | INTRAVENOUS | Status: AC
  Administered 2016-03-10 – 2016-03-11 (×3): 2 g via INTRAVENOUS
  Filled 2016-03-10 (×3): qty 100

## 2016-03-10 MED ORDER — HYDROMORPHONE HCL 1 MG/ML IJ SOLN: 0.2500 mg | INTRAMUSCULAR | Status: DC | PRN

## 2016-03-10 MED ORDER — GLUCOSAMINE MSM COMPLEX PO TABS: ORAL_TABLET | Freq: Every day | ORAL | Status: DC

## 2016-03-10 MED ORDER — ROCURONIUM BROMIDE 10 MG/ML (PF) SYRINGE
PREFILLED_SYRINGE | INTRAVENOUS | Status: AC
  Filled 2016-03-10: qty 10

## 2016-03-10 MED ORDER — SUCCINYLCHOLINE 20MG/ML (10ML) SYRINGE FOR MEDFUSION PUMP - OPTIME
INTRAMUSCULAR | Status: DC | PRN
  Administered 2016-03-10: 100 mg via INTRAVENOUS

## 2016-03-10 MED ORDER — POLYETHYLENE GLYCOL 3350 17 G PO PACK: 17.0000 g | PACK | Freq: Every day | ORAL | Status: DC | PRN

## 2016-03-10 MED ORDER — PHENYLEPHRINE HCL 10 MG/ML IJ SOLN
INTRAVENOUS | Status: DC | PRN
  Administered 2016-03-10: 20 ug/min via INTRAVENOUS

## 2016-03-10 MED ORDER — FENTANYL CITRATE (PF) 100 MCG/2ML IJ SOLN
INTRAMUSCULAR | Status: DC | PRN
  Administered 2016-03-10 (×2): 50 ug via INTRAVENOUS
  Administered 2016-03-10: 25 ug
  Administered 2016-03-10: 25 ug via INTRAVENOUS

## 2016-03-10 MED ORDER — LIDOCAINE HCL (CARDIAC) 20 MG/ML IV SOLN
INTRAVENOUS | Status: DC | PRN
  Administered 2016-03-10: 70 mg via INTRAVENOUS

## 2016-03-10 MED ORDER — FLUTICASONE PROPIONATE 50 MCG/ACT NA SUSP
2.0000 | Freq: Every day | NASAL | Status: DC
  Filled 2016-03-10: qty 16

## 2016-03-10 MED ORDER — FLUOCINONIDE-E 0.05 % EX CREA
1.0000 "application " | TOPICAL_CREAM | Freq: Two times a day (BID) | CUTANEOUS | Status: DC
  Administered 2016-03-10 – 2016-03-11 (×2): 1 via TOPICAL
  Filled 2016-03-10: qty 15

## 2016-03-10 MED ORDER — LACTATED RINGERS IV SOLN
INTRAVENOUS | Status: DC | PRN
  Administered 2016-03-10 (×2): via INTRAVENOUS

## 2016-03-10 MED ORDER — ONDANSETRON HCL 4 MG PO TABS: 4.0000 mg | ORAL_TABLET | Freq: Four times a day (QID) | ORAL | Status: DC | PRN

## 2016-03-10 MED ORDER — CEFAZOLIN SODIUM-DEXTROSE 2-4 GM/100ML-% IV SOLN
2.0000 g | INTRAVENOUS | Status: AC
  Administered 2016-03-10: 2 g via INTRAVENOUS
  Filled 2016-03-10: qty 100

## 2016-03-10 MED ORDER — BUPIVACAINE-EPINEPHRINE (PF) 0.5% -1:200000 IJ SOLN
INTRAMUSCULAR | Status: DC | PRN
  Administered 2016-03-10: 25 mL via PERINEURAL

## 2016-03-10 MED ORDER — MIDAZOLAM HCL 2 MG/2ML IJ SOLN
INTRAMUSCULAR | Status: AC
  Filled 2016-03-10: qty 2

## 2016-03-10 MED ORDER — ACETAMINOPHEN 650 MG RE SUPP: 650.0000 mg | Freq: Four times a day (QID) | RECTAL | Status: DC | PRN

## 2016-03-10 MED ORDER — VITAMIN C 500 MG PO TABS
1000.0000 mg | ORAL_TABLET | Freq: Every day | ORAL | Status: DC
  Administered 2016-03-11: 1000 mg via ORAL
  Filled 2016-03-10: qty 2

## 2016-03-10 MED ORDER — MIDAZOLAM HCL 5 MG/5ML IJ SOLN
INTRAMUSCULAR | Status: DC | PRN
  Administered 2016-03-10: 1 mg via INTRAVENOUS
  Administered 2016-03-10: 0.5 mg
  Administered 2016-03-10: 0.5 mg via INTRAVENOUS
  Administered 2016-03-10: 2 mg via INTRAVENOUS

## 2016-03-10 MED ORDER — ONDANSETRON HCL 4 MG/2ML IJ SOLN: 4.0000 mg | Freq: Four times a day (QID) | INTRAMUSCULAR | Status: DC | PRN

## 2016-03-10 MED ORDER — CO Q 10 10 MG PO CAPS: ORAL_CAPSULE | Freq: Every day | ORAL | Status: DC

## 2016-03-10 MED ORDER — PHENOL 1.4 % MT LIQD
1.0000 | OROMUCOSAL | Status: DC | PRN
  Administered 2016-03-10: 1 via OROMUCOSAL
  Filled 2016-03-10: qty 177

## 2016-03-10 MED ORDER — MORPHINE SULFATE (PF) 2 MG/ML IV SOLN: 2.0000 mg | INTRAVENOUS | Status: DC | PRN

## 2016-03-10 MED ORDER — HYDROCODONE-ACETAMINOPHEN 5-325 MG PO TABS
1.0000 | ORAL_TABLET | ORAL | Status: DC | PRN
  Administered 2016-03-10 – 2016-03-11 (×3): 2 via ORAL
  Filled 2016-03-10 (×3): qty 2

## 2016-03-10 MED ORDER — 0.9 % SODIUM CHLORIDE (POUR BTL) OPTIME
TOPICAL | Status: DC | PRN
  Administered 2016-03-10: 1000 mL

## 2016-03-10 MED ORDER — COD LIVER OIL PO CAPS: ORAL_CAPSULE | Freq: Every day | ORAL | Status: DC

## 2016-03-10 MED ORDER — GLYCOPYRROLATE 0.2 MG/ML IJ SOLN
INTRAMUSCULAR | Status: DC | PRN
  Administered 2016-03-10: 0.2 mg via INTRAVENOUS

## 2016-03-10 MED ORDER — CHLORHEXIDINE GLUCONATE 4 % EX LIQD: 60.0000 mL | Freq: Once | CUTANEOUS | Status: DC

## 2016-03-10 MED ORDER — VITAMIN E 180 MG (400 UNIT) PO CAPS
400.0000 [IU] | ORAL_CAPSULE | Freq: Every day | ORAL | Status: DC
  Administered 2016-03-11: 400 [IU] via ORAL
  Filled 2016-03-10: qty 1

## 2016-03-10 MED ORDER — BUPIVACAINE-EPINEPHRINE 0.25% -1:200000 IJ SOLN
INTRAMUSCULAR | Status: DC | PRN
  Administered 2016-03-10: 8 mL

## 2016-03-10 MED ORDER — LORATADINE 10 MG PO TABS
10.0000 mg | ORAL_TABLET | Freq: Every day | ORAL | Status: DC
  Administered 2016-03-11: 10 mg via ORAL
  Filled 2016-03-10: qty 1

## 2016-03-10 MED ORDER — PROPOFOL 10 MG/ML IV BOLUS
INTRAVENOUS | Status: AC
  Filled 2016-03-10: qty 20

## 2016-03-10 MED ORDER — MENTHOL 3 MG MT LOZG: 1.0000 | LOZENGE | OROMUCOSAL | Status: DC | PRN

## 2016-03-10 MED ORDER — FLUCONAZOLE 150 MG PO TABS: 150.0000 mg | ORAL_TABLET | Freq: Once | ORAL | Status: DC

## 2016-03-10 SURGICAL SUPPLY — 67 items
BIT DRILL 170X2.5X (BIT) ×1 IMPLANT
BIT DRILL 5/64X5 DISP (BIT) ×3 IMPLANT
BIT DRL 170X2.5X (BIT) ×1
BLADE SAG 18X100X1.27 (BLADE) ×3 IMPLANT
BOWL SMART MIX CTS (DISPOSABLE) ×3 IMPLANT
CAPT SHLDR REVTOTAL 1 ×3 IMPLANT
CEMENT BONE DEPUY (Cement) ×3 IMPLANT
CLOSURE WOUND 1/2 X4 (GAUZE/BANDAGES/DRESSINGS) ×1
COVER SURGICAL LIGHT HANDLE (MISCELLANEOUS) ×3 IMPLANT
DRAPE IMP U-DRAPE 54X76 (DRAPES) IMPLANT
DRAPE INCISE IOBAN 66X45 STRL (DRAPES) ×3 IMPLANT
DRAPE ORTHO SPLIT 77X108 STRL (DRAPES) ×4
DRAPE SURG ORHT 6 SPLT 77X108 (DRAPES) ×2 IMPLANT
DRAPE U-SHAPE 47X51 STRL (DRAPES) ×3 IMPLANT
DRAPE X-RAY CASS 24X20 (DRAPES) IMPLANT
DRILL 2.5 (BIT) ×3
DRSG ADAPTIC 3X8 NADH LF (GAUZE/BANDAGES/DRESSINGS) ×3 IMPLANT
DRSG PAD ABDOMINAL 8X10 ST (GAUZE/BANDAGES/DRESSINGS) ×3 IMPLANT
DURAPREP 26ML APPLICATOR (WOUND CARE) ×6 IMPLANT
ELECT BLADE 4.0 EZ CLEAN MEGAD (MISCELLANEOUS) ×3
ELECT NEEDLE TIP 2.8 STRL (NEEDLE) ×3 IMPLANT
ELECT REM PT RETURN 9FT ADLT (ELECTROSURGICAL) ×3
ELECTRODE BLDE 4.0 EZ CLN MEGD (MISCELLANEOUS) ×1 IMPLANT
ELECTRODE REM PT RTRN 9FT ADLT (ELECTROSURGICAL) ×1 IMPLANT
GAUZE SPONGE 4X4 12PLY STRL (GAUZE/BANDAGES/DRESSINGS) ×3 IMPLANT
GLOVE BIOGEL PI ORTHO PRO 7.5 (GLOVE) ×2
GLOVE BIOGEL PI ORTHO PRO SZ8 (GLOVE) ×2
GLOVE ORTHO TXT STRL SZ7.5 (GLOVE) ×3 IMPLANT
GLOVE PI ORTHO PRO STRL 7.5 (GLOVE) ×1 IMPLANT
GLOVE PI ORTHO PRO STRL SZ8 (GLOVE) ×1 IMPLANT
GLOVE SURG ORTHO 8.5 STRL (GLOVE) ×3 IMPLANT
GOWN STRL REUS W/ TWL LRG LVL3 (GOWN DISPOSABLE) ×1 IMPLANT
GOWN STRL REUS W/ TWL XL LVL3 (GOWN DISPOSABLE) ×2 IMPLANT
GOWN STRL REUS W/TWL LRG LVL3 (GOWN DISPOSABLE) ×2
GOWN STRL REUS W/TWL XL LVL3 (GOWN DISPOSABLE) ×6
HANDPIECE INTERPULSE COAX TIP (DISPOSABLE)
KIT BASIN OR (CUSTOM PROCEDURE TRAY) ×3 IMPLANT
KIT ROOM TURNOVER OR (KITS) ×3 IMPLANT
MANIFOLD NEPTUNE II (INSTRUMENTS) ×3 IMPLANT
NEEDLE 1/2 CIR MAYO (NEEDLE) ×3 IMPLANT
NEEDLE HYPO 25GX1X1/2 BEV (NEEDLE) ×3 IMPLANT
NS IRRIG 1000ML POUR BTL (IV SOLUTION) ×3 IMPLANT
PACK SHOULDER (CUSTOM PROCEDURE TRAY) ×3 IMPLANT
PAD ARMBOARD 7.5X6 YLW CONV (MISCELLANEOUS) ×6 IMPLANT
PIN METAGLENE 2.5 (PIN) ×6 IMPLANT
SET HNDPC FAN SPRY TIP SCT (DISPOSABLE) IMPLANT
SLING ARM LRG ADULT FOAM STRAP (SOFTGOODS) ×3 IMPLANT
SLING ARM MED ADULT FOAM STRAP (SOFTGOODS) IMPLANT
SPONGE LAP 18X18 X RAY DECT (DISPOSABLE) IMPLANT
SPONGE LAP 4X18 X RAY DECT (DISPOSABLE) ×3 IMPLANT
STRIP CLOSURE SKIN 1/2X4 (GAUZE/BANDAGES/DRESSINGS) ×2 IMPLANT
SUCTION FRAZIER HANDLE 10FR (MISCELLANEOUS) ×2
SUCTION TUBE FRAZIER 10FR DISP (MISCELLANEOUS) ×1 IMPLANT
SUT FIBERWIRE #2 38 T-5 BLUE (SUTURE) ×12
SUT MNCRL AB 4-0 PS2 18 (SUTURE) ×3 IMPLANT
SUT VIC AB 0 CT2 27 (SUTURE) ×3 IMPLANT
SUT VIC AB 2-0 CT1 27 (SUTURE) ×2
SUT VIC AB 2-0 CT1 TAPERPNT 27 (SUTURE) ×1 IMPLANT
SUT VICRYL 0 CT 1 36IN (SUTURE) ×3 IMPLANT
SUTURE FIBERWR #2 38 T-5 BLUE (SUTURE) ×4 IMPLANT
SYR CONTROL 10ML LL (SYRINGE) ×3 IMPLANT
TOWEL OR 17X24 6PK STRL BLUE (TOWEL DISPOSABLE) ×3 IMPLANT
TOWEL OR 17X26 10 PK STRL BLUE (TOWEL DISPOSABLE) ×3 IMPLANT
TOWER CARTRIDGE SMART MIX (DISPOSABLE) IMPLANT
TRAY FOLEY CATH 16FRSI W/METER (SET/KITS/TRAYS/PACK) IMPLANT
WATER STERILE IRR 1000ML POUR (IV SOLUTION) ×3 IMPLANT
YANKAUER SUCT BULB TIP NO VENT (SUCTIONS) ×3 IMPLANT

## 2016-03-10 NOTE — Progress Notes (Signed)
Pt arrived to floor alert and orientated, wanting to eat and sit up in the chair in which we have, resting comfortable with vistors

## 2016-03-10 NOTE — Anesthesia Preprocedure Evaluation (Signed)
Anesthesia Evaluation  Patient identified by MRN, date of birth, ID band Patient awake    History of Anesthesia Complications Negative for: history of anesthetic complications  Airway Mallampati: II  TM Distance: >3 FB Neck ROM: Full    Dental  (+) Teeth Intact   Pulmonary neg pulmonary ROS,    breath sounds clear to auscultation       Cardiovascular negative cardio ROS   Rhythm:Regular Rate:Normal     Neuro/Psych  Neuromuscular disease    GI/Hepatic Neg liver ROS, hiatal hernia, GERD  ,  Endo/Other  negative endocrine ROS  Renal/GU negative Renal ROS     Musculoskeletal  (+) Arthritis ,   Abdominal   Peds  Hematology negative hematology ROS (+)   Anesthesia Other Findings   Reproductive/Obstetrics                             Anesthesia Physical Anesthesia Plan  ASA: II  Anesthesia Plan: General   Post-op Pain Management: GA combined w/ Regional for post-op pain   Induction: Intravenous  Airway Management Planned: Oral ETT  Additional Equipment:   Intra-op Plan:   Post-operative Plan: Extubation in OR  Informed Consent: I have reviewed the patients History and Physical, chart, labs and discussed the procedure including the risks, benefits and alternatives for the proposed anesthesia with the patient or authorized representative who has indicated his/her understanding and acceptance.   Dental advisory given  Plan Discussed with: CRNA  Anesthesia Plan Comments:         Anesthesia Quick Evaluation

## 2016-03-10 NOTE — Brief Op Note (Signed)
03/10/2016  1:02 PM  PATIENT:  Sheila Anderson  75 y.o. female  PRE-OPERATIVE DIAGNOSIS:  right shoulder rotator cuff tear arthropathy  POST-OPERATIVE DIAGNOSIS:  right shoulder rotator cuff tear arthropathy  PROCEDURE:  Procedure(s): RIGHT REVERSE SHOULDER ARTHROPLASTY (Right) DePuy Delta Xtend with subscap repair  SURGEON:  Surgeon(s) and Role:    * Netta Cedars, MD - Primary  PHYSICIAN ASSISTANT:   ASSISTANTS: Ventura Bruns, PA-C   ANESTHESIA:   regional and general  EBL:  Total I/O In: 1000 [I.V.:1000] Out: -   BLOOD ADMINISTERED:none  DRAINS: none   LOCAL MEDICATIONS USED:  MARCAINE     SPECIMEN:  No Specimen  DISPOSITION OF SPECIMEN:  N/A  COUNTS:  YES  TOURNIQUET:  * No tourniquets in log *  DICTATION: .Other Dictation: Dictation Number [id number]  PLAN OF CARE: Admit to inpatient   PATIENT DISPOSITION:  PACU - hemodynamically stable.   Delay start of Pharmacological VTE agent (>24hrs) due to surgical blood loss or risk of bleeding: not applicable

## 2016-03-10 NOTE — Progress Notes (Signed)
PHARMACIST - PHYSICIAN ORDER COMMUNICATION  CONCERNING: P&T Medication Policy on Herbal Medications  DESCRIPTION:  This patient's order for:  CoQ10, glucosamine, cod liver oil  has been noted.  This product(s) is classified as an "herbal" or natural product. Due to a lack of definitive safety studies or FDA approval, nonstandard manufacturing practices, plus the potential risk of unknown drug-drug interactions while on inpatient medications, the Pharmacy and Therapeutics Committee does not permit the use of "herbal" or natural products of this type within [facility].   ACTION TAKEN: The pharmacy department is unable to verify this order at this time and your patient has been informed of this safety policy. Please reevaluate patient's clinical condition at discharge and address if the herbal or natural product(s) should be resumed at that time.  Erin Hearing PharmD., BCPS Clinical Pharmacist Pager [phone] 03/10/2016 4:09 PM

## 2016-03-10 NOTE — Anesthesia Procedure Notes (Signed)
Anesthesia Regional Block:  Interscalene brachial plexus block  Pre-Anesthetic Checklist: ,, timeout performed, Correct Patient, Correct Site, Correct Laterality, Correct Procedure, Correct Position, site marked, Risks and benefits discussed,  Surgical consent,  Pre-op evaluation,  At surgeon's request and post-op pain management  Laterality: Upper and Right  Prep: Betadine, chloraprep       Needles:  Injection technique: Single-shot  Needle Type: Stimulator Needle - 40     Needle Length: 4cm 4 cm Needle Gauge: 22 and 22 G  Needle insertion depth: 3 cm   Additional Needles:  Procedures: ultrasound guided (picture in chart) and nerve stimulator Interscalene brachial plexus block  Nerve Stimulator or Paresthesia:  Response: Twitch elicited, 0.5 mA, 0.3 ms,   Additional Responses:   Narrative:  Start time: 03/10/2016 9:35 AM End time: 03/10/2016 9:54 AM Injection made incrementally with aspirations every 5 mL.  Performed by: Personally  Anesthesiologist: Aniston Christman  Additional Notes: Block assessed prior to start of surgery

## 2016-03-10 NOTE — Transfer of Care (Signed)
Immediate Anesthesia Transfer of Care Note  Patient: Sheila Anderson  Procedure(s) Performed: Procedure(s): RIGHT REVERSE SHOULDER ARTHROPLASTY (Right)  Patient Location: PACU  Anesthesia Type:GA combined with regional for post-op pain  Level of Consciousness: awake, alert  and oriented  Airway & Oxygen Therapy: Patient Spontanous Breathing and Patient connected to nasal cannula oxygen  Post-op Assessment: Report given to RN and Post -op Vital signs reviewed and stable  Post vital signs: Reviewed and stable  Last Vitals:  Vitals:   03/10/16 1021 03/10/16 1319  BP: 131/78   Pulse: 73 90  Resp: 12 13  Temp:  36.4 C    Last Pain:  Vitals:   03/10/16 1319  TempSrc:   PainSc: 0-No pain         Complications: No apparent anesthesia complications

## 2016-03-10 NOTE — Interval H&P Note (Signed)
History and Physical Interval Note:  03/10/2016 10:22 AM  Sheila Anderson  has presented today for surgery, with the diagnosis of right shoulder rotator cuff arthropathy  The various methods of treatment have been discussed with the patient and family. After consideration of risks, benefits and other options for treatment, the patient has consented to  Procedure(s): RIGHT REVERSE SHOULDER ARTHROPLASTY (Right) as a surgical intervention .  The patient's history has been reviewed, patient examined, no change in status, stable for surgery.  I have reviewed the patient's chart and labs.  Questions were answered to the patient's satisfaction.     Brentney Goldbach,STEVEN R

## 2016-03-10 NOTE — Anesthesia Procedure Notes (Signed)
Procedure Name: Intubation Performed by: Maryland Pink Pre-anesthesia Checklist: Patient identified, Emergency Drugs available, Suction available, Patient being monitored and Timeout performed Patient Re-evaluated:Patient Re-evaluated prior to inductionOxygen Delivery Method: Circle system utilized Preoxygenation: Pre-oxygenation with 100% oxygen Intubation Type: IV induction Ventilation: Mask ventilation without difficulty Laryngoscope Size: Miller and 2 Grade View: Grade III Tube type: Oral Tube size: 7.0 mm Number of attempts: 2 Airway Equipment and Method: Stylet Placement Confirmation: ETT inserted through vocal cords under direct vision,  positive ETCO2 and breath sounds checked- equal and bilateral Secured at: 21 cm Tube secured with: Tape Dental Injury: Teeth and Oropharynx as per pre-operative assessment  Comments: DLx 1 A. Nelda Marseille, CRNA epiglottis visualized no attempt at passing ETT, DL x 2 T. Massagee, MD see above note

## 2016-03-10 NOTE — Progress Notes (Signed)
Pt "feels like I'm not getting a full breath". She has had ISNB. Chest movement symettrical, bilat breath sounds clear. Reassurance provided. Pt says she "has this all the time", and that it is unchanged from preop. Dr Orene Desanctis here & spoke with pt. Will cont to monitor & provide reassurance.

## 2016-03-11 MED ORDER — PROSIGHT PO TABS
1.0000 | ORAL_TABLET | Freq: Every day | ORAL | Status: DC
  Administered 2016-03-11: 1 via ORAL
  Filled 2016-03-11: qty 1

## 2016-03-11 NOTE — Discharge Summary (Signed)
Physician Discharge Summary   Patient ID: Sheila Anderson MRN: [id number] DOB/AGE: 10-Dec-1940 75 y.o.  Admit date: 03/10/2016 Discharge date: 03/11/2016  Admission Diagnoses:  Active Problems:   S/P shoulder replacement   Discharge Diagnoses:  Same   Surgeries: Procedure(s): RIGHT REVERSE SHOULDER ARTHROPLASTY on 03/10/2016   Consultants: OT  Discharged Condition: Stable  Hospital Course: Sheila Anderson is an 75 y.o. female who was admitted 03/10/2016 with a chief complaint of right shoulder pain, and found to have a diagnosis of right shoulder OA and rotator cuff insufficiency.  They were brought to the operating room on 03/10/2016 and underwent the above named procedures.    The patient had an uncomplicated hospital course and was stable for discharge.  Recent vital signs:  Vitals:   03/11/16 0232 03/11/16 0559  BP: 102/60 (!) 106/56  Pulse: 66 73  Resp:    Temp:  98.3 F (36.8 C)    Recent laboratory studies:  Results for orders placed or performed during the hospital encounter of 03/01/16  Surgical pcr screen  Result Value Ref Range   MRSA, PCR NEGATIVE NEGATIVE   Staphylococcus aureus NEGATIVE NEGATIVE  CBC  Result Value Ref Range   WBC 3.3 (L) 4.0 - 10.5 K/uL   RBC 3.86 (L) 3.87 - 5.11 MIL/uL   Hemoglobin 12.1 12.0 - 15.0 g/dL   HCT 37.5 36.0 - 46.0 %   MCV 97.2 78.0 - 100.0 fL   MCH 31.3 26.0 - 34.0 pg   MCHC 32.3 30.0 - 36.0 g/dL   RDW 13.4 11.5 - 15.5 %   Platelets 210 150 - 400 K/uL  Basic metabolic panel  Result Value Ref Range   Sodium 140 135 - 145 mmol/L   Potassium 4.0 3.5 - 5.1 mmol/L   Chloride 108 101 - 111 mmol/L   CO2 25 22 - 32 mmol/L   Glucose, Bld 80 65 - 99 mg/dL   BUN 9 6 - 20 mg/dL   Creatinine, Ser 0.70 0.44 - 1.00 mg/dL   Calcium 9.6 8.9 - 10.3 mg/dL   GFR calc non Af Amer >60 >60 mL/min   GFR calc Af Amer >60 >60 mL/min   Anion gap 7 5 - 15    Discharge Medications:     Medication List    TAKE these medications     cetirizine 10 MG tablet Commonly known as:  ZYRTEC Take 10 mg by mouth daily.   CO Q 10 PO Take 1 tablet by mouth daily.   COD LIVER OIL PO Take 1 capsule by mouth daily.   fluconazole 150 MG tablet Commonly known as:  DIFLUCAN Take 1 tablet (150 mg total) by mouth once. Repeat if needed   fluocinonide-emollient 0.05 % cream Commonly known as:  LIDEX-E Apply 1 application topically 2 (two) times daily.   GLUCOSAMINE MSM COMPLEX PO Take 1 capsule by mouth daily.   HYDROcodone-acetaminophen 5-325 MG tablet Commonly known as:  NORCO Take 1-2 tablets by mouth every 4 (four) hours as needed for moderate pain.   mometasone 50 MCG/ACT nasal spray Commonly known as:  NASONEX Place 2 sprays into the nose daily.   ONE-A-DAY WOMENS 50 PLUS PO Take 1 tablet by mouth daily. Reported on 11/23/2015   ICAPS AREDS 2 PO Take 1 capsule by mouth daily. Reported on 12/17/2015   SOY CARE MENOPAUSE PO Take 1 tablet by mouth 2 (two) times daily.   VITAMIN C PO Take 1 tablet by mouth as needed. Reported on 11/23/2015  VITAMIN E PO Take 1 capsule by mouth as needed.       Diagnostic Studies: Dg Shoulder Right Port  Result Date: 03/10/2016 CLINICAL DATA:  Status post glenohumeral arthroplasty EXAM: PORTABLE RIGHT SHOULDER COMPARISON:  None. FINDINGS: Single view of glenohumeral arthroplasty on the right shows congruent prosthetic joint without visible periprosthetic fracture. IMPRESSION: No acute finding after glenohumeral arthroplasty. Electronically Signed   By: Monte Fantasia M.D.   On: 03/10/2016 15:19    Disposition: 01-Home or Self Care    Follow-up Information    Keona Sheffler,STEVEN R, MD. Call in 2 weeks.   Specialty:  Orthopedic Surgery Why:  [phone] Contact information: [address] [place] [address] [phone]            Signed: Augustin Schooling 03/11/2016, 6:48 AM

## 2016-03-11 NOTE — Discharge Instructions (Signed)
Ice to the shoulder as much as you can.  Keep the incision clean and dry and covered for one week, then ok to get it wet in the shower.  Ok to use the right arm for gentle ADLs but no significant pushing or pulling.  Keep the arm propped across your waist while resting,  Ok to remove the sling as you would like.  Do exercises every hour while awake - lap slides, pendulums, gentle rotation (to stomach"hug" and then thumb out "hitchhike" to straight in front not to outside)  Follow up in two weeks  [phone]

## 2016-03-11 NOTE — Progress Notes (Signed)
Orthopedics Progress Note  Subjective: Comfortable this morning. C/o sore throat from intubation..taking chloraseptic lozenges  Objective:  Vitals:   03/11/16 0232 03/11/16 0559  BP: 102/60 (!) 106/56  Pulse: 66 73  Resp:    Temp:  98.3 F (36.8 C)    General: Awake and alert  Musculoskeletal: right shoulder dressing changed, wound benign, minimal swelling Neurovascularly intact  Lab Results  Component Value Date   WBC 3.3 (L) 03/01/2016   HGB 12.1 03/01/2016   HCT 37.5 03/01/2016   MCV 97.2 03/01/2016   PLT 210 03/01/2016       Component Value Date/Time   NA 140 03/01/2016 1103   K 4.0 03/01/2016 1103   CL 108 03/01/2016 1103   CO2 25 03/01/2016 1103   GLUCOSE 80 03/01/2016 1103   BUN 9 03/01/2016 1103   CREATININE 0.70 03/01/2016 1103   CREATININE 0.77 11/23/2015 1710   CALCIUM 9.6 03/01/2016 1103   GFRNONAA >60 03/01/2016 1103   GFRAA >60 03/01/2016 1103    Lab Results  Component Value Date   INR 0.86 01/25/2010   INR 0.98 01/06/2010    Assessment/Plan: POD #1 s/p Procedure(s): RIGHT REVERSE SHOULDER ARTHROPLASTY Stable overnight. Discharge planned for today after therapy. Follow up in two weeks  Doran Heater. Veverly Fells, MD 03/11/2016 6:45 AM

## 2016-03-11 NOTE — Op Note (Signed)
NAMEAMAURI, HELBLING NO.:  [id number]  MEDICAL RECORD NO.:  [id number]  LOCATION:  [id number]                        FACILITY:  [facility]  PHYSICIAN:  Doran Heater. Veverly Fells, M.D. DATE OF BIRTH:  [birth year]  DATE OF PROCEDURE:  03/10/2016 DATE OF DISCHARGE:                              OPERATIVE REPORT   PREOPERATIVE DIAGNOSIS:  Right shoulder rotator cuff tear arthropathy.  POSTOPERATIVE DIAGNOSIS:  Right shoulder rotator cuff tear arthropathy.  PROCEDURE PERFORMED:  Right shoulder reverse total shoulder arthroplasty using DePuy Delta Xtend prosthesis with subscapularis repair.  ATTENDING SURGEON:  Doran Heater. Veverly Fells, MD  ASSISTANT:  Abbott Pao. Dixon, PA-C who has scrubbed the entire procedure and necessary for satisfactory completion of surgery.  ANESTHESIA:  General anesthesia was used plus interscalene block.  ESTIMATED BLOOD LOSS:  Less than 100 mL.  FLUID REPLACED:  1500 mL crystalloid.  INSTRUMENT COUNT:  Correct.  COMPLICATIONS:  There were no complications.  ANTIBIOTICS:  Perioperative antibiotics were given.  INDICATIONS:  The patient is a 75 year old female with worsening right shoulder pain secondary to rotator cuff insufficiency and significant shoulder arthritis.  The patient has a pattern consistent with rotator cuff tear arthropathy with superior head migration and loss of fixed fulcrum mechanics.  Due to progressive nature of her pain and decreasing function, the patient was counseled regarding options for management, giving an absence of supraspinatus, infraspinatus, and most of subscapularis tendons.  Recommendation was made for reverse total shoulder arthroplasty to eliminate pain and restore function and to restore fixed fulcrum mechanics.  Risks and benefits of surgery were discussed.  Informed consent was obtained.  DESCRIPTION OF PROCEDURE:  After an adequate level of level of anesthesia was achieved, the patient was positioned in the  modified beach-chair position.  Right shoulder correctly identified and sterilely prepped and draped in usual manner.  Time-out was called.  Deltopectoral incision was created starting at the coracoid process extending down to the anterior humerus using a 10-blade scalpel.  Dissection down through subcutaneous tissues using Bovie electrocautery, identified cephalic vein, took it laterally with the deltoid, pectoralis was taken medially. Conjoint tendon identified, retracted medially.  Only the inferior portion of the subscapularis remained which we removed subperiosteally and tagged for repair at the end.  The superior portion was completely gone.  Supraspinatus infraspinatus were completely absent.  Biceps was tenodesed within the bicipital groove suturing through the pectoralis tendon insertion and guiding the biceps tendon as well.  We did 2 figure- of-eights to secure the biceps tendon.  We then resected the biceps proximally at the joint, extended the shoulder and the teres minor did seem to be intact, we left that in place.  We did enter the proximal humerus using a 6-mm reamer and reamed up to a size 12.  We then took a 12 mm intramedullary head resection guide and placed that in the shaft and then resected the head 10 degrees of retroversion.  Using oscillating saw, we removed excess spurs using a rongeur.  We then went ahead and prepared the metaphysis with the metaphyseal reamer, this was epi 1 right metaphyseal reamer guide and then went ahead and reamed for the epi-1 right metaphysis.  Once we had that reaming done, we went ahead and placed our trial component in place which was a 12 body epi-1 right metaphysis set on the 0 setting and placed in 10 degrees of retroversion.  We impacted that in position, marked our version and then removed that as the metal was fairly proud inferomedially and we would facilitate exposure.  We then went, did a 360-degree glenoid labrum removal  and capsular removal carefully protected the axillary nerve during this portion of procedure.  Once we were able to remove the capsule and labrum, then we found our center point on our glenoid face which was quite small.  We 1st removed the remaining cartilage with a Cobb elevator and then we placed our centered guide pin and then reamed for the metaglene.  Once we had that reaming done down to just the subchondral bone and not violating that, we drilled out the central PEG for the metaglene and once that was accomplished, we irrigated thoroughly removing bony debris and then impacted the real metaglene in position.  We were able to get a 48 screw inferiorly, 30 proximally into the base of the coracoid anteriorly and posteriorly due to just the small-sized glenoid.  We were concerned that a screw might undermine the bony support which was excellent for her metaglene.  With those 2 screws in place and the locking mechanisms locked, we then went ahead and placed a 38 standard glenosphere into position and went ahead impacted that and screwed that home.  Once that screw tightened, I did a finger sweep all the way around to make sure there was no tissue that was caught in that and that the nerve was free and clear which was.  At this point, we went ahead and directed our attention back towards the humerus, extended the shoulder again, irrigated the bony canal and selected the real press-fit HA coated 12 body and epi 1 right metaphysis, we tightened it down on the back table, set on the 0 setting and we are going to place in 10 degrees of retroversion.  Next, we placed 2 drill holes in the humerus to repair the subscapularis.  We placed #2 FiberWire through that.  Next, we went ahead and used a small amount of DePuy 1 cement just in the portion of the metaphyseal portion not where the cancellous bone was, but just below that where it flared into the shaft.  So, we placed a small amount of  cement there, then impacted the HA coated stem into place.  This will give a little bit more security with our fixation as the patient's bone was quite weak and osteoporotic feeling, so that small amount of cement would stay up near the flare between the metaphysis and diaphysis, but we still have the HA coating for healing of the bone onto the stem itself and the metaphyseal portion.  Once that was complete, we impacted that in position, it was quite stable.  I went ahead and selected a 38 +3 trial reduced the shoulder had nice snap to it, was tight with conjoin, no gapping with the pole and no gapping with external rotation, so we removed the trial 38+ 3 poly and then selected a 38+ 3 final poly insert and selected that and impacted that in position, reduced the shoulder and then did an anatomic subscapularis repair back to the medial humerus and the lesser tuberosity area.  We also sewed into the biceps area to gain some extra purchase, but this did  not tether the shoulder anyway and did not limit range of motion.  I was able to check the axillary nerve and trace it all the way down underneath the subscapularis muscle to make sure that was free and clear which was.  Final irrigation felt like we had nice stable shoulder, I was very excited to be able to repair some of that subscapularis as I think that will give her some additional function and strength, but she had push-off.  At this point, we closed with 0 Vicryl suture, interrupted simple suture for the deltopectoral closure.  We were careful to roll the cephalic vein underneath the muscle and then 2-0 Vicryl subcutaneous closure and 4-0 Monocryl for skin.  Steri-Strips applied followed by sterile dressing. The patient tolerated surgery well.     Doran Heater. Veverly Fells, M.D.     SRN/MEDQ  D:  03/10/2016  T:  03/11/2016  Job:  [id number]

## 2016-03-11 NOTE — Plan of Care (Signed)
Problem: Bowel/Gastric: Goal: Will not experience complications related to bowel motility Outcome: Progressing No bowel issues reported  Problem: Activity: Goal: Ability to tolerate increased activity will improve Outcome: Progressing Tolerates activities well  Problem: Physical Regulation: Goal: Postoperative complications will be avoided or minimized Outcome: Progressing No complications noted  Problem: Pain Management: Goal: Pain level will decrease with appropriate interventions Outcome: Progressing Pain well controlled with hydrocodone

## 2016-03-11 NOTE — Evaluation (Signed)
Occupational Therapy Evaluation Patient Details Name: Sheila Anderson MRN: [id number] DOB: [birth year] Today's Date: 03/11/2016    History of Present Illness this 75 y.o. female admitted for Rt reverse total shoulder arthroplasty.  PMH includes: recent L1 vertebral fx   Clinical Impression   Pt admitted with above. She demonstrates the below listed deficits below.  She requires min A for ADLs. All education was completed, and she was able to demonstrate understanding.  Spouse will be available to assist her at discharge.  Acute OT will sign off at this time.       Follow Up Recommendations  No OT follow up;Supervision/Assistance - 24 hour    Equipment Recommendations  None recommended by OT    Recommendations for Other Services       Precautions / Restrictions Precautions Precautions: Shoulder Type of Shoulder Precautions: conservative:  Gentle lap slides, ER/IR wand 0 degrees ER to abdomen only, pendulums Shoulder Interventions: Shoulder sling/immobilizer;Off for dressing/bathing/exercises Precaution Booklet Issued: Yes (comment) Precaution Comments: shoulder handout provided to pt  Restrictions Weight Bearing Restrictions: Yes RUE Weight Bearing: Non weight bearing      Mobility Bed Mobility               General bed mobility comments: Pt sitting up in chair.  Discussed that she may prefer to sleep in recliner initially upon discharge, but did instruct her to place pillow underneath Rt humerus when supine   Transfers Overall transfer level: Modified independent                    Balance Overall balance assessment: No apparent balance deficits (not formally assessed)                                          ADL Overall ADL's : Needs assistance/impaired Eating/Feeding: Modified independent   Grooming: Wash/dry hands;Wash/dry face;Oral care;Brushing hair;Supervision/safety;Standing   Upper Body Bathing: Supervision/  safety;Sitting Upper Body Bathing Details (indicate cue type and reason): Pt instructed in safe method for bathing Rt axilla  Lower Body Bathing: Supervison/ safety;Set up;Sit to/from stand   Upper Body Dressing : Minimal assistance;Sitting Upper Body Dressing Details (indicate cue type and reason): Reviewed methods for donning bra.  Instructed pt she likely would not want to wear bra initially due to shoulder pain.  She decided she will have spouse assist her once she decides to wear one.  Pt was able to don pullover v-neck t-shirt with min cues for technique.  Discussed that button front shirt would likely be easier and safer  Lower Body Dressing: Supervision/safety;Sit to/from stand   Toilet Transfer: Supervision/safety;Ambulation;Regular Toilet   Toileting- Water quality scientist and Hygiene: Supervision/safety;Sit to/from stand       Functional mobility during ADLs: Modified independent General ADL Comments: Pt instructed how to don/doff sling and was able to perform with supervision.       Vision     Perception     Praxis      Pertinent Vitals/Pain Pain Assessment: 0-10 Pain Score: 6  Pain Location: Rt shoulder  Pain Descriptors / Indicators: Aching;Grimacing;Guarding Pain Intervention(s): Monitored during session;Premedicated before session;Ice applied     Hand Dominance Right   Extremity/Trunk Assessment Upper Extremity Assessment Upper Extremity Assessment: RUE deficits/detail RUE Deficits / Details: s/p reverse TSA RUE: Unable to fully assess due to immobilization   Lower Extremity Assessment Lower Extremity Assessment: Overall [facility]  for tasks assessed   Cervical / Trunk Assessment Cervical / Trunk Assessment: Kyphotic (Mildly )   Communication Communication Communication: No difficulties   Cognition Arousal/Alertness: Awake/alert Behavior During Therapy: WFL for tasks assessed/performed Overall Cognitive Status: Within Functional Limits for tasks assessed                      General Comments       Exercises Exercises: Other exercises Other Exercises Other Exercises: Pt instructed in lap slides and able to return demonstration - performed 10 reps.  Pt with recent L1 fracture so did not instruct pt on pendulum exercises due to stress on Low back.  Pt also instructed in can exercises with ER to neutral/zero.  She was able to return demonstration.   Other Exercises: Pt reports Dr Veverly Fells instructed her to perform active abduction and FF, however, no orders noted for these exercises so therefore did not perform with pt, and instructed her to clarify with MD Other Exercises: Pt Performed 10 reps AROM elbow flex/ext, wrist flex/ext and finger flex/ext x 10 reps   Other Exercises: Pt performed head/neck flex/ext, rotation Lt and Rt and lateral flex Lt and Rt x 10    Shoulder Instructions      Home Living Family/patient expects to be discharged to:: Private residence Living Arrangements: Spouse/significant other Available Help at Discharge: Family;Available 24 hours/day Type of Home: House       Home Layout: Multi-level     Bathroom Shower/Tub: Tub/shower unit;Curtain Shower/tub characteristics: Architectural technologist: Standard     Home Equipment: Building services engineer Comments: Pt reports spouse will be able to assist as needed       Prior Functioning/Environment Level of Independence: Independent        Comments: Pt currently works as a Firefighter Problem List: Decreased strength;Decreased range of motion;Decreased knowledge of use of DME or AE;Decreased knowledge of precautions;Pain;Impaired UE functional use   OT Treatment/Interventions:      OT Goals(Current goals can be found in the care plan section) Acute Rehab OT Goals Patient Stated Goal: to play the cello  OT Goal Formulation: All assessment and education complete, DC therapy  OT Frequency:     Barriers to D/C:            Co-evaluation               End of Session Equipment Utilized During Treatment: Other (comment) (sling ) Nurse Communication: Mobility status  Activity Tolerance: Patient tolerated treatment well Patient left: in chair;with call bell/phone within reach   Time: 0902-0943 OT Time Calculation (min): 41 min Charges:  OT General Charges $OT Visit: 1 Procedure OT Evaluation $OT Eval Moderate Complexity: 1 Procedure OT Treatments $Self Care/Home Management : 8-22 mins $Therapeutic Exercise: 8-22 mins G-Codes:    Carrell Palmatier M 03/25/2016, 4:46 PM

## 2016-03-11 NOTE — Progress Notes (Signed)
Discharge instructions and prescription provided to the patient.  Additional bandages provided to the patient as requested by MD.  No questions at time of discharge.  Pain medication given per patient request.  Escorted via wheelchair.

## 2016-03-13 ENCOUNTER — Encounter (HOSPITAL_COMMUNITY): Payer: Self-pay | Admitting: Orthopedic Surgery

## 2016-03-23 NOTE — Anesthesia Postprocedure Evaluation (Signed)
Anesthesia Post Note  Patient: Sheila Anderson  Procedure(s) Performed: Procedure(s) (LRB): RIGHT REVERSE SHOULDER ARTHROPLASTY (Right)  Patient location during evaluation: PACU Anesthesia Type: General Level of consciousness: awake and alert Pain management: pain level controlled Vital Signs Assessment: post-procedure vital signs reviewed and stable Respiratory status: spontaneous breathing, nonlabored ventilation, respiratory function stable and patient connected to nasal cannula oxygen Cardiovascular status: blood pressure returned to baseline and stable Postop Assessment: no signs of nausea or vomiting Anesthetic complications: no    Last Vitals:  Vitals:   03/11/16 0232 03/11/16 0559  BP: 102/60 (!) 106/56  Pulse: 66 73  Resp:    Temp:  36.8 C    Last Pain:  Vitals:   03/11/16 1254  TempSrc:   PainSc: 8                  Jolynn Bajorek,JAMES TERRILL

## 2016-04-05 LAB — HM DEXA SCAN

## 2016-08-01 ENCOUNTER — Ambulatory Visit (INDEPENDENT_AMBULATORY_CARE_PROVIDER_SITE_OTHER): Payer: Medicare Other | Admitting: Family Medicine

## 2016-08-01 VITALS — BP 100/68 | HR 84 | Temp 98.4°F | Resp 18 | Ht 68.0 in | Wt 140.8 lb

## 2016-08-01 DIAGNOSIS — R21 Rash and other nonspecific skin eruption: Secondary | ICD-10-CM

## 2016-08-01 DIAGNOSIS — R300 Dysuria: Secondary | ICD-10-CM | POA: Diagnosis not present

## 2016-08-01 DIAGNOSIS — K432 Incisional hernia without obstruction or gangrene: Secondary | ICD-10-CM | POA: Diagnosis not present

## 2016-08-01 LAB — POC MICROSCOPIC URINALYSIS (UMFC): Mucus: ABSENT

## 2016-08-01 LAB — POCT URINALYSIS DIP (MANUAL ENTRY)
Bilirubin, UA: NEGATIVE
Glucose, UA: NEGATIVE
Ketones, POC UA: NEGATIVE
Leukocytes, UA: NEGATIVE
Nitrite, UA: NEGATIVE
PROTEIN UA: NEGATIVE
RBC UA: NEGATIVE
SPEC GRAV UA: 1.01
UROBILINOGEN UA: 0.2
pH, UA: 5.5

## 2016-08-01 MED ORDER — FLUOCINONIDE-E 0.05 % EX CREA: 1.0000 "application " | TOPICAL_CREAM | Freq: Two times a day (BID) | CUTANEOUS | 2 refills | Status: DC

## 2016-08-01 NOTE — Patient Instructions (Addendum)
IF you received an x-ray today, you will receive an invoice from [facility]. Please contact [facility] at [phone] with questions or concerns regarding your invoice.   IF you received labwork today, you will receive an invoice from [facility]. Please contact [facility] at [phone] with questions or concerns regarding your invoice.   Our billing staff will not be able to assist you with questions regarding bills from these companies.  You will be contacted with the lab results as soon as they are available. The fastest way to get your results is to activate your My Chart account. Instructions are located on the last page of this paperwork. If you have not heard from [place] regarding the results in 2 weeks, please contact this office.     Skin Biopsy, Care After Refer to this sheet in the next few weeks. These instructions provide you with information about caring for yourself after your procedure. Your health care provider may also give you more specific instructions. Your treatment has been planned according to current medical practices, but problems sometimes occur. Call your health care provider if you have any problems or questions after your procedure. What can I expect after the procedure? After the procedure, it is common to have:  Soreness.  Bruising.  Itching. Follow these instructions at home:  Rest and then return to your normal activities as told by your health care provider.  Take over-the-counter and prescription medicines only as told by your health care provider.  Follow instructions from your health care provider about how to take care of your biopsy site.Make sure you:  Wash your hands with soap and water before you change your bandage (dressing). If soap and water are not available, use hand sanitizer.  Change your dressing as told by your health care provider.  Leave stitches (sutures), skin glue, or adhesive strips in place. These skin  closures may need to stay in place for 2 weeks or longer. If adhesive strip edges start to loosen and curl up, you may trim the loose edges. Do not remove adhesive strips completely unless your health care provider tells you to do that. If the biopsy area bleeds, apply gentle pressure for 10 minutes.  Check your biopsy site every day for signs of infection. Check for:  More redness, swelling, or pain.  More fluid or blood.  Warmth.  Pus or a bad smell.  Keep all follow-up visits as told by your health care provider. This is important. Contact a health care provider if:  You have more redness, swelling, or pain around your biopsy site.  You have more fluid or blood coming from your biopsy site.  Your biopsy site feels warm to the touch.  You have pus or a bad smell coming from your biopsy site.  You have a fever. Get help right away if:  You have bleeding that does not stop with pressure or a dressing. This information is not intended to replace advice given to you by your health care provider. Make sure you discuss any questions you have with your health care provider. Document Released: 06/25/2015 Document Revised: 01/23/2016 Document Reviewed: 08/26/2014 Elsevier Interactive Patient Education  2017 [employer]  Ventral Hernia A ventral hernia is a bulge of tissue from inside the abdomen that pushes through a weak area of the muscles that form the front wall of the abdomen. The tissues inside the abdomen are inside a sac (peritoneum). These tissues include the small intestine, large intestine, and the  fatty tissue that covers the intestines (omentum). Sometimes, the bulge that forms a hernia contains intestines. Other hernias contain only fat. Ventral hernias do not go away without surgical treatment. There are several types of ventral hernias. You may have:  A hernia at an incision site from previous abdominal surgery (incisional hernia).  A hernia just above the belly  button (epigastric hernia), or at the belly button (umbilical hernia). These types of hernias can develop from heavy lifting or straining.  A hernia that comes and goes (reducible hernia). It may be visible only when you lift or strain. This type of hernia can be pushed back into the abdomen (reduced).  A hernia that traps abdominal tissue inside the hernia (incarcerated hernia). This type of hernia does not reduce.  A hernia that cuts off blood flow to the tissues inside the hernia (strangulated hernia). The tissues can start to die if this happens. This is a very painful bulge that cannot be reduced. A strangulated hernia is a medical emergency. What are the causes? This condition is caused by abdominal tissue putting pressure on an area of weakness in the abdominal muscles. What increases the risk? The following factors may make you more likely to develop this condition:  Being female.  Being 71 or older.  Being overweight or obese.  Having had previous abdominal surgery, especially if there was an infection after surgery.  Having had an injury to the abdominal wall.  Having had several pregnancies.  Having a buildup of fluid inside the abdomen (ascites). What are the signs or symptoms? The only symptom of a ventral hernia may be a painless bulge in the abdomen. A reducible hernia may be visible only when you strain, cough, or lift. Other symptoms may include:  Dull pain.  A feeling of pressure. Signs and symptoms of a strangulated hernia may include:  Increasing pain.  Nausea and vomiting.  Pain when pressing on the hernia.  The skin over the hernia turning red or purple.  Constipation.  Blood in the stool (feces). How is this diagnosed? This condition may be diagnosed based on:  Your symptoms.  Your medical history.  A physical exam. You may be asked to cough or strain while standing. These actions increase the pressure inside your abdomen and force the hernia  through the opening in your muscles. Your health care provider may try to reduce the hernia by pressing on it.  Imaging studies, such as an ultrasound or CT scan. How is this treated? This condition is treated with surgery. If you have a strangulated hernia, surgery is done as soon as possible. If your hernia is small and not incarcerated, you may be asked to lose some weight before surgery. Follow these instructions at home:  Follow instructions from your health care provider about eating or drinking restrictions.  If you are overweight, your health care provider may recommend that you increase your activity level and eat a healthier diet.  Do not lift anything that is heavier than 10 lb (4.5 kg).  Return to your normal activities as told by your health care provider. Ask your health care provider what activities are safe for you. You may need to avoid activities that increase pressure on your hernia.  Take over-the-counter and prescription medicines only as told by your health care provider.  Keep all follow-up visits as told by your health care provider. This is important. Contact a health care provider if:  Your hernia gets larger.  Your hernia becomes painful.  Get help right away if:  Your hernia becomes increasingly painful.  You have pain along with any of the following:  Changes in skin color in the area of the hernia.  Nausea.  Vomiting.  Fever. Summary  A ventral hernia is a bulge of tissue from inside the abdomen that pushes through a weak area of the muscles that form the front wall of the abdomen.  This condition is treated with surgery, which may be urgent depending on your hernia.  Do not lift anything that is heavier than 10 lb (4.5 kg), and follow activity instructions from your health care provider. This information is not intended to replace advice given to you by your health care provider. Make sure you discuss any questions you have with your health  care provider. Document Released: 05/15/2012 Document Revised: 01/14/2016 Document Reviewed: 01/14/2016 Elsevier Interactive Patient Education  2017 [employer].

## 2016-08-01 NOTE — Progress Notes (Signed)
Subjective:  This chart was scribed for Sheila Cheadle MD, by Tamsen Roers, at [facility].  This patient was seen in room 5 and the patient's care was started at 10:42 AM.   Chief Complaint  Patient presents with  . Rash    all over      Patient ID: Sheila Anderson, female    DOB: 1940-12-14, 76 y.o.   MRN: [id number]  HPI HPI Comments: Sheila Anderson is a 76 y.o. female who presents to the [facility] complaining of a rash. Patient had a rash for years which has been difficult to control and has no responded to antibiotics fungal creams or steroid ointments.  Rash were painless and did not fluoresce on woods lamp.  Largely intertriginous zones on axilla, gluteal cleft and creases in neck with erythematous scaling. At her last visit for this she was treated with a dose of fluconazole and was told that if it did not help, se would be restarted on steroid creams. The weekly Fluconazole and Vanose resolved the rash but when she stopped them, it rash was returning.   Patient states today that there have been all kinds of diagnoses but she feels that the rash is due to something internal.  She has never had her rash biopsied and would like to do so.  She put off the CT scan which she was told to get in the past due to her shoulder surgery but she would like one now as she still has a knot in her abdomen (mostly able to feel it when she coughs).  Patient states that it is painful at times. Her ultrasound showed that she did not have a hernia in her umbilicus.   Patient is having soreness and itching in her vaginal area and states that she would like to have a urine analysis to make sure that she does not have a UTI.  She would like this done to make sure it is or isn't the rash which is causing these symptoms as she has the rash near her genital area as well. She denies any bleeding after itching her rashes. Patient does not have a gynecologist and would like  to see one regarding her internal vaginal rash.   Patient has run out of her fluocinonide and would like a refill for this.     Past Medical History:  Diagnosis Date  . Allergy   . Anxiety   . Anxiety   . Arthritis   . Back pain   . Cataract   . Closed L1 vertebral fracture ([place])   . GERD (gastroesophageal reflux disease)   . History of hiatal hernia   . Neck pain   . Neuromuscular disorder ([place])   . Thyroid lesion     Current Outpatient Prescriptions on File Prior to Visit  Medication Sig Dispense Refill  . Ascorbic Acid (VITAMIN C PO) Take 1 tablet by mouth as needed. Reported on 11/23/2015    . cetirizine (ZYRTEC) 10 MG tablet Take 10 mg by mouth daily.    . COD LIVER OIL PO Take 1 capsule by mouth daily.    . Coenzyme Q10 (CO Q 10 PO) Take 1 tablet by mouth daily.    . fluocinonide-emollient (LIDEX-E) 0.05 % cream Apply 1 application topically 2 (two) times daily. 60 g 0  . Glucos-MSM-C-Mn-Ginger-Willow (GLUCOSAMINE MSM COMPLEX PO) Take 1 capsule by mouth daily.    . Multiple Vitamins-Minerals (ICAPS AREDS 2 PO)  Take 1 capsule by mouth daily. Reported on 12/17/2015    . Multiple Vitamins-Minerals (ONE-A-DAY WOMENS 50 PLUS PO) Take 1 tablet by mouth daily. Reported on 11/23/2015    . Soy Protein-Soy Isoflavone (SOY CARE MENOPAUSE PO) Take 1 tablet by mouth 2 (two) times daily.    Marland Kitchen VITAMIN E PO Take 1 capsule by mouth as needed.    Marland Kitchen HYDROcodone-acetaminophen (NORCO) 5-325 MG tablet Take 1-2 tablets by mouth every 4 (four) hours as needed for moderate pain. (Patient not taking: Reported on 08/01/2016) 40 tablet 0   No current facility-administered medications on file prior to visit.     Allergies  Allergen Reactions  . Robaxin [Methocarbamol] Other (See Comments)    Per patient shut down GI tract, but not sure  . Mobic [Meloxicam] Other (See Comments)    Makes her constipated   . Nickel Rash        Review of Systems  Constitutional: Negative for chills and fever.    Eyes: Negative for pain, redness and itching.  Respiratory: Negative for cough and shortness of breath.   Gastrointestinal: Negative for nausea and vomiting.  Musculoskeletal: Negative for neck pain and neck stiffness.  Skin: Positive for rash.  Neurological: Negative for syncope and speech difficulty.       Objective:   Physical Exam  Constitutional: She is oriented to person, place, and time. She appears well-developed and well-nourished. No distress.  HENT:  Head: Normocephalic and atraumatic.  Pulmonary/Chest: Effort normal. No respiratory distress.  Abdominal: Soft. Normal appearance and bowel sounds are normal. She exhibits no distension. There is no hepatosplenomegaly. There is no tenderness. A hernia is present. Hernia confirmed positive in the ventral area.    Genitourinary: Vagina normal. No labial fusion. There is rash and tenderness on the right labia. There is no lesion on the right labia. There is rash and tenderness on the left labia. There is no lesion on the left labia.  Neurological: She is alert and oriented to person, place, and time.  Skin: Rash noted.  Erythematous macular rash poorly defined with slight thickening and scaley in the center of the largest patch on left lateral neck. Some mild blanching erythema- bilateral axilla.    Psychiatric: She has a normal mood and affect. Her behavior is normal.   Vitals:   08/01/16 1010  BP: 100/68  Pulse: 84  Resp: 18  Temp: 98.4 F (36.9 C)  TempSrc: Oral  SpO2: 95%  Weight: 140 lb 12.8 oz (63.9 kg)  Height: 5\' 8"  (1.727 m)   Results for orders placed or performed in visit on 08/01/16  POCT urinalysis dipstick  Result Value Ref Range   Color, UA yellow yellow   Clarity, UA clear clear   Glucose, UA negative negative   Bilirubin, UA negative negative   Ketones, POC UA negative negative   Spec Grav, UA 1.010    Blood, UA negative negative   pH, UA 5.5    Protein Ur, POC negative negative   Urobilinogen,  UA 0.2    Nitrite, UA Negative Negative   Leukocytes, UA Negative Negative  POCT Microscopic Urinalysis ([facility])  Result Value Ref Range   WBC,UR,HPF,POC None None WBC/hpf   RBC,UR,HPF,POC None None RBC/hpf   Bacteria None None, Too numerous to count   Mucus Absent Absent   Epithelial Cells, UR Per Microscopy Few (A) None, Too numerous to count cells/hpf        Assessment & Plan:   1. Incisional hernia, without  obstruction or gangrene - reassured pt that as she is asymptomatic and it is small that watchful waiting is appropriate. Pt wanting to proceed with advanced imaging (CT) but I discouraged pt as radiation and cost seem unnecessary since this is clearly an incisional hernia w/o comp and no reason to undergo the risk of repair at this time. Offered gen surg c/s if pt would like second opinion but she declines for now.  2. 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  3. Dysuria   4. Perineal rash in female - nothing distinguishable on exam but causing severe dysuria - I suspect this is the same rash as on her axilla but refer to gyn for further eval.  5. Rash and nonspecific skin eruption     Orders Placed This Encounter  Procedures  . Ambulatory referral to Gynecology    Referral Priority:   Routine    Referral Type:   Consultation    Referral Reason:   Specialty Services Required    Requested Specialty:   Gynecology    Number of Visits Requested:   1  . Care order/instruction:    Scheduling Instructions:     Complete orders, AVS and go.  Marland Kitchen POCT urinalysis dipstick  . POCT Microscopic Urinalysis ([facility])    Meds ordered this encounter  Medications  . alendronate (FOSAMAX) 70 MG tablet    Sig: Take 70 mg by mouth once a week. Take with a full glass of water on an empty stomach.  . ergocalciferol (VITAMIN D2) 50000 units capsule    Sig: Take 50,000 Units by mouth once a  week.  . Calcium Carb-Cholecalciferol (CALCIUM 500/D) 500-400 MG-UNIT CHEW    Sig: Chew by mouth.  . fluocinonide-emollient (LIDEX-E) 0.05 % cream    Sig: Apply 1 application topically 2 (two) times daily.    Dispense:  60 g    Refill:  2    I personally performed the services described in this documentation, which was scribed in my presence. The recorded information has been reviewed and considered, and addended by me as needed.   Sheila Anderson, M.D.  Primary Care at [facility] [address] [place], [place] [address] [phone] phone [phone] fax  08/01/16 7:26 PM

## 2016-08-03 ENCOUNTER — Telehealth: Payer: Self-pay

## 2016-08-03 NOTE — Telephone Encounter (Signed)
[facility] called to verify a source (source number 2) if it is right or left.  Please advise  [phone], ask for Almyra Free

## 2016-08-03 NOTE — Telephone Encounter (Signed)
Called back.  Documentation shows left lateral neck.  Brantley Fling of this. Philis Fendt, MS, PA-C 11:36 AM, 08/03/2016

## 2016-08-22 ENCOUNTER — Ambulatory Visit (INDEPENDENT_AMBULATORY_CARE_PROVIDER_SITE_OTHER): Payer: Medicare Other | Admitting: Gynecology

## 2016-08-22 ENCOUNTER — Encounter: Payer: Self-pay | Admitting: Gynecology

## 2016-08-22 VITALS — BP 114/72 | Ht 68.0 in | Wt 142.0 lb

## 2016-08-22 DIAGNOSIS — M810 Age-related osteoporosis without current pathological fracture: Secondary | ICD-10-CM

## 2016-08-22 DIAGNOSIS — R21 Rash and other nonspecific skin eruption: Secondary | ICD-10-CM

## 2016-08-22 DIAGNOSIS — Z01411 Encounter for gynecological examination (general) (routine) with abnormal findings: Secondary | ICD-10-CM | POA: Diagnosis not present

## 2016-08-22 DIAGNOSIS — Z124 Encounter for screening for malignant neoplasm of cervix: Secondary | ICD-10-CM

## 2016-08-22 DIAGNOSIS — N3281 Overactive bladder: Secondary | ICD-10-CM

## 2016-08-22 MED ORDER — TOLTERODINE TARTRATE ER 2 MG PO CP24: 2.0000 mg | ORAL_CAPSULE | Freq: Every day | ORAL | 11 refills | Status: DC

## 2016-08-22 NOTE — Progress Notes (Signed)
Sheila Anderson 23-Jun-1940 [id number]   History:    76 y.o.  for annual gyn exam who is a new patient to the practice was referred by her PCP Dr. [name]. Patient has not had a gynecological examination many years. Patient also had been complaining of perianal rash sometimes occurs at the nape of her neck and underneath her breasts. Patient had biopsy of the skin lesion by her PCP several weeks ago I was able to poor her pathology report which demonstrated the following: Diagnosis 1. Skin , right anterior axilla SPONGIOTIC DERMATITIS, SEE DESCRIPTION 2. Skin , left lateral neck SPONGIOTIC DERMATITIS, SEE DESCRIPTION Microscopic Description 1. ,2. There is a superficial perivascular inflammatory infiltrate composed of lymphocytes, histiocytes and scattered eosinophils. Lymphocytes extend to the epidermis where there is spongiosis and overlying parakeratosis. Following review of the hematoxylin and eosin sections, a PAS stain was obtained to exclude a fungal infection. The pas stain is negative for fungal organisms. The findings are those of a spongiotic dermatitis compatible with eczema or contact dermatitis.  Patient has been using Lidex-E0.05 percent but this area is coming go with time.  Patient denies any past history of any abnormal Pap smear and has never been on any hormone replacement therapy. The only GYN procedures she's had in the past and 2 normal spontaneous vaginal deliveries. Patient 2017 had been diagnosed with osteoporosis whereas her lowest T score was at the left femoral neck with a value of -2.9 patient currently on Fosamax and vitamin D. Patient recently was treated for vitamin D deficiency.  Patient has informed me that she has urinary frequency and urgency if she doesn't get to the bathroom time she may leak some urine. Also she gets up in the middle the night several times to urinate.  Patient several years ago had laparoscopic bowel resection pathology report was to  bladder adenoma she is due for colonoscopy this year.  Past medical history,surgical history, family history and social history were all reviewed and documented in the EPIC chart.  Gynecologic History No LMP recorded. Patient is postmenopausal. Contraception: post menopausal status Last Pap: Many years ago. Results were: normal Last mammogram: 12 months ago. Results were: normal  Obstetric History OB History  Gravida Para Term Preterm AB Living  2 2       2   SAB TAB Ectopic Multiple Live Births               # Outcome Date GA Lbr Len/2nd Weight Sex Delivery Anes PTL Lv  2 Para           1 Para                ROS: A ROS was performed and pertinent positives and negatives are included in the history.  GENERAL: No fevers or chills. HEENT: No change in vision, no earache, sore throat or sinus congestion. NECK: No pain or stiffness. CARDIOVASCULAR: No chest pain or pressure. No palpitations. PULMONARY: No shortness of breath, cough or wheeze. GASTROINTESTINAL: No abdominal pain, nausea, vomiting or diarrhea, melena or bright red blood per rectum. GENITOURINARY: No urinary frequency, urgency, hesitancy or dysuria. MUSCULOSKELETAL: No joint or muscle pain, no back pain, no recent trauma. DERMATOLOGIC: No rash, no itching, no lesions. ENDOCRINE: No polyuria, polydipsia, no heat or cold intolerance. No recent change in weight. HEMATOLOGICAL: No anemia or easy bruising or bleeding. NEUROLOGIC: No headache, seizures, numbness, tingling or weakness. PSYCHIATRIC: No depression, no loss of interest in normal activity or  change in sleep pattern.     Exam: chaperone present  BP 114/72   Ht 5\' 8"  (1.727 m)   Wt 142 lb (64.4 kg)   BMI 21.59 kg/m   Body mass index is 21.59 kg/m.  General appearance : Well developed well nourished female. No acute distress HEENT: Eyes: no retinal hemorrhage or exudates,  Neck supple, trachea midline, no carotid bruits, no thyroidmegaly Lungs: Clear to  auscultation, no rhonchi or wheezes, or rib retractions  Heart: Regular rate and rhythm, no murmurs or gallops Breast:Examined in sitting and supine position were symmetrical in appearance, no palpable masses or tenderness,  no skin retraction, no nipple inversion, no nipple discharge, no skin discoloration, no axillary or supraclavicular lymphadenopathy Abdomen: no palpable masses or tenderness, no rebound or guarding Extremities: no edema or skin discoloration or tenderness  Pelvic: External genitalia erythematous such as rash from contact dermatitis her chronic irritation no scales or plaques  Bartholin, Urethra, Skene Glands: Within normal limits             Vagina: No gross lesions or discharge, atrophic changes  Cervix: No gross lesions or discharge  Uterus  anteverted, normal size, shape and consistency, non-tender and mobile  Adnexa  Without masses or tenderness  Anus and perineum  normal   Rectovaginal  normal sphincter tone without palpated masses or tenderness             Hemoccult PCP provides     Assessment/Plan:  76 y.o. female for annual exam with chronic vulvar irritation with diagnoses as described above chronic dermatitis. Patient to continue to use the Lidex twice a day for 7-10 days to clear up the current recurrent irritation and then to apply 2 times a week for maintenance. Also because of her overactive bladder symptoms (detrusor dyssynergia) we discussed treatment with an anticholinergic. Patient has no history of glaucoma and for this reason we'll going to prescribe her Detrol LA 2 mg daily. The risks benefits and pros and cons of medication were discussed and literature information was provided. Pap smear with HPV screening was done today. She was reminded to schedule her colonoscopy this year as well as her mammogram. She is due for her next bone density study next year since she's responding to her Fosamax which her PCP has been ordering.   An additional 15 minutes  was spent discussing overactive bladder diagnoses management and treatment as well as osteoporosis and vulvar dystrophy/chronic dermatitis   Terrance Mass MD, 12:02 PM 08/22/2016

## 2016-08-22 NOTE — Patient Instructions (Signed)

## 2016-08-22 NOTE — Addendum Note (Signed)
Addended by: Nelva Nay on: 08/22/2016 12:15 PM   Modules accepted: Orders

## 2016-08-23 LAB — PAP IG W/ RFLX HPV ASCU

## 2016-08-29 ENCOUNTER — Encounter: Payer: Self-pay | Admitting: Family Medicine

## 2016-09-18 ENCOUNTER — Telehealth: Payer: Self-pay | Admitting: Family Medicine

## 2016-09-18 NOTE — Telephone Encounter (Signed)
lmom for pt to schedule an appointment with Brigitte Pulse on Saturday shell be here until closing

## 2016-09-23 ENCOUNTER — Ambulatory Visit (INDEPENDENT_AMBULATORY_CARE_PROVIDER_SITE_OTHER): Payer: Medicare Other | Admitting: Family Medicine

## 2016-09-23 ENCOUNTER — Encounter: Payer: Self-pay | Admitting: Family Medicine

## 2016-09-23 VITALS — BP 119/66 | HR 74 | Temp 98.2°F | Resp 17 | Ht 68.0 in | Wt 140.4 lb

## 2016-09-23 DIAGNOSIS — L301 Dyshidrosis [pompholyx]: Secondary | ICD-10-CM | POA: Diagnosis not present

## 2016-09-23 DIAGNOSIS — L308 Other specified dermatitis: Secondary | ICD-10-CM | POA: Diagnosis not present

## 2016-09-23 DIAGNOSIS — L2082 Flexural eczema: Secondary | ICD-10-CM

## 2016-09-23 DIAGNOSIS — L309 Dermatitis, unspecified: Secondary | ICD-10-CM

## 2016-09-23 MED ORDER — CLOBETASOL PROPIONATE 0.05 % EX CREA: 1.0000 "application " | TOPICAL_CREAM | Freq: Two times a day (BID) | CUTANEOUS | 2 refills | Status: DC

## 2016-09-23 MED ORDER — TRIAMCINOLONE 0.1 % CREAM:EUCERIN CREAM 1:1: 1.0000 "application " | TOPICAL_CREAM | Freq: Two times a day (BID) | CUTANEOUS | 11 refills | Status: DC

## 2016-09-23 MED ORDER — PREDNISONE 10 MG PO TABS
ORAL_TABLET | ORAL | 0 refills | Status: DC
Start: 2016-09-23 — End: 2018-01-09

## 2016-09-23 NOTE — Patient Instructions (Addendum)
Apply the clobestasol cream to the worst places until those rashes are improving.  Apply the triamcinolone mixture to all over all of the rashes twice a day until they are all completely gone. When you are NOT having any rashes, make sure you are apply a good hypoallergenic thick moisturizer cream such as Eucerin, Cedaphil, or Aquaphor and apply this continually twice a day - especially immediately after showering to prevent it from coming back. The moment your skin starts getting dry, you will be at increased risk of this coming back.  General measures for eczema - In clinical experience, avoidance of irritants or exacerbating factors is beneficial for most patients with dyshidrotic eczema. General skin care measures aimed at reducing skin irritation and restoring the skin barrier include:  Using lukewarm water and soap-free cleansers to wash hands  Drying hands thoroughly after washing  Applying emollients (eg, petroleum jelly) immediately after hand drying and as often as possible  Wearing cotton gloves under vinyl or other nonlatex gloves when performing wet work  Removing rings and watches and bracelets before wet work  Wearing protective gloves in cold weather  Wearing task-specific gloves for frictional exposures (eg, gardening, carpentry)  Avoiding exposure to irritants (eg, detergents, solvents, hair lotions or dyes, acidic foods [eg, citrus fruit])    Meds ordered this encounter  Medications  . predniSONE (DELTASONE) 10 MG tablet    Sig: 6-5-4-3-2-1 tab po qd    Dispense:  21 tablet    Refill:  0  . Triamcinolone Acetonide (TRIAMCINOLONE 0.1 % CREAM : EUCERIN) CREA    Sig: Apply 1 application topically 2 (two) times daily.    Dispense:  1 each    Refill:  11    Please max an EXTRA LARGE jar for pt as she needs to apply it twice a day over most of body  . clobetasol cream (TEMOVATE) 0.05 %    Sig: Apply 1 application topically 2 (two) times daily. To worst of rash    Dispense:  60  g    Refill:  2      IF you received an x-ray today, you will receive an invoice from [facility]. Please contact [facility] at [phone] with questions or concerns regarding your invoice.   IF you received labwork today, you will receive an invoice from [place]. Please contact [facility] at [phone] with questions or concerns regarding your invoice.   Our billing staff will not be able to assist you with questions regarding bills from these companies.  You will be contacted with the lab results as soon as they are available. The fastest way to get your results is to activate your My Chart account. Instructions are located on the last page of this paperwork. If you have not heard from [place] regarding the results in 2 weeks, please contact this office.     Eczema Eczema, also called atopic dermatitis, is a skin disorder that causes inflammation of the skin. It causes a red rash and dry, scaly skin. The skin becomes very itchy. Eczema is generally worse during the cooler winter months and often improves with the warmth of summer. Eczema usually starts showing signs in infancy. Some children outgrow eczema, but it may last through adulthood. What are the causes? The exact cause of eczema is not known, but it appears to run in families. People with eczema often have a family history of eczema, allergies, asthma, or hay fever. Eczema is not contagious. Flare-ups of the condition may be caused  by:  Contact with something you are sensitive or allergic to.  Stress. What are the signs or symptoms?  Dry, scaly skin.  Red, itchy rash.  Itchiness. This may occur before the skin rash and may be very intense. How is this diagnosed? The diagnosis of eczema is usually made based on symptoms and medical history. How is this treated? Eczema cannot be cured, but symptoms usually can be controlled with treatment and other strategies. A treatment plan might include:  Controlling  the itching and scratching.  Use over-the-counter antihistamines as directed for itching. This is especially useful at night when the itching tends to be worse.  Use over-the-counter steroid creams as directed for itching.  Avoid scratching. Scratching makes the rash and itching worse. It may also result in a skin infection (impetigo) due to a break in the skin caused by scratching.  Keeping the skin well moisturized with creams every day. This will seal in moisture and help prevent dryness. Lotions that contain alcohol and water should be avoided because they can dry the skin.  Limiting exposure to things that you are sensitive or allergic to (allergens).  Recognizing situations that cause stress.  Developing a plan to manage stress. Follow these instructions at home:  Only take over-the-counter or prescription medicines as directed by your health care provider.  Do not use anything on the skin without checking with your health care provider.  Keep baths or showers short (5 minutes) in warm (not hot) water. Use mild cleansers for bathing. These should be unscented. You may add nonperfumed bath oil to the bath water. It is best to avoid soap and bubble bath.  Immediately after a bath or shower, when the skin is still damp, apply a moisturizing ointment to the entire body. This ointment should be a petroleum ointment. This will seal in moisture and help prevent dryness. The thicker the ointment, the better. These should be unscented.  Keep fingernails cut short. Children with eczema may need to wear soft gloves or mittens at night after applying an ointment.  Dress in clothes made of cotton or cotton blends. Dress lightly, because heat increases itching.  A child with eczema should stay away from anyone with fever blisters or cold sores. The virus that causes fever blisters (herpes simplex) can cause a serious skin infection in children with eczema. Contact a health care provider  if:  Your itching interferes with sleep.  Your rash gets worse or is not better within 1 week after starting treatment.  You see pus or soft yellow scabs in the rash area.  You have a fever.  You have a rash flare-up after contact with someone who has fever blisters. This information is not intended to replace advice given to you by your health care provider. Make sure you discuss any questions you have with your health care provider. Document Released: 05/26/2000 Document Revised: 11/04/2015 Document Reviewed: 12/30/2012 Elsevier Interactive Patient Education  2017 [place] Dermatitis Hand dermatitis is a skin condition that causes small, itchy, raised dots or fluid-filled blisters to form over the palms of the hands. This condition may also be called hand eczema. What are the causes? The cause of this condition is not known. What increases the risk? This condition is more likely to develop in people who have a history of allergies, such as:  Hay fever.  Allergic asthma.  An allergy to latex. Chemical exposure, injuries, and environmental irritants can make hand dermatitis worse. Washing your hands too  often can remove natural oils, which can dry out the skin and contribute to outbreaks of this condition. What are the signs or symptoms? The most common symptom of this condition is intense itchiness. Cracks or grooves (fissures) on the fingers can also develop. Affected areas can be painful, especially areas where large blisters have formed. How is this diagnosed? This condition is diagnosed with a medical history and physical exam. How is this treated? This condition is treated with medicines, including:  Steroid creams and ointments.  Oral steroid medicines.  Antibiotic medicines. These are prescribed if you have an infection.  Antihistamine medicines. These help to reduce itchiness. Follow these instructions at home:  Take or apply over-the-counter and  prescription medicines only as told by your health care provider.  If you were prescribed an antibiotic medicine, use it as told by your health care provider. Do not stop using the antibiotic even if you start to feel better.  Avoid washing your hands more often than necessary.  Avoid using harsh chemicals on your hands.  Wear protective gloves when you handle products that can irritate your skin.  Keep all follow-up visits as told by your health care provider. This is important. Contact a health care provider if:  Your rash does not improve during the first week of treatment.  Your rash is red or tender.  Your rash has pus coming from it.  Your rash spreads. This information is not intended to replace advice given to you by your health care provider. Make sure you discuss any questions you have with your health care provider. Document Released: 05/29/2005 Document Revised: 11/04/2015 Document Reviewed: 12/11/2014 Elsevier Interactive Patient Education  2017 [employer].

## 2016-09-23 NOTE — Progress Notes (Signed)
By signing my name below, I, Sheila Anderson, attest that this documentation has been prepared under the direction and in the presence of Sheila Cheadle, MD.  Electronically Signed: Verlee Anderson, Medical Scribe. 09/23/16. 11:07 AM.  Subjective:    Patient ID: Sheila Anderson, female    DOB: 06-Feb-1941, 76 y.o.   MRN: [id number]  HPI Chief Complaint  Patient presents with  . Rash    recheck rash on bottom, on neck, & under ears.    HPI Comments: Sheila Anderson is a 76 y.o. female who presents to the Primary Care at [facility] complaining of rash. She was seen 2 months ago for chronic rash that was periodically responsive to topical steriods and abx over the years. Has biopsies of right anterior axillary and left lateral neck which both return showing a spongiotic dermatitis such as eczema and contact dermatitis. Pt was treated with lidex cream which she had responded well to in the past.  Reports no relief of her sxs with lidex on her neck and buttock and she's had intermittent cracked/dry rash on her hand. Reports experiencing hives on her face and took benadryl as well as cortisone cream for little relief of her sxs. She mentions when she wears nickel jewelry she'll get a rash, but she hasn't been wearing jewelry. She suspects the rash behind her ears are from the new glasses, which may be made out of nickel, she got after cataracts surgery since they sit around the region where her glasses sit on her face. Pt is using zyrtec daily and she uses a moisturizing lotion for her sxs. She has tried clobetasol for her rash in the past without relief of her sxs. She noted her allergies this season are beginning, noting rhinorrhea in the morning. When she went to the gyneocologist she was rx'ed "toteric" for urinary urgency. Pt went to the allergist in the past and tested positive for for the things she expected to be allergic to such as wheat and pollen. Pt does not avoid wheat and only  ingest small amounts of it. Pt doesn't remember being on a course of prednisone, but it wouldn't surprise her if she had been since she's had so many allergies. Pt has GERD, but she doesn't experience it often.    Patient Active Problem List   Diagnosis Date Noted  . S/P shoulder replacement 03/10/2016  . Lumbosacral radiculopathy 02/09/2016  . Left shoulder pain 01/12/2015   Past Medical History:  Diagnosis Date  . Allergy   . Anxiety   . Anxiety   . Arthritis   . Back pain   . Cataract   . Closed L1 vertebral fracture ([place])   . GERD (gastroesophageal reflux disease)   . History of hiatal hernia   . Neck pain   . Neuromuscular disorder ([place])   . Thyroid lesion    Past Surgical History:  Procedure Laterality Date  . CATARACT EXTRACTION Left    10 yrs ago  . cyst removal from bladder    . EYE SURGERY Left   . laproscopy     poly in colon  . REVERSE SHOULDER ARTHROPLASTY Right 03/10/2016   Procedure: RIGHT REVERSE SHOULDER ARTHROPLASTY;  Surgeon: Netta Cedars, MD;  Location: [facility];  Service: Orthopedics;  Laterality: Right;  . wrinkle in retina Left    Allergies  Allergen Reactions  . Robaxin [Methocarbamol] Other (See Comments)    Per patient shut down GI tract, but not sure  .  Mobic [Meloxicam] Other (See Comments)    Makes her constipated   . Nickel Rash   Prior to Admission medications   Medication Sig Start Date End Date Taking? Authorizing Provider  alendronate (FOSAMAX) 70 MG tablet Take 70 mg by mouth once a week. Take with a full glass of water on an empty stomach.   Yes Historical Provider, MD  Ascorbic Acid (VITAMIN C PO) Take 1 tablet by mouth as needed. Reported on 11/23/2015   Yes Historical Provider, MD  Calcium Carb-Cholecalciferol (CALCIUM 500/D) 500-400 MG-UNIT CHEW Chew by mouth.   Yes Historical Provider, MD  cetirizine (ZYRTEC) 10 MG tablet Take 10 mg by mouth daily.   Yes Historical Provider, MD  COD LIVER OIL PO Take 1 capsule by mouth daily.   Yes  Historical Provider, MD  Coenzyme Q10 (CO Q 10 PO) Take 1 tablet by mouth daily.   Yes Historical Provider, MD  ergocalciferol (VITAMIN D2) 50000 units capsule Take 50,000 Units by mouth once a week.   Yes Historical Provider, MD  fluocinonide-emollient (LIDEX-E) 0.05 % cream Apply 1 application topically 2 (two) times daily. 08/01/16  Yes Shawnee Knapp, MD  Glucos-MSM-C-Mn-Ginger-Willow (GLUCOSAMINE MSM COMPLEX PO) Take 1 capsule by mouth daily.   Yes Historical Provider, MD  Multiple Vitamins-Minerals (ICAPS AREDS 2 PO) Take 1 capsule by mouth daily. Reported on 12/17/2015   Yes Historical Provider, MD  Multiple Vitamins-Minerals (ONE-A-DAY WOMENS 50 PLUS PO) Take 1 tablet by mouth daily. Reported on 11/23/2015   Yes Historical Provider, MD  Soy Protein-Soy Isoflavone (SOY CARE MENOPAUSE PO) Take 1 tablet by mouth 2 (two) times daily.   Yes Historical Provider, MD  tolterodine (DETROL LA) 2 MG 24 hr capsule Take 1 capsule (2 mg total) by mouth daily. 08/22/16  Yes Terrance Mass, MD  VITAMIN E PO Take 1 capsule by mouth as needed.   Yes Historical Provider, MD   Social History   Social History  . Marital status: Married    Spouse name: N/A  . Number of children: 2  . Years of education: Masters   Occupational History  . [employer]   . Music Teacher    Social History Main Topics  . Smoking status: Never Smoker  . Smokeless tobacco: Never Used  . Alcohol use 0.6 oz/week    1 Standard drinks or equivalent per week     Comment: occasionally  . Drug use: No  . Sexual activity: Not Currently   Other Topics Concern  . Not on file   Social History Narrative   Lives at home with husband, Sheila Anderson.   Right-handed.   1-2 cups caffeine per day.   Review of Systems  HENT: Positive for rhinorrhea.   Skin: Positive for rash.  Allergic/Immunologic: Positive for environmental allergies and food allergies.   Objective:  Physical Exam  Constitutional: She appears well-developed and well-nourished.  No distress.  HENT:  Head: Normocephalic and atraumatic.  Eyes: Conjunctivae are normal.  Neck: Neck supple.  Cardiovascular: Normal rate.   Pulmonary/Chest: Effort normal.  Neurological: She is alert.  Skin: Skin is warm and dry.  Left 4th DIP joint with edema and swelling and crack on the dorsal aspect on the right intertriginous in the dorsum between 1st and 2nd - triangular crack with excorriations  Psychiatric: She has a normal mood and affect. Her behavior is normal.  Nursing note and vitals reviewed.   Vitals:   09/23/16 1031  BP: 119/66  Pulse: 74  Resp: 17  Temp: 98.2 F (36.8 C)  TempSrc: Oral  SpO2: 97%  Weight: 140 lb 6 oz (63.7 kg)  Height: 5\' 8"  (1.727 m)  Body mass index is 21.34 kg/m. Assessment & Plan:  Pt is on lidex-E, which is a group 3 preporation, so we'll change to group 1 preporation.  1. Flexural eczema   2. Eczema of both hands   3. Dyshidrotic eczema   4. Spongiotic dermatitis     Meds ordered this encounter  Medications  . predniSONE (DELTASONE) 10 MG tablet    Sig: 6-5-4-3-2-1 tab po qd    Dispense:  21 tablet    Refill:  0  . Triamcinolone Acetonide (TRIAMCINOLONE 0.1 % CREAM : EUCERIN) CREA    Sig: Apply 1 application topically 2 (two) times daily.    Dispense:  1 each    Refill:  11    Please max an EXTRA LARGE jar for pt as she needs to apply it twice a day over most of body  . clobetasol cream (TEMOVATE) 0.05 %    Sig: Apply 1 application topically 2 (two) times daily. To worst of rash    Dispense:  60 g    Refill:  2    I personally performed the services described in this documentation, which was scribed in my presence. The recorded information has been reviewed and considered, and addended by me as needed.   Sheila Anderson, M.D.  Primary Care at [facility] [address] [place], [place] [address] [phone] phone [phone] fax  10/08/16 10:09 PM

## 2016-10-25 ENCOUNTER — Encounter: Payer: Self-pay | Admitting: Gynecology

## 2016-11-30 ENCOUNTER — Ambulatory Visit (INDEPENDENT_AMBULATORY_CARE_PROVIDER_SITE_OTHER): Payer: Medicare Other | Admitting: Family Medicine

## 2017-05-24 IMAGING — CR DG HIP (WITH OR WITHOUT PELVIS) 2-3V*R*
2 series · 2 of 2 positions shown · non-contrast
Comparison: Pelvic CT 02/18/2009.

CLINICAL DATA: Right hip and knee pain.  Initial encounter.

EXAM:
DG HIP (WITH OR WITHOUT PELVIS) 2-3V RIGHT

[AP]
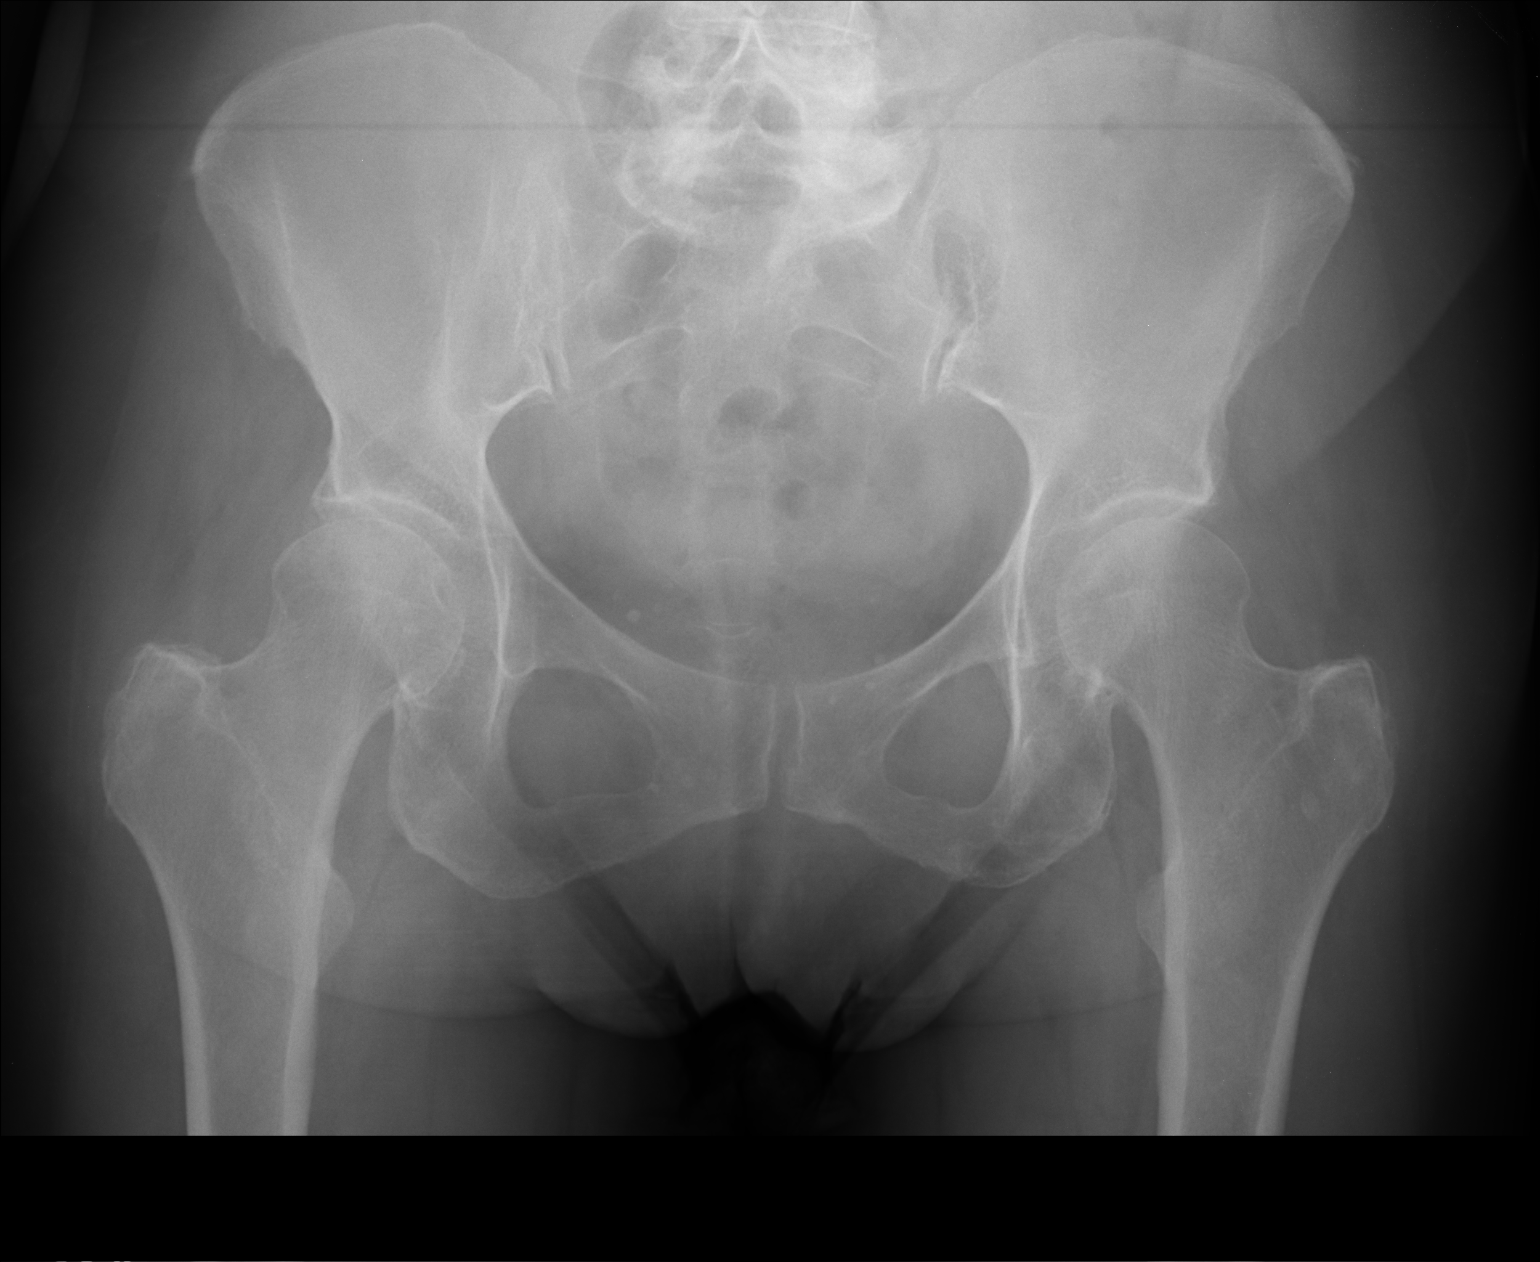

[lateral]
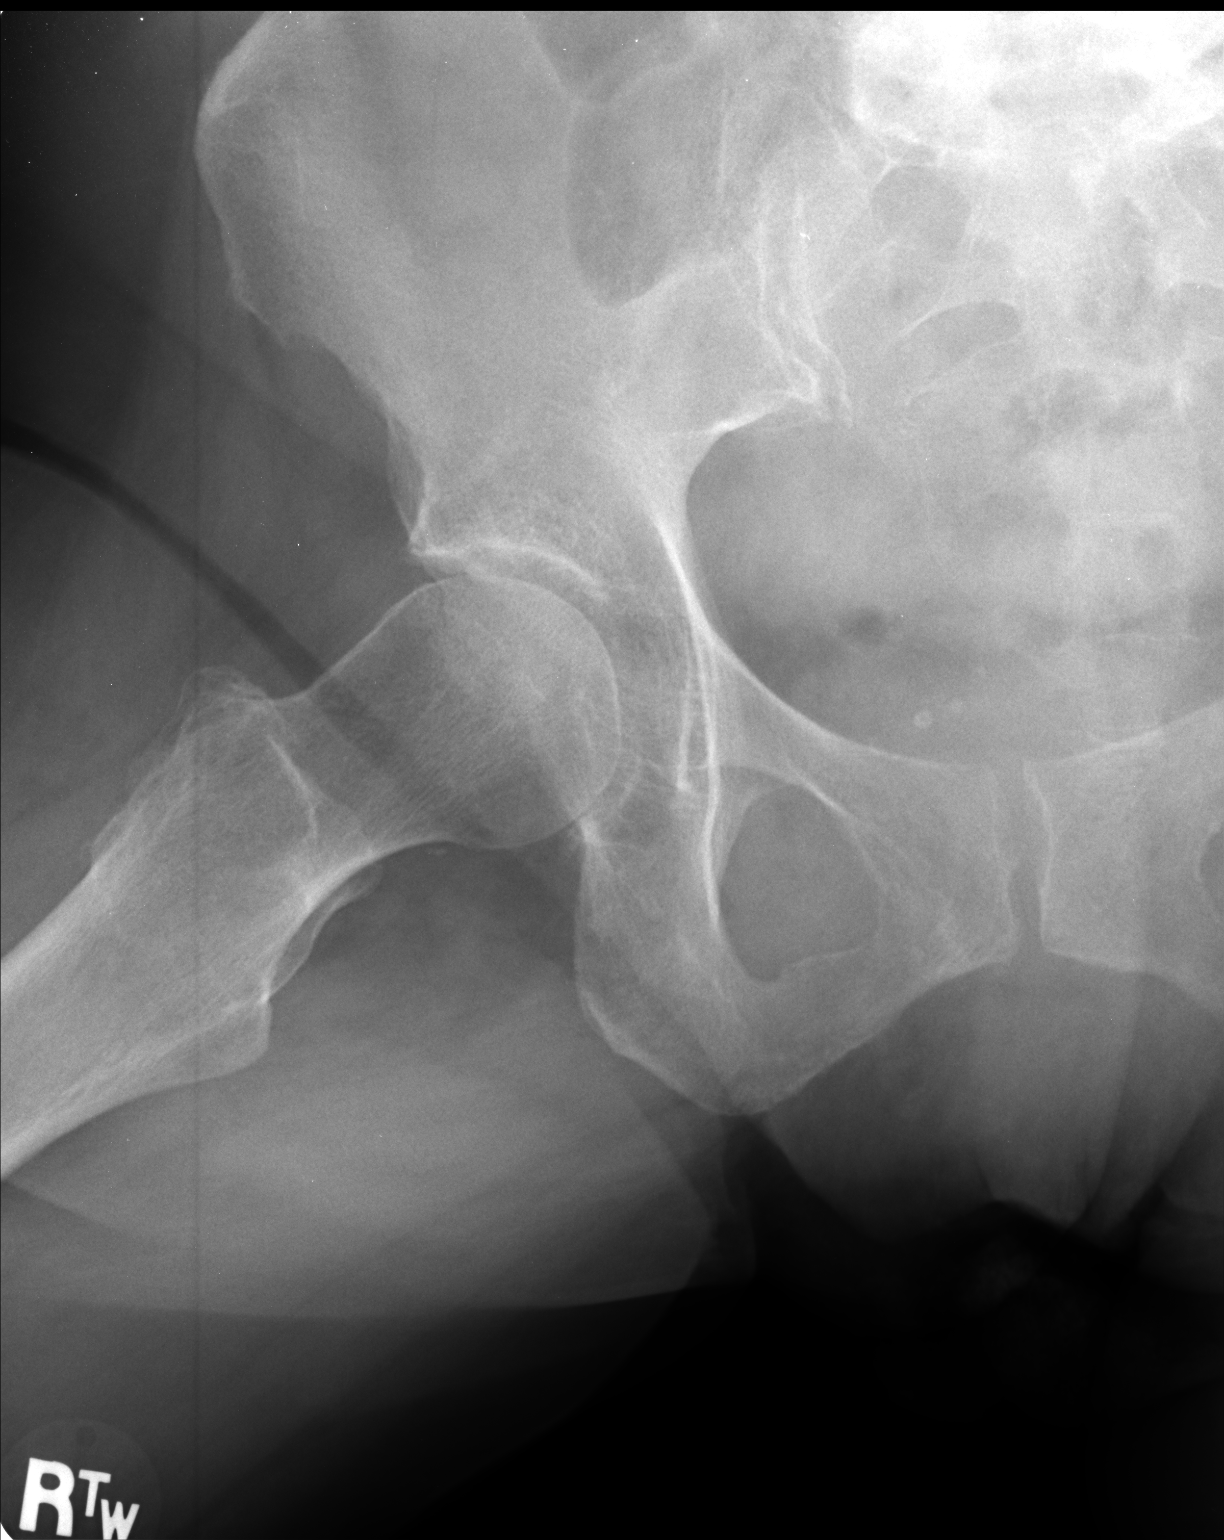

[2 of 2 positions shown; findings below may reference images not displayed]

FINDINGS: The mineralization and alignment are normal. There is no evidence
acute fracture, dislocation or femoral head avascular necrosis. The
hip joint spaces are maintained. Lower lumbar spondylosis and facet
hypertrophy are noted.
IMPRESSION: No acute osseous findings or significant arthropathic changes at the
right hip.

## 2017-05-24 IMAGING — CR DG KNEE COMPLETE 4+V*R*
5 series · 5 of 5 positions shown · non-contrast
Comparison: None.

CLINICAL DATA: Right knee pain

EXAM:
RIGHT KNEE - COMPLETE 4+ VIEW

[AP]
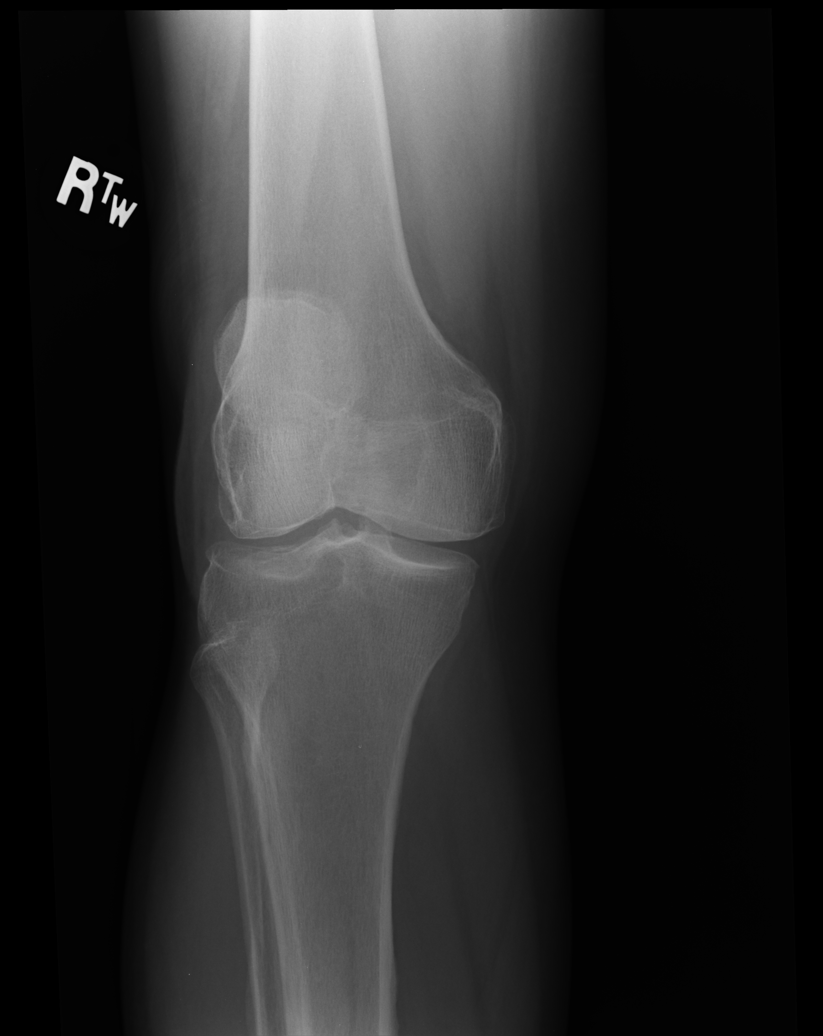

[ap int rot]
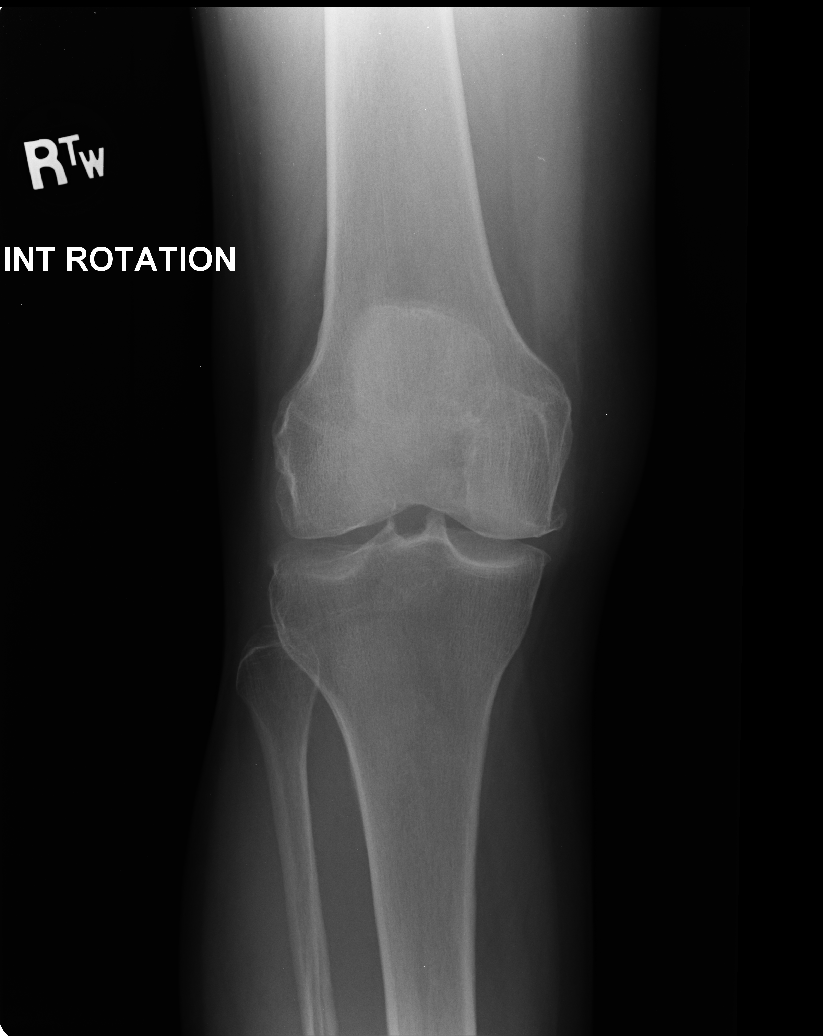

[sunrise]
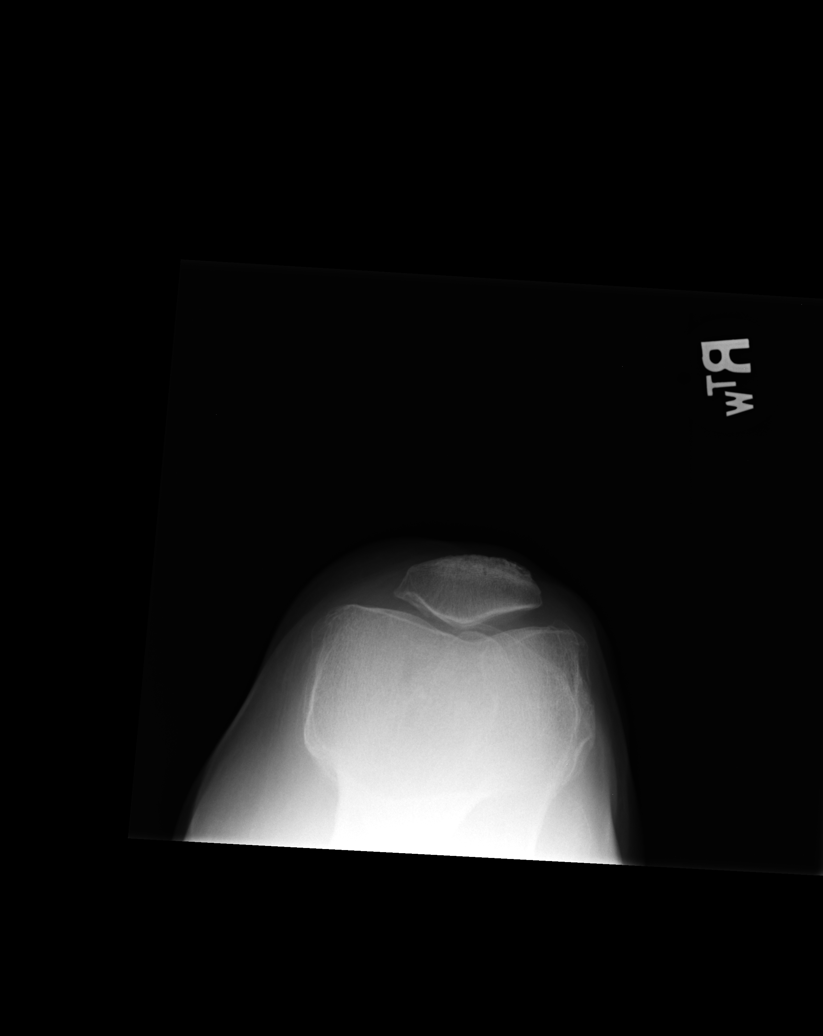

[ap ext rot]
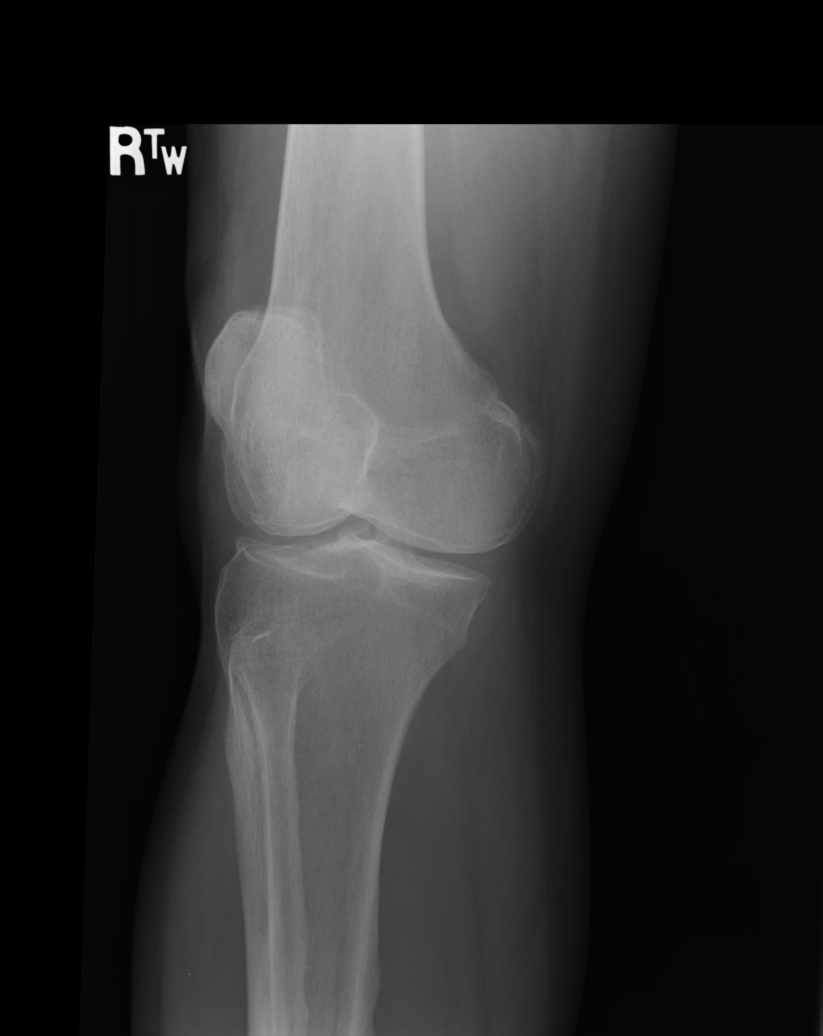

[xtable lateral]
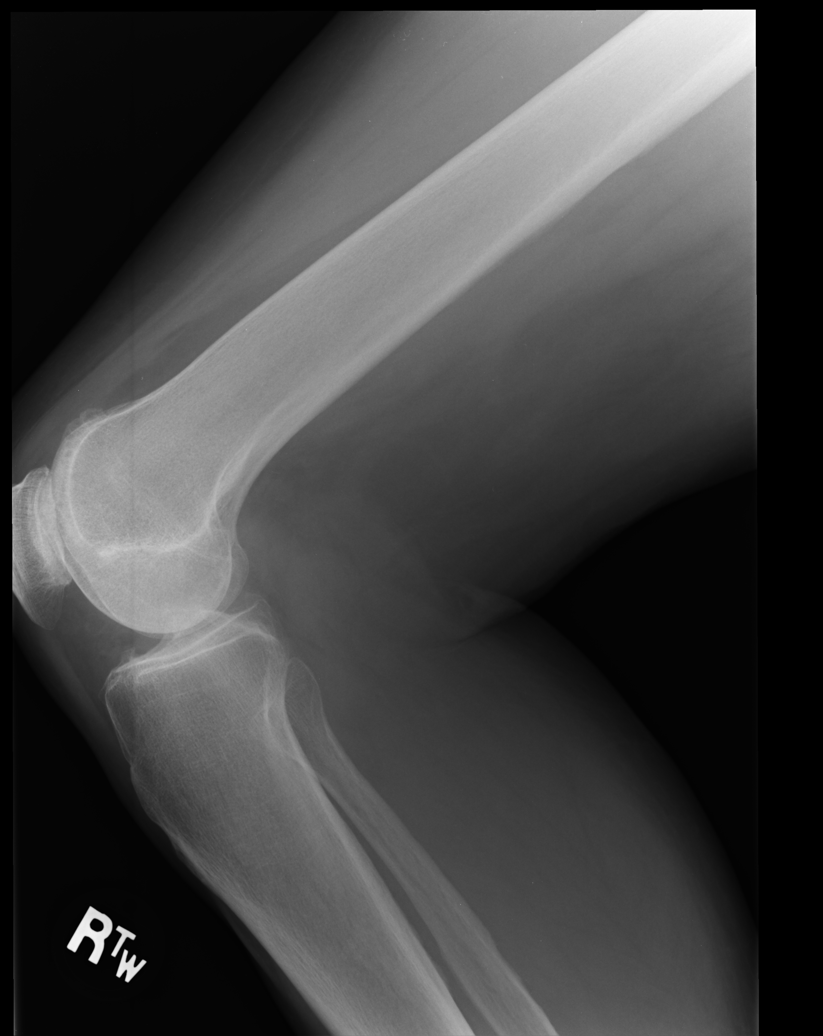

[5 of 5 positions shown; findings below may reference images not displayed]

FINDINGS: Moderate tricompartment osteoarthritic change. Osteopenia. No acute
fracture. No dislocation.
IMPRESSION: No acute bony pathology.

## 2017-09-21 ENCOUNTER — Other Ambulatory Visit: Payer: Self-pay | Admitting: *Deleted

## 2017-09-21 ENCOUNTER — Other Ambulatory Visit: Payer: Self-pay | Admitting: Family Medicine

## 2017-09-21 DIAGNOSIS — R21 Rash and other nonspecific skin eruption: Secondary | ICD-10-CM

## 2018-01-09 ENCOUNTER — Encounter: Payer: Self-pay | Admitting: Obstetrics & Gynecology

## 2018-01-09 ENCOUNTER — Ambulatory Visit: Payer: Self-pay | Admitting: Obstetrics & Gynecology

## 2018-01-09 VITALS — BP 124/80

## 2018-01-09 DIAGNOSIS — N8110 Cystocele, unspecified: Secondary | ICD-10-CM

## 2018-01-09 DIAGNOSIS — R3915 Urgency of urination: Secondary | ICD-10-CM

## 2018-01-09 DIAGNOSIS — N812 Incomplete uterovaginal prolapse: Secondary | ICD-10-CM

## 2018-01-09 DIAGNOSIS — R21 Rash and other nonspecific skin eruption: Secondary | ICD-10-CM

## 2018-01-09 MED ORDER — TOLTERODINE TARTRATE ER 2 MG PO CP24: 2.0000 mg | ORAL_CAPSULE | Freq: Every day | ORAL | 11 refills | Status: DC

## 2018-01-09 NOTE — Progress Notes (Addendum)
Sheila Anderson 1941/03/21 [id number]        76 y.o.  G2P2L2   RP: Bladder or uterus bulging at the vulva for the last month  HPI: Menopause on no HRT.  Has had a chronic vulvar rash improved on Clobetasol.For the last month patient noticed that either the bladder or the uterus is bulging at the vulva.  It is worse when she goes to the bathroom or when she is lifting or coughing.  Patient is active and it is not interfering with her activities at this time.  No pain in the vagina and no postmenopausal bleeding.  Vulvar irritation c/w her chronic dermatitis for which she uses Lidex 0.05%.  Abstinent.  Sometimes patient has to push the bladder back in the vagina with her fingers to pass urine.  Minimal urinary incontinence, but worsened urinary urgency currently.  Urgency previously improved on Detrol LA, needs a represcription.  No problem with bowel movements.   OB History  Gravida Para Term Preterm AB Living  2 2       2   SAB TAB Ectopic Multiple Live Births               # Outcome Date GA Lbr Len/2nd Weight Sex Delivery Anes PTL Lv  2 Para           1 Para             Past medical history,surgical history, problem list, medications, allergies, family history and social history were all reviewed and documented in the EPIC chart.   Directed ROS with pertinent positives and negatives documented in the history of present illness/assessment and plan.  Exam:  Vitals:   01/09/18 1437  BP: 124/80   General appearance:  Normal  Abdomen: Normal  Gynecologic exam: vulva with bilateral erythema.  Laying down with valsalva:  Cystocele grade 2/4, uterine prolapse grade 1/3 and high rectocele grade 1/3.  Uterus normal volume, NT.  No adnexal mass/NT.  Standing with valsalva:  Cystocele grade 4/4 protruding about 2 cm at vulva, uterine prolapse grade 2/3 and small high rectocele grade 1/3.   Assessment/Plan:  77 y.o. G2P2   1. Baden-Walker grade 4 cystocele Cystocele grade 4/4 with  uterine prolapse grade 2/3 and a small high rectocele grade 1/3.  Patient is currently minimally symptomatic.  Counseling done to avoid worsening of pelvic organ prolapse, recommend treating cough and constipation and avoiding heavy lifting.  Also recommend emptying her bladder at least every 3 hours to avoid overfilling.  Kegel exercises instructed.  Will refer patient to physical therapy for pelvic floor reinforcement.  Patient will follow-up for reevaluation if worsening symptoms.  Management with pessary and surgical procedures reviewed.    2. Second degree uterine prolapse As above.  3. Urinary urgency Previously improved urinary urgency symptoms on Detrol LA.  Same medication and dosage represcribed.  Patient instructed to avoid irritants to the bladder such as caffeine products such as coffee, tea and chocolate.  4. Vulvar rash Chronic vulvar dermatitis with exacerbation currently.  Patient will use Lidex cream a thin layer on the affected vulva twice a day for 2 weeks.  Other orders - fluocinonide cream (LIDEX) 0.05 %; Apply 1 application topically 2 (two) times daily. - tolterodine (DETROL LA) 2 MG 24 hr capsule; Take 1 capsule (2 mg total) by mouth daily.  Counseling on above issues and coordination of care more than 50% for 25 minutes.  [name] MD, 3:05 PM 01/09/2018

## 2018-01-09 NOTE — Patient Instructions (Signed)
1. Baden-Walker grade 4 cystocele Cystocele grade 4/4 with uterine prolapse grade 2/3 and a small high rectocele grade 1/3.  Patient is currently minimally symptomatic.  Counseling done to avoid worsening of pelvic organ prolapse, recommend treating cough and constipation and avoiding heavy lifting.  Also recommend emptying her bladder at least every 3 hours to avoid overfilling.  Kegel exercises instructed.  Will refer patient to physical therapy for pelvic floor reinforcement.  Patient will follow-up for reevaluation if worsening symptoms.  Management with pessary and surgical procedures reviewed.    2. Second degree uterine prolapse As above.  3. Urinary urgency Previously improved urinary urgency symptoms on Detrol LA.  Same medication and dosage represcribed.  Patient instructed to avoid irritants to the bladder such as caffeine products such as coffee, tea and chocolate.  4. Vulvar rash Chronic vulvar dermatitis with exacerbation currently.  Patient will use Lidex cream a thin layer on the affected vulva twice a day for 2 weeks.  Other orders - fluocinonide cream (LIDEX) 0.05 %; Apply 1 application topically 2 (two) times daily. - tolterodine (DETROL LA) 2 MG 24 hr capsule; Take 1 capsule (2 mg total) by mouth daily.  Terralyn, it was a pleasure meeting you today!     Kegel Exercises Kegel exercises help strengthen the muscles that support the rectum, vagina, small intestine, bladder, and uterus. Doing Kegel exercises can help:  Improve bladder and bowel control.  Improve sexual response.  Reduce problems and discomfort during pregnancy.  Kegel exercises involve squeezing your pelvic floor muscles, which are the same muscles you squeeze when you try to stop the flow of urine. The exercises can be done while sitting, standing, or lying down, but it is best to vary your position. Phase 1 exercises 1. Squeeze your pelvic floor muscles tight. You should feel a tight lift in your rectal  area. If you are a female, you should also feel a tightness in your vaginal area. Keep your stomach, buttocks, and legs relaxed. 2. Hold the muscles tight for up to 10 seconds. 3. Relax your muscles. Repeat this exercise 50 times a day or as many times as told by your health care provider. Continue to do this exercise for at least 4-6 weeks or for as long as told by your health care provider. This information is not intended to replace advice given to you by your health care provider. Make sure you discuss any questions you have with your health care provider. Document Released: 05/15/2012 Document Revised: 01/22/2016 Document Reviewed: 04/18/2015 Elsevier Interactive Patient Education  [employer].

## 2018-01-11 ENCOUNTER — Telehealth: Payer: Self-pay | Admitting: *Deleted

## 2018-01-11 ENCOUNTER — Encounter: Payer: Self-pay | Admitting: *Deleted

## 2018-01-11 DIAGNOSIS — N8189 Other female genital prolapse: Secondary | ICD-10-CM

## 2018-01-11 NOTE — Telephone Encounter (Signed)
Referral placed at [facility] they will call to schedule.  °

## 2018-01-11 NOTE — Telephone Encounter (Signed)
-----   Message from [name], MD sent at 01/09/2018  3:05 PM EDT ----- Regarding: Refer to Physical Therapy Pelvic floor reinforcement.  Cystocele 4/4 and Uterine prolapse 2/3.

## 2018-01-23 NOTE — Telephone Encounter (Signed)
Patient scheduled on 01/24/18 @ 11:45am

## 2018-01-24 ENCOUNTER — Ambulatory Visit: Payer: Medicare Other | Attending: Obstetrics & Gynecology | Admitting: Physical Therapy

## 2018-01-24 ENCOUNTER — Other Ambulatory Visit: Payer: Self-pay

## 2018-01-24 DIAGNOSIS — R279 Unspecified lack of coordination: Secondary | ICD-10-CM | POA: Diagnosis present

## 2018-01-24 DIAGNOSIS — M6281 Muscle weakness (generalized): Secondary | ICD-10-CM | POA: Diagnosis present

## 2018-01-24 NOTE — Patient Instructions (Addendum)
Toileting Techniques for Bowel Movements (Defecation) Using your belly (abdomen) and pelvic floor muscles to have a bowel movement is usually instinctive.  Sometimes people can have problems with these muscles and have to relearn proper defecation (emptying) techniques.  If you have weakness in your muscles, organs that are falling out, decreased sensation in your pelvis, or ignore your urge to go, you may find yourself straining to have a bowel movement.  You are straining if you are: . holding your breath or taking in a huge gulp of air and holding it  . keeping your lips and jaw tensed and closed tightly . turning red in the face because of excessive pushing or forcing . developing or worsening your  hemorrhoids . getting faint while pushing . not emptying completely and have to defecate many times a day  If you are straining, you are actually making it harder for yourself to have a bowel movement.  Many people find they are pulling up with the pelvic floor muscles and closing off instead of opening the anus. Due to lack pelvic floor relaxation and coordination the abdominal muscles, one has to work harder to push the feces out.  Many people have never been taught how to defecate efficiently and effectively.  Notice what happens to your body when you are having a bowel movement.  While you are sitting on the toilet pay attention to the following areas: . Jaw and mouth position . Angle of your hips   . Whether your feet touch the ground or not . Arm placement  . Spine position . Waist . Belly tension . Anus (opening of the anal canal)  An Evacuation/Defecation Plan   Here are the 4 basic points:  1. Lean forward enough for your elbows to rest on your knees 2. Support your feet on the floor or use a low stool if your feet don't touch the floor  3. Push out your belly as if you have swallowed a beach ball-you should feel a widening of your waist 4. Open and relax your pelvic floor muscles,  rather than tightening around the anus      The following conditions my require modifications to your toileting posture:  . If you have had surgery in the past that limits your back, hip, pelvic, knee or ankle flexibility . Constipation   Your healthcare practitioner may make the following additional suggestions and adjustments:  1) Sit on the toilet  a) Make sure your feet are supported. b) Notice your hip angle and spine position-most people find it effective to lean forward or raise their knees, which can help the muscles around the anus to relax  c) When you lean forward, place your forearms on your thighs for support  2) Relax suggestions a) Breath deeply in through your nose and out slowly through your mouth as if you are smelling the flowers and blowing out the candles. b) To become aware of how to relax your muscles, contracting and releasing muscles can be helpful.  Pull your pelvic floor muscles in tightly by using the image of holding back gas, or closing around the anus (visualize making a circle smaller) and lifting the anus up and in.  Then release the muscles and your anus should drop down and feel open. Repeat 5 times ending with the feeling of relaxation. c) Keep your pelvic floor muscles relaxed; let your belly bulge out. d) The digestive tract starts at the mouth and ends at the anal opening, so be   sure to relax both ends of the tube.  Place your tongue on the roof of your mouth with your teeth separated.  This helps relax your mouth and will help to relax the anus at the same time.  3) Empty (defecation) a) Keep your pelvic floor and sphincter relaxed, then bulge your anal muscles.  Make the anal opening wide.  b) Stick your belly out as if you have swallowed a beach ball. c) Make your belly wall hard using your belly muscles while continuing to breathe. Doing this makes it easier to open your anus. d) Breath out and give a grunt (or try using other sounds such as  ahhhh, shhhhh, ohhhh or grrrrrrr).  4) Finish a) As you finish your bowel movement, pull the pelvic floor muscles up and in.  This will leave your anus in the proper place rather than remaining pushed out and down. If you leave your anus pushed out and down, it will start to feel as though that is normal and give you incorrect signals about needing to have a bowel movement.     [facility] [address], [place] [address] Access Code: [email or web address]  URL: [email or web address]  Date: 01/24/2018  Prepared by: Lovett Calender   Exercises  Ball squeeze with Kegel - 10 reps - 1 sets - 1 sec hold - 3x daily - 7x weekly  Seated Pelvic Floor Contraction with Isometric Hip Adduction - 10 reps - 1 sets - 1 sec hold - 1x daily - 7x weekly

## 2018-01-24 NOTE — Therapy (Signed)
[facility] [address], [place], [place], [address] Phone: [phone]   Fax:  [phone]  Physical Therapy Evaluation  Patient Details  Name: Sheila Anderson MRN: [id number] Date of Birth: 1941-03-24 Referring Provider: [name]   Encounter Date: 01/24/2018  PT End of Session - 01/24/18 1339    Visit Number  1    Date for PT Re-Evaluation  03/21/18    Authorization Type  [employer] medicare    PT Start Time  1147    PT Stop Time  1230    PT Time Calculation (min)  43 min    Activity Tolerance  Patient tolerated treatment well       Past Medical History:  Diagnosis Date  . Allergy   . Anxiety   . Anxiety   . Arthritis   . Back pain   . Cataract   . Closed L1 vertebral fracture ([place])   . GERD (gastroesophageal reflux disease)   . History of hiatal hernia   . Neck pain   . Neuromuscular disorder ([place])   . Thyroid lesion     Past Surgical History:  Procedure Laterality Date  . CATARACT EXTRACTION Left    10 yrs ago  . cyst removal from bladder    . EYE SURGERY Left   . laproscopy     poly in colon  . REVERSE SHOULDER ARTHROPLASTY Right 03/10/2016   Procedure: RIGHT REVERSE SHOULDER ARTHROPLASTY;  Surgeon: Netta Cedars, MD;  Location: [place];  Service: Orthopedics;  Laterality: Right;  . wrinkle in retina Left     There were no vitals filed for this visit.   Subjective Assessment - 01/24/18 1152    Subjective  Pt states she has began to feel and see things falling in the vagina.  She can push it back up and it can stay up for the day.  States it began about a month ago.  States she has had a cough for years and feels that may have caused the issue.  She feels prolapse when coughing and walking a lot.    Patient Stated Goals  Feel the falling less frequent    Currently in Pain?  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      Cognition   Overall Cognitive Status  Within Functional Limits for tasks assessed      Posture/Postural Control   Posture/Postural Control  Postural limitations    Postural Limitations  Flexed trunk      ROM / Strength   AROM / PROM / Strength  AROM;Strength      AROM  Overall AROM Comments  lumbar flexion and ext 30% limited      Strength   Right Hip Flexion  4/5    Right Hip Extension  4/5    Right Hip External Rotation   4/5    Right Hip Internal Rotation  4+/5    Right Hip ABduction  4/5    Right Hip ADduction  4/5    Left Hip Flexion  3+/5    Left Hip Extension  4-/5    Left Hip External Rotation  3+/5    Left Hip Internal Rotation  4/5    Left Hip ABduction  4-/5    Left Hip ADduction  3+/5      Palpation   Palpation comment  tight IT band and TFL on Rt side      Ambulation/Gait   Gait Pattern  Decreased stride length;Trunk flexed                Objective measurements completed on examination: See above findings.    Pelvic Floor Special Questions - 01/24/18 0001    Prior Pelvic/Prostate Exam  Yes    Are you Pregnant or attempting pregnancy?  No    Prior Pregnancies  Yes   laproscopy, bladder cyst removed   Number of Pregnancies  2    Number of Vaginal Deliveries  2    Any difficulty with labor and deliveries  No    Currently Sexually Active  Yes    Is this Painful  No    Urinary Leakage   Yes    How often  sometimes every 30 min    Pad use  always discrete    Activities that cause leaking  Walking;With strong urge    Urinary urgency  Yes    Fecal incontinence  No    Fluid intake  drink a lot     Caffeine beverages  No    Falling out feeling (prolapse)  Yes    Activities that cause feeling of prolapse  coughing    Skin Integrity  Intact;Hemorroids    Prolapse  Anterior Wall;Uterine    Pelvic Floor Internal Exam  pt informed and consent given to perform internal soft tissue assessment    Exam Type  Vaginal    Sensation  normal    Palpation  low tone, OI tight on Rt side    Strength  weak squeeze, no lift    Strength # of reps  1    Strength # of seconds  1    Tone  low       [facility] Adult PT Treatment/Exercise - 01/24/18 0001      Self-Care   Self-Care  Other Self-Care Comments    Other Self-Care Comments   toileting techniques      Exercises   Exercises  Other Exercises    Other Exercises   educated and perform initial HEP             PT Education - 01/24/18 1349    Education Details  toilet techniques,  Access Code: [email or web address]     Person(s) Educated  Patient    Methods  Explanation;Demonstration;Verbal cues;Handout    Comprehension  Verbalized understanding;Returned demonstration       PT Short Term Goals - 01/24/18 1352      PT SHORT TERM GOAL #1   Title  Pt will be independent in intial HEP    Time  4    Period  Weeks    Status  New    Target Date  02/21/18      PT SHORT TERM GOAL #2   Title  pt will report 50% less bearing down to have BM in order to put less stress on pelvic organs    Time  4    Period  Weeks    Status  New    Target Date  02/21/18        PT Long Term Goals - 01/24/18 1353      PT LONG TERM GOAL #1   Title  Pt will be able to sustain contraction for 10 sec in order to reduce prolapse when cougth    Time  8    Period  Weeks    Status  New    Target Date  03/21/18      PT LONG TERM GOAL #2   Title  Pt will  report 30% reduction in leakage    Time  8    Period  Weeks    Status  New    Target Date  03/21/18      PT LONG TERM GOAL #3   Title  Pt will report 30% reduction in feeling of prolapse due to improved strength    Time  8    Period  Weeks    Status  New    Target Date  03/21/18      PT LONG TERM GOAL #4   Title  ind with advanced HEP    Time  8    Period  Weeks    Status  New    Target Date  03/21/18             Plan - 01/24/18 1342    Clinical Impression Statement  Pt presents to skilled PT due to pelvic organ prolapse of bladder and uterine.  Pt has 2/5 MMT with no endurance, less than 1 sec hold.  Pt demonstrates full bladder prolapse in supine when coughing.  She has tight obdurator internus on Rt LE and overall low tone of pelvic floor.  Pt has bilateral hip weakness.  She has decreased ROM with lumbar flexion and extension.  Pt will benefit from skilled PT to address impairments and reduce symptoms of prolapse    History and Personal Factors relevant to plan of care:  persistent cough for 2+years,     Clinical Presentation  Evolving    Clinical Presentation due to:  pt's symptoms have been worsening over the last couple of months    Clinical Decision Making  Low    Rehab Potential  Fair    Clinical Impairments Affecting Rehab Potential  chronicity of problem    PT Frequency  2x / week   reducing to 1x as able   PT Duration  8 weeks    PT Treatment/Interventions  ADLs/Self Care Home Management;Biofeedback;Cryotherapy;Electrical Stimulation;Moist Heat;Therapeutic exercise;Therapeutic activities;Gait training;Neuromuscular re-education;Patient/family education;Manual techniques;Passive range of motion;Dry needling;Taping    PT Next Visit Plan  biofeeback and knack; progress strength as able    PT Home Exercise Plan   Access Code: [email or web address]     Recommended Other Services  eval 01/24/18    Consulted and Agree with Plan of Care  Patient       Patient will benefit from  skilled therapeutic intervention in order to improve the following deficits and impairments:  Impaired tone, Increased muscle spasms, Decreased strength, Decreased coordination, Postural dysfunction  Visit Diagnosis: Muscle weakness (generalized)  Unspecified lack of coordination  Problem List Patient Active Problem List   Diagnosis Date Noted  . S/P shoulder replacement 03/10/2016  . Lumbosacral radiculopathy 02/09/2016  . Left shoulder pain 01/12/2015    Zannie Cove, PT 01/24/2018, 2:08 PM  [facility] [address] [place], [place], [address] Phone: [phone]   Fax:  [phone]  Name: Sheila Anderson MRN: [id number] Date of Birth: September 19, 1940

## 2018-01-29 ENCOUNTER — Ambulatory Visit: Payer: Medicare Other | Admitting: Physical Therapy

## 2018-01-29 ENCOUNTER — Encounter: Payer: Self-pay | Admitting: Physical Therapy

## 2018-01-29 DIAGNOSIS — R279 Unspecified lack of coordination: Secondary | ICD-10-CM

## 2018-01-29 DIAGNOSIS — M6281 Muscle weakness (generalized): Secondary | ICD-10-CM

## 2018-01-29 NOTE — Patient Instructions (Signed)
Access Code: [email or web address]

## 2018-01-29 NOTE — Therapy (Signed)
[facility] [address] [place], [place], [address] Phone: [phone]   Fax:  [phone]  Physical Therapy Treatment  Patient Details  Name: Sheila Anderson MRN: [id number] Date of Birth: [birth year] Referring Provider: [name]   Encounter Date: 01/29/2018  PT End of Session - 01/29/18 0758    Visit Number  2    Date for PT Re-Evaluation  03/21/18    Authorization Type  [employer] medicare    PT Start Time  0758    PT Stop Time  0838    PT Time Calculation (min)  40 min    Activity Tolerance  Patient tolerated treatment well       Past Medical History:  Diagnosis Date  . Allergy   . Anxiety   . Anxiety   . Arthritis   . Back pain   . Cataract   . Closed L1 vertebral fracture ([place])   . GERD (gastroesophageal reflux disease)   . History of hiatal hernia   . Neck pain   . Neuromuscular disorder ([place])   . Thyroid lesion     Past Surgical History:  Procedure Laterality Date  . CATARACT EXTRACTION Left    10 yrs ago  . cyst removal from bladder    . EYE SURGERY Left   . laproscopy     poly in colon  . REVERSE SHOULDER ARTHROPLASTY Right 03/10/2016   Procedure: RIGHT REVERSE SHOULDER ARTHROPLASTY;  Surgeon: Netta Cedars, MD;  Location: [place];  Service: Orthopedics;  Laterality: Right;  . wrinkle in retina Left     There were no vitals filed for this visit.  Subjective Assessment - 01/29/18 0803    Subjective  Pt states she didn't use the ball but has been doing the kegel exercises and it feels like it is getting a little better.    Patient Stated Goals  Feel the falling less frequent    Currently in Pain?  No/denies                       [facility] Adult PT Treatment/Exercise - 01/29/18 0001      Neuro Re-ed    Neuro Re-ed Details   resting 6.62mV; quick contract 4.25; contract and hold 10 and 20 seconds at 3.64mV; rest 3.44mV; diaphragm breathing      Exercises   Exercises  Lumbar       Lumbar Exercises: Seated   Other Seated Lumbar Exercises  seated kegel with towel squeeze      Lumbar Exercises: Supine   Other Supine Lumbar Exercises  kegel with ball squeeze - 2 sec hold, 3 sec rest             PT Education - 01/29/18 1013    Education Details   Access Code: [email or web address]     Person(s) Educated  Patient    Methods  Explanation;Demonstration;Handout    Comprehension  Verbalized understanding;Returned demonstration       PT Short Term Goals - 01/24/18 1352      PT SHORT TERM GOAL #1   Title  Pt will be independent in intial HEP    Time  4    Period  Weeks    Status  New    Target Date  02/21/18      PT SHORT TERM GOAL #2   Title  pt will report 50% less bearing down to have BM in order to put less stress on pelvic organs  Time  4    Period  Weeks    Status  New    Target Date  02/21/18        PT Long Term Goals - 01/24/18 1353      PT LONG TERM GOAL #1   Title  Pt will be able to sustain contraction for 10 sec in order to reduce prolapse when cougth    Time  8    Period  Weeks    Status  New    Target Date  03/21/18      PT LONG TERM GOAL #2   Title  Pt will report 30% reduction in leakage    Time  8    Period  Weeks    Status  New    Target Date  03/21/18      PT LONG TERM GOAL #3   Title  Pt will report 30% reduction in feeling of prolapse due to improved strength    Time  8    Period  Weeks    Status  New    Target Date  03/21/18      PT LONG TERM GOAL #4   Title  ind with advanced HEP    Time  8    Period  Weeks    Status  New    Target Date  03/21/18            Plan - 01/29/18 0846    Clinical Impression Statement  Pt demonstrates good control of her contraction and able to do a consistent small contraction.  She has more muscle activity with accessory muscle using the towel to squeeze and able to hold longer up to 2 seconds.  Pt needed cues initially to relax but resting tone was normal.  She will continue to  benefit from skilled PT to work on strength and endurance.  Pt was educated further in looking into a pessary.    PT Treatment/Interventions  ADLs/Self Care Home Management;Biofeedback;Cryotherapy;Electrical Stimulation;Moist Heat;Therapeutic exercise;Therapeutic activities;Gait training;Neuromuscular re-education;Patient/family education;Manual techniques;Passive range of motion;Dry needling;Taping    PT Next Visit Plan  posture and progress pelvic floor strength and endurance as able    PT Home Exercise Plan   Access Code: [email or web address]     Consulted and Agree with Plan of Care  Patient       Patient will benefit from skilled therapeutic intervention in order to improve the following deficits and impairments:  Impaired tone, Increased muscle spasms, Decreased strength, Decreased coordination, Postural dysfunction  Visit Diagnosis: Muscle weakness (generalized)  Unspecified lack of coordination     Problem List Patient Active Problem List   Diagnosis Date Noted  . S/P shoulder replacement 03/10/2016  . Lumbosacral radiculopathy 02/09/2016  . Left shoulder pain 01/12/2015    Zannie Cove, PT 01/29/2018, 10:13 AM  [facility] [address], [place], [place], [address] Phone: [phone]   Fax:  [phone]  Name: Sheila Anderson MRN: [id number] Date of Birth: 1941/05/05

## 2018-02-01 ENCOUNTER — Encounter: Payer: Self-pay | Admitting: Physical Therapy

## 2018-02-04 ENCOUNTER — Ambulatory Visit: Payer: Medicare Other | Admitting: Physical Therapy

## 2018-02-04 ENCOUNTER — Encounter: Payer: Self-pay | Admitting: Physical Therapy

## 2018-02-04 DIAGNOSIS — M6281 Muscle weakness (generalized): Secondary | ICD-10-CM | POA: Diagnosis not present

## 2018-02-04 DIAGNOSIS — R279 Unspecified lack of coordination: Secondary | ICD-10-CM

## 2018-02-04 NOTE — Therapy (Signed)
[facility] [address], [place], [place], [address] Phone: [phone]   Fax:  [phone]  Physical Therapy Treatment  Patient Details  Name: Sheila Anderson MRN: [id number] Date of Birth: [birth year] Referring Provider: [name]   Encounter Date: 02/04/2018  PT End of Session - 02/04/18 0853    Visit Number  3    Date for PT Re-Evaluation  03/21/18    Authorization Type  [employer] medicare    PT Start Time  [phone]   late   PT Stop Time  0931    PT Time Calculation (min)  38 min    Activity Tolerance  Patient tolerated treatment well       Past Medical History:  Diagnosis Date  . Allergy   . Anxiety   . Anxiety   . Arthritis   . Back pain   . Cataract   . Closed L1 vertebral fracture ([place])   . GERD (gastroesophageal reflux disease)   . History of hiatal hernia   . Neck pain   . Neuromuscular disorder ([place])   . Thyroid lesion     Past Surgical History:  Procedure Laterality Date  . CATARACT EXTRACTION Left    10 yrs ago  . cyst removal from bladder    . EYE SURGERY Left   . laproscopy     poly in colon  . REVERSE SHOULDER ARTHROPLASTY Right 03/10/2016   Procedure: RIGHT REVERSE SHOULDER ARTHROPLASTY;  Surgeon: Netta Cedars, MD;  Location: [place];  Service: Orthopedics;  Laterality: Right;  . wrinkle in retina Left     There were no vitals filed for this visit.  Subjective Assessment - 02/04/18 0854    Subjective  Pt had some back pain since previous visit but reports it was from doubling up on her exercises and has since been better.  She reports no changes with the bulging and prolapse at this time.  Pt continues to have chronic cough.    Patient Stated Goals  Feel the falling less frequent    Currently in Pain?  No/denies                       [facility] Adult PT Treatment/Exercise - 02/04/18 0001      Exercises   Exercises  Lumbar    Other Exercises   educated and perform  initial HEP      Lumbar Exercises: Stretches   Active Hamstring Stretch  Right;Left;1 rep;30 seconds    Single Knee to Chest Stretch  Right;Left;2 reps;20 seconds    Piriformis Stretch  Right;Left;2 reps;20 seconds      Lumbar Exercises: Seated   Other Seated Lumbar Exercises  clam with yellow, marching - 10x each side with kegel and 2 sec hold      Lumbar Exercises: Supine   Bent Knee Raise  10 reps;2 seconds   with kegel   Bridge  5 reps;2 seconds   mini bridge with kegel              PT Short Term Goals - 02/04/18 0941      PT SHORT TERM GOAL #1   Title  Pt will be independent in intial HEP    Status  Achieved      PT SHORT TERM GOAL #2   Title  pt will report 50% less bearing down to have BM in order to put less stress on pelvic organs    Baseline  reports  she did a kegel and BM came out easily with that    Status  Partially Met      PT SHORT TERM GOAL #3   Title  ...        PT Long Term Goals - 01/24/18 1353      PT LONG TERM GOAL #1   Title  Pt will be able to sustain contraction for 10 sec in order to reduce prolapse when cougth    Time  8    Period  Weeks    Status  New    Target Date  03/21/18      PT LONG TERM GOAL #2   Title  Pt will report 30% reduction in leakage    Time  8    Period  Weeks    Status  New    Target Date  03/21/18      PT LONG TERM GOAL #3   Title  Pt will report 30% reduction in feeling of prolapse due to improved strength    Time  8    Period  Weeks    Status  New    Target Date  03/21/18      PT LONG TERM GOAL #4   Title  ind with advanced HEP    Time  8    Period  Weeks    Status  New    Target Date  03/21/18            Plan - 02/04/18 1610    Clinical Impression Statement  Pt did well with exercises today.  She was able to perform exercises correctly without back pain or increased use of lumbar erectors.  Pt needed cues to exhale with exertion and she is unable to hold more than 2 seconds.  Pt needed  breaks when coughing which continues to persist. Pt will take the next few weeks to work on HEP so she can progress with PT at next visit.  Pt will benefit from skilled PT to progress strength and endurnace    PT Treatment/Interventions  ADLs/Self Care Home Management;Biofeedback;Cryotherapy;Electrical Stimulation;Moist Heat;Therapeutic exercise;Therapeutic activities;Gait training;Neuromuscular re-education;Patient/family education;Manual techniques;Passive range of motion;Dry needling;Taping    PT Next Visit Plan  biofeedback to re-assess pelvic floor strength and endurance and progress as able    PT Home Exercise Plan   Access Code: [email or web address]     Consulted and Agree with Plan of Care  Patient       Patient will benefit from skilled therapeutic intervention in order to improve the following deficits and impairments:  Impaired tone, Increased muscle spasms, Decreased strength, Decreased coordination, Postural dysfunction  Visit Diagnosis: Muscle weakness (generalized)  Unspecified lack of coordination     Problem List Patient Active Problem List   Diagnosis Date Noted  . S/P shoulder replacement 03/10/2016  . Lumbosacral radiculopathy 02/09/2016  . Left shoulder pain 01/12/2015    Zannie Cove, PT 02/04/2018, 9:45 AM  [facility] [address] [place], [place], [address] Phone: [phone]   Fax:  [phone]  Name: Sheila Anderson MRN: [id number] Date of Birth: January 24, 1941

## 2018-02-06 ENCOUNTER — Encounter: Payer: Self-pay | Admitting: Physical Therapy

## 2018-03-01 ENCOUNTER — Ambulatory Visit: Payer: Medicare Other | Attending: Obstetrics & Gynecology | Admitting: Physical Therapy

## 2018-03-01 ENCOUNTER — Encounter: Payer: Self-pay | Admitting: Physical Therapy

## 2018-03-01 DIAGNOSIS — M6281 Muscle weakness (generalized): Secondary | ICD-10-CM | POA: Diagnosis present

## 2018-03-01 DIAGNOSIS — R279 Unspecified lack of coordination: Secondary | ICD-10-CM | POA: Insufficient documentation

## 2018-03-01 NOTE — Patient Instructions (Signed)
Access Code: [email or web address]  URL: [email or web address]  Date: 03/01/2018  Prepared by: Lovett Calender   Exercises  Ball squeeze with Kegel - 10 reps - 1 sets - 10 sec hold - 1x daily - 7x weekly  Supine Bridge with Pelvic Floor Contraction - 10 reps - 1 sets - 10 sec hold - 1x daily - 7x weekly  Seated Pelvic Floor Contraction with Isometric Hip Adduction - 10 reps - 1 sets - 2 sec hold - 1x daily - 7x weekly  Mini Squat with Pelvic Floor Contraction - 5 reps - 1 sets - 2 sec hold - 1x daily - 7x weekly  Seated Piriformis Stretch with Trunk Bend - 3 reps - 1 sets - 30 sec hold - 1x daily - 7x weekly  Hooklying Single Knee to Chest Stretch - 3 reps - 1 sets - 20 sec hold - 1x daily - 7x weekly  Seated Hamstring Stretch - 3 reps - 1 sets - 20 sec hold - 1x daily - 7x weekly

## 2018-03-01 NOTE — Therapy (Signed)
[facility] [address], [address] [place], [place], [address] Phone: [phone]   Fax:  [phone]  Physical Therapy Treatment  Patient Details  Name: Sheila Anderson MRN: [id number] Date of Birth: 05/07/1941 Referring Provider: LAVOIE, MARIE-LYNE   Encounter Date: 03/01/2018  PT End of Session - 03/01/18 1102    Visit Number  4    Date for PT Re-Evaluation  03/21/18    Authorization Type  [employer] medicare    PT Start Time  1101    PT Stop Time  1145    PT Time Calculation (min)  44 min    Activity Tolerance  Patient tolerated treatment well       Past Medical History:  Diagnosis Date  . Allergy   . Anxiety   . Anxiety   . Arthritis   . Back pain   . Cataract   . Closed L1 vertebral fracture (HCC)   . GERD (gastroesophageal reflux disease)   . History of hiatal hernia   . Neck pain   . Neuromuscular disorder (HCC)   . Thyroid lesion     Past Surgical History:  Procedure Laterality Date  . CATARACT EXTRACTION Left    10 yrs ago  . cyst removal from bladder    . EYE SURGERY Left   . laproscopy     poly in colon  . REVERSE SHOULDER ARTHROPLASTY Right 03/10/2016   Procedure: RIGHT REVERSE SHOULDER ARTHROPLASTY;  Surgeon: Steve Norris, MD;  Location: [facility];  Service: Orthopedics;  Laterality: Right;  . wrinkle in retina Left     There were no vitals filed for this visit.  Subjective Assessment - 03/01/18 1103    Subjective  It seems like I am having to go more frequently over the last 2 weeks.  I have been able to do the exercises.  I have been more busy going to schools for concerts.      Patient Stated Goals  Feel the falling less frequent    Currently in Pain?  No/denies                       [facility] Adult PT Treatment/Exercise - 03/01/18 0001      Neuro Re-ed    Neuro Re-ed Details   contract and relax with biofeedback      Exercises   Exercises  Lumbar      Lumbar Exercises: Standing    Functional Squats  10 reps   mini squat with kegel (felt bladder fall out)     Lumbar Exercises: Seated   Other Seated Lumbar Exercises  clam with yellow, marching - 10x each side with kegel and 2 sec hold    Other Seated Lumbar Exercises  ball squeeze with kegel x2 sec hold x 10      Lumbar Exercises: Supine   Bent Knee Raise  10 reps;5 seconds   with kegel barely to 9mV; improved with cues   Bridge  5 reps;5 seconds;Limitations   5 reps x 10 sec hold   Bridge Limitations  ball squeeze with kegel and 5 sec rest in between - holding >9mV    Other Supine Lumbar Exercises  ball squeeze with kegel 2 sec x 2 sec relax      Lumbar Exercises: Sidelying   Clam  10 reps;Right;Left;5 seconds   kegel            PT Education - 03/01/18 1200    Education Details     Access Code: [email or web address]     Person(s) Educated  Patient    Methods  Explanation;Demonstration;Verbal cues;Handout    Comprehension  Verbalized understanding;Returned demonstration       PT Short Term Goals - 03/01/18 1203      PT SHORT TERM GOAL #2   Title  pt will report 50% less bearing down to have BM in order to put less stress on pelvic organs    Status  Partially Met        PT Long Term Goals - 03/01/18 1203      PT LONG TERM GOAL #1   Title  Pt will be able to sustain contraction for 10 sec in order to reduce prolapse when cougth    Status  On-going      PT LONG TERM GOAL #2   Title  Pt will report 30% reduction in leakage    Status  On-going      PT LONG TERM GOAL #3   Title  Pt will report 30% reduction in feeling of prolapse due to improved strength    Status  On-going      PT LONG TERM GOAL #4   Title  ind with advanced HEP    Status  On-going            Plan - 03/01/18 1205    Clinical Impression Statement  Pt was able to sustain contraction for 10 seconds with supine positions.  She has much greater difficutly in sitting and standing.  Pt in supine is able to contract to 15-20mV,  sitting barely to 5, standing 10-11mV.  She is making progress but needed education on fatigue management due to increased bulging of bladder throughout the day.  Pt will benefit from skilled PT to porgress strength for management of symptoms.    PT Treatment/Interventions  ADLs/Self Care Home Management;Biofeedback;Cryotherapy;Electrical Stimulation;Moist Heat;Therapeutic exercise;Therapeutic activities;Gait training;Neuromuscular re-education;Patient/family education;Manual techniques;Passive range of motion;Dry needling;Taping    PT Next Visit Plan  biofeedback to progress pelvic floor strength and endurance and progress as able, attempt sitting and standing exercises    PT Home Exercise Plan   Access Code: [email or web address]     Consulted and Agree with Plan of Care  Patient       Patient will benefit from skilled therapeutic intervention in order to improve the following deficits and impairments:  Impaired tone, Increased muscle spasms, Decreased strength, Decreased coordination, Postural dysfunction  Visit Diagnosis: Muscle weakness (generalized)  Unspecified lack of coordination     Problem List Patient Active Problem List   Diagnosis Date Noted  . S/P shoulder replacement 03/10/2016  . Lumbosacral radiculopathy 02/09/2016  . Left shoulder pain 01/12/2015    Jakki Crosser, PT 03/01/2018, 12:08 PM  [facility] [address], [address] [place], [place], [address] Phone: [phone]   Fax:  [phone]  Name: Sheila Anderson MRN: [id number] Date of Birth: 03/10/1941   

## 2018-03-13 ENCOUNTER — Ambulatory Visit: Payer: Medicare Other | Attending: Obstetrics & Gynecology | Admitting: Physical Therapy

## 2018-03-13 ENCOUNTER — Encounter: Payer: Self-pay | Admitting: Physical Therapy

## 2018-03-13 DIAGNOSIS — R279 Unspecified lack of coordination: Secondary | ICD-10-CM | POA: Insufficient documentation

## 2018-03-13 DIAGNOSIS — M6281 Muscle weakness (generalized): Secondary | ICD-10-CM | POA: Diagnosis not present

## 2018-03-13 NOTE — Patient Instructions (Addendum)
   [facility] [address], [address] [place], [place] [address] Phone # [phone] Fax [phone]  

## 2018-03-13 NOTE — Therapy (Signed)
[facility] [address], [place], [place], [address] Phone: [phone]   Fax:  [phone]  Physical Therapy Treatment  Patient Details  Name: Sheila Anderson MRN: [id number] Date of Birth: 10/29/1940 Referring Provider (PT): Dellis Filbert, [place]   Encounter Date: 03/13/2018  PT End of Session - 03/13/18 1103    Visit Number  5    Date for PT Re-Evaluation  04/24/18    Authorization Type  [employer] medicare    PT Start Time  1103    PT Stop Time  1143    PT Time Calculation (min)  40 min    Activity Tolerance  Patient tolerated treatment well       Past Medical History:  Diagnosis Date  . Allergy   . Anxiety   . Anxiety   . Arthritis   . Back pain   . Cataract   . Closed L1 vertebral fracture ([place])   . GERD (gastroesophageal reflux disease)   . History of hiatal hernia   . Neck pain   . Neuromuscular disorder ([place])   . Thyroid lesion     Past Surgical History:  Procedure Laterality Date  . CATARACT EXTRACTION Left    10 yrs ago  . cyst removal from bladder    . EYE SURGERY Left   . laproscopy     poly in colon  . REVERSE SHOULDER ARTHROPLASTY Right 03/10/2016   Procedure: RIGHT REVERSE SHOULDER ARTHROPLASTY;  Surgeon: Netta Cedars, MD;  Location: [facility];  Service: Orthopedics;  Laterality: Right;  . wrinkle in retina Left     There were no vitals filed for this visit.  Subjective Assessment - 03/13/18 1104    Subjective  I have still been noticing more prolapse.  I feel like I am coughing out my insides.    Patient Stated Goals  Feel the falling less frequent    Currently in Pain?  No/denies                       [facility] Adult PT Treatment/Exercise - 03/13/18 0001      Neuro Re-ed    Neuro Re-ed Details   contract and relax with biofeedback; difficutly relaxing and demonstrates muscle and fascial restrictions Rt>Lt; tactile cues for TrA contraction, tactile cues with knack      Manual Therapy   Manual Therapy  Internal Pelvic Floor    Manual therapy comments  pt informed and consent given to perform internal soft tissue mobs; pt identity confirmed    Internal Pelvic Floor  release to transvese peroneus, fascia, pubococcygeus Rt>Lt             PT Education - 03/13/18 1151    Education Details  compression garment    Person(s) Educated  Patient    Methods  Explanation;Demonstration;Handout    Comprehension  Verbalized understanding;Returned demonstration       PT Short Term Goals - 03/13/18 1207      PT SHORT TERM GOAL #2   Title  pt will report 50% less bearing down to have BM in order to put less stress on pelvic organs    Status  Achieved        PT Long Term Goals - 03/13/18 1207      PT LONG TERM GOAL #1   Title  Pt will be able to sustain contraction for 10 sec in order to reduce prolapse when cough    Baseline  2-3 sec  Time  6    Period  Weeks    Status  On-going    Target Date  04/24/18      PT LONG TERM GOAL #2   Title  Pt will report 30% reduction in leakage    Time  6    Period  Weeks    Status  On-going    Target Date  04/24/18      PT LONG TERM GOAL #3   Title  Pt will report 30% reduction in feeling of prolapse due to improved strength    Baseline  has increased last couple of weeks    Time  6    Period  Weeks    Status  On-going    Target Date  04/24/18      PT LONG TERM GOAL #4   Title  ind with advanced HEP    Baseline  still learning    Time  6    Period  Weeks    Status  On-going    Target Date  04/24/18      PT LONG TERM GOAL #5   Title  Pt will have improved resting tone and able to achieve 59mV or less resting tone with breathing exercises    Time  6    Period  Weeks    Status  New    Target Date  04/24/18            Plan - 03/13/18 1356    Clinical Impression Statement  Patient has been doing well with HEP, but not having success with stopping bladder prolapse.  Pt has a lot of tension and  her resting tone at beginning of session was >52mV.  Pt also has some bulging at naval and soreness at left inguinal region with abdominal contraction.  Pt will benefit from further PT to work on reduced tone so she will have improved muscle contractions in order to improve functio and quality of life    PT Treatment/Interventions  ADLs/Self Care Home Management;Biofeedback;Cryotherapy;Electrical Stimulation;Moist Heat;Therapeutic exercise;Therapeutic activities;Gait training;Neuromuscular re-education;Patient/family education;Manual techniques;Passive range of motion;Dry needling;Taping    PT Next Visit Plan  STM internally to reduce muscle tone, breathing techniques, work on contract relax, f/u on compression under garment    PT Home Exercise Plan   Access Code: [email or web address]     Consulted and Agree with Plan of Care  Patient       Patient will benefit from skilled therapeutic intervention in order to improve the following deficits and impairments:  Impaired tone, Increased muscle spasms, Decreased strength, Decreased coordination, Postural dysfunction  Visit Diagnosis: Muscle weakness (generalized)  Unspecified lack of coordination     Problem List Patient Active Problem List   Diagnosis Date Noted  . S/P shoulder replacement 03/10/2016  . Lumbosacral radiculopathy 02/09/2016  . Left shoulder pain 01/12/2015    Zannie Cove, PT 03/13/2018, 6:05 PM  [facility] [address], [place], [place], [address] Phone: [phone]   Fax:  [phone]  Name: Sheila Anderson MRN: [id number] Date of Birth: 10-06-1940

## 2018-03-20 ENCOUNTER — Ambulatory Visit: Payer: Medicare Other | Admitting: Physical Therapy

## 2018-03-20 DIAGNOSIS — R279 Unspecified lack of coordination: Secondary | ICD-10-CM

## 2018-03-20 DIAGNOSIS — M6281 Muscle weakness (generalized): Secondary | ICD-10-CM

## 2018-03-20 NOTE — Therapy (Signed)
[facility] [address], [place], [place], [address] Phone: [phone]   Fax:  [phone]  Physical Therapy Treatment  Patient Details  Name: Sheila Anderson MRN: [id number] Date of Birth: [birth year] Referring Provider (PT): Dellis Filbert, [place]   Encounter Date: 03/20/2018  PT End of Session - 03/20/18 1107    Visit Number  6    Date for PT Re-Evaluation  04/24/18    Authorization Type  [employer] medicare    PT Start Time  1105    PT Stop Time  1145    PT Time Calculation (min)  40 min    Activity Tolerance  Patient tolerated treatment well       Past Medical History:  Diagnosis Date  . Allergy   . Anxiety   . Anxiety   . Arthritis   . Back pain   . Cataract   . Closed L1 vertebral fracture ([place])   . GERD (gastroesophageal reflux disease)   . History of hiatal hernia   . Neck pain   . Neuromuscular disorder ([place])   . Thyroid lesion     Past Surgical History:  Procedure Laterality Date  . CATARACT EXTRACTION Left    10 yrs ago  . cyst removal from bladder    . EYE SURGERY Left   . laproscopy     poly in colon  . REVERSE SHOULDER ARTHROPLASTY Right 03/10/2016   Procedure: RIGHT REVERSE SHOULDER ARTHROPLASTY;  Surgeon: Netta Cedars, MD;  Location: [facility];  Service: Orthopedics;  Laterality: Right;  . wrinkle in retina Left     There were no vitals filed for this visit.  Subjective Assessment - 03/20/18 1109    Subjective  I took it easy this last week and it seems to be a little better.  I haven't looked into the spanks because I'm worried about it being hard to get off.    Patient Stated Goals  Feel the falling less frequent    Currently in Pain?  No/denies                       [facility] Adult PT Treatment/Exercise - 03/20/18 0001      Neuro Re-ed    Neuro Re-ed Details   rest 2.38mV; 6.2mV ave; contract and hold 10 7.44mV      Lumbar Exercises: Seated   Other Seated Lumbar Exercises   clam with yellow, marching - 10x each side with kegel and 2 sec hold    Other Seated Lumbar Exercises  ball squeeze with kegel x2 sec hold x 10             PT Education - 03/20/18 1142    Education Details   Access Code: [email or web address]     Person(s) Educated  Patient    Methods  Explanation;Demonstration;Handout;Verbal cues    Comprehension  Verbalized understanding;Returned demonstration       PT Short Term Goals - 03/13/18 1207      PT SHORT TERM GOAL #2   Title  pt will report 50% less bearing down to have BM in order to put less stress on pelvic organs    Status  Achieved        PT Long Term Goals - 03/13/18 1207      PT LONG TERM GOAL #1   Title  Pt will be able to sustain contraction for 10 sec in order to reduce prolapse when cough    Baseline  2-3 sec    Time  6    Period  Weeks    Status  On-going    Target Date  04/24/18      PT LONG TERM GOAL #2   Title  Pt will report 30% reduction in leakage    Time  6    Period  Weeks    Status  On-going    Target Date  04/24/18      PT LONG TERM GOAL #3   Title  Pt will report 30% reduction in feeling of prolapse due to improved strength    Baseline  has increased last couple of weeks    Time  6    Period  Weeks    Status  On-going    Target Date  04/24/18      PT LONG TERM GOAL #4   Title  ind with advanced HEP    Baseline  still learning    Time  6    Period  Weeks    Status  On-going    Target Date  04/24/18      PT LONG TERM GOAL #5   Title  Pt will have improved resting tone and able to achieve 43mV or less resting tone with breathing exercises    Time  6    Period  Weeks    Status  New    Target Date  04/24/18            Plan - 03/20/18 1148    Clinical Impression Statement  Patient demonstrates improved strength as well as improved resting tone.  She is able to perform quick contractions with improved strength of +2 mV and average of 10 sec hold improved by almost 23mV.  Pt did well with  sitting exercises today.  She will benefit from skilled PT to continue strengthening.      PT Treatment/Interventions  ADLs/Self Care Home Management;Biofeedback;Cryotherapy;Electrical Stimulation;Moist Heat;Therapeutic exercise;Therapeutic activities;Gait training;Neuromuscular re-education;Patient/family education;Manual techniques;Passive range of motion;Dry needling;Taping    PT Next Visit Plan  STM internally to reduce muscle tone, breathing techniques, work on contract relax, f/u on compression under garment, biofeedback or recommend home biofeedback equipment    PT Home Exercise Plan   Access Code: [email or web address]     Consulted and Agree with Plan of Care  Patient       Patient will benefit from skilled therapeutic intervention in order to improve the following deficits and impairments:  Impaired tone, Increased muscle spasms, Decreased strength, Decreased coordination, Postural dysfunction  Visit Diagnosis: Muscle weakness (generalized)  Unspecified lack of coordination     Problem List Patient Active Problem List   Diagnosis Date Noted  . S/P shoulder replacement 03/10/2016  . Lumbosacral radiculopathy 02/09/2016  . Left shoulder pain 01/12/2015    Zannie Cove, PT 03/20/2018, 12:20 PM  [facility] [address] [place], [place], [address] Phone: [phone]   Fax:  [phone]  Name: Sheila Anderson MRN: [id number] Date of Birth: 1940/12/09

## 2018-03-20 NOTE — Patient Instructions (Signed)
Access Code: [email or web address]  URL: [email or web address]  Date: 03/20/2018  Prepared by: Lovett Calender   Exercises  Seated Piriformis Stretch with Trunk Bend - 3 reps - 1 sets - 30 sec hold - 1x daily - 7x weekly  Hooklying Single Knee to Chest Stretch - 3 reps - 1 sets - 20 sec hold - 1x daily - 7x weekly  Seated Hamstring Stretch - 3 reps - 1 sets - 20 sec hold - 1x daily - 7x weekly  Seated March - 10 reps - 1 sets - 2 sec hold - 1x daily - 7x weekly  Seated Pelvic Floor Contraction with Isometric Hip Adduction - 10 reps - 1 sets - 2 sec hold - 1x daily - 7x weekly  Seated Hip Abduction - 10 reps - 1 sets - 2 sec hold - 1x daily - 7x weekly

## 2018-03-27 ENCOUNTER — Encounter: Payer: Self-pay | Admitting: Physical Therapy

## 2018-03-27 ENCOUNTER — Ambulatory Visit: Payer: Medicare Other | Admitting: Physical Therapy

## 2018-03-27 DIAGNOSIS — M6281 Muscle weakness (generalized): Secondary | ICD-10-CM | POA: Diagnosis not present

## 2018-03-27 DIAGNOSIS — R279 Unspecified lack of coordination: Secondary | ICD-10-CM

## 2018-03-27 NOTE — Therapy (Signed)
[facility] [address] [place], [place], [address] Phone: [phone]   Fax:  [phone]  Physical Therapy Treatment  Patient Details  Name: Sheila Anderson MRN: [id number] Date of Birth: 1940-07-18 Referring Provider (PT): Dellis Filbert, [place]   Encounter Date: 03/27/2018  PT End of Session - 03/27/18 1022    Visit Number  7    Date for PT Re-Evaluation  04/24/18    Authorization Type  [employer] medicare    PT Start Time  3614    PT Stop Time  1102    PT Time Calculation (min)  44 min    Activity Tolerance  Patient tolerated treatment well       Past Medical History:  Diagnosis Date  . Allergy   . Anxiety   . Anxiety   . Arthritis   . Back pain   . Cataract   . Closed L1 vertebral fracture ([place])   . GERD (gastroesophageal reflux disease)   . History of hiatal hernia   . Neck pain   . Neuromuscular disorder ([place])   . Thyroid lesion     Past Surgical History:  Procedure Laterality Date  . CATARACT EXTRACTION Left    10 yrs ago  . cyst removal from bladder    . EYE SURGERY Left   . laproscopy     poly in colon  . REVERSE SHOULDER ARTHROPLASTY Right 03/10/2016   Procedure: RIGHT REVERSE SHOULDER ARTHROPLASTY;  Surgeon: Netta Cedars, MD;  Location: [facility];  Service: Orthopedics;  Laterality: Right;  . wrinkle in retina Left     There were no vitals filed for this visit.  Subjective Assessment - 03/27/18 1021    Subjective  I was busy this week so I didn't have as much time to do the exercises.    Patient Stated Goals  Feel the falling less frequent    Currently in Pain?  No/denies                       [facility] Adult PT Treatment/Exercise - 03/27/18 0001      Neuro Re-ed    Neuro Re-ed Details   resting 3-102mV down from 69mV with cues and stretches; contract and relax 3 sec hold x 10; 5 sec hold x 5      Lumbar Exercises: Stretches   Piriformis Stretch  Right;Left;2 reps;20 seconds      Manual Therapy   Manual Therapy  Myofascial release    Myofascial Release  occipital, shoulder girdle, respiratory diaphragm               PT Short Term Goals - 03/13/18 1207      PT SHORT TERM GOAL #2   Title  pt will report 50% less bearing down to have BM in order to put less stress on pelvic organs    Status  Achieved        PT Long Term Goals - 03/27/18 1059      PT LONG TERM GOAL #1   Title  Pt will be able to sustain contraction for 10 sec in order to reduce prolapse when cough    Baseline  able to hold 4 sec    Status  On-going      PT LONG TERM GOAL #2   Title  Pt will report 30% reduction in leakage    Status  On-going      PT LONG TERM GOAL #3   Title  Pt will report 30% reduction in feeling of prolapse due to improved strength    Status  On-going      PT LONG TERM GOAL #4   Title  ind with advanced HEP    Status  On-going      PT LONG TERM GOAL #5   Title  Pt will have improved resting tone and able to achieve 51mV or less resting tone with breathing exercises    Baseline  needs cueing to relax    Status  On-going            Plan - 03/27/18 1102    Clinical Impression Statement  Patient had good fascial release at occipital region and felt release in her chest, demonstrating improved breath rate.  Pt was able to improve resting tone to 3-4 mV after piriformis stretch.  Pt was able to hold pelvic contraction for 4-5 sec with about 5-97mV of force.  Pt continues to need skilled PT to address impairments.    PT Treatment/Interventions  ADLs/Self Care Home Management;Biofeedback;Cryotherapy;Electrical Stimulation;Moist Heat;Therapeutic exercise;Therapeutic activities;Gait training;Neuromuscular re-education;Patient/family education;Manual techniques;Passive range of motion;Dry needling;Taping    PT Next Visit Plan  f/u on fascial release and add pelvic diaphragm if able to lie flat, biofeedback and continue to work on endurance    PT Home Exercise  Plan   Access Code: [email or web address]     Recommended Other Services  re-cert signed; eval 8/45/36; re-eval 03/13/18    Consulted and Agree with Plan of Care  Patient       Patient will benefit from skilled therapeutic intervention in order to improve the following deficits and impairments:  Impaired tone, Increased muscle spasms, Decreased strength, Decreased coordination, Postural dysfunction  Visit Diagnosis: Muscle weakness (generalized)  Unspecified lack of coordination     Problem List Patient Active Problem List   Diagnosis Date Noted  . S/P shoulder replacement 03/10/2016  . Lumbosacral radiculopathy 02/09/2016  . Left shoulder pain 01/12/2015    Zannie Cove, PT 03/27/2018, 11:16 AM  [facility] [address], [place], [place], [address] Phone: [phone]   Fax:  [phone]  Name: Sheila Anderson MRN: [id number] Date of Birth: 02-26-1941

## 2018-04-03 ENCOUNTER — Ambulatory Visit: Payer: Medicare Other | Admitting: Physical Therapy

## 2018-04-03 DIAGNOSIS — M6281 Muscle weakness (generalized): Secondary | ICD-10-CM

## 2018-04-03 DIAGNOSIS — R279 Unspecified lack of coordination: Secondary | ICD-10-CM

## 2018-04-03 NOTE — Therapy (Signed)
[facility] [address] [place], [place], [address] Phone: [phone]   Fax:  [phone]  Physical Therapy Treatment  Patient Details  Name: Sheila Anderson MRN: [id number] Date of Birth: 1941/02/28 Referring Provider (PT): Dellis Filbert, [place]   Encounter Date: 04/03/2018  PT End of Session - 04/03/18 1602    Visit Number  8    Date for PT Re-Evaluation  04/24/18    Authorization Type  [employer] medicare    PT Start Time  1103    PT Stop Time  1144    PT Time Calculation (min)  41 min    Activity Tolerance  Patient tolerated treatment well       Past Medical History:  Diagnosis Date  . Allergy   . Anxiety   . Anxiety   . Arthritis   . Back pain   . Cataract   . Closed L1 vertebral fracture ([place])   . GERD (gastroesophageal reflux disease)   . History of hiatal hernia   . Neck pain   . Neuromuscular disorder ([place])   . Thyroid lesion     Past Surgical History:  Procedure Laterality Date  . CATARACT EXTRACTION Left    10 yrs ago  . cyst removal from bladder    . EYE SURGERY Left   . laproscopy     poly in colon  . REVERSE SHOULDER ARTHROPLASTY Right 03/10/2016   Procedure: RIGHT REVERSE SHOULDER ARTHROPLASTY;  Surgeon: Netta Cedars, MD;  Location: [place];  Service: Orthopedics;  Laterality: Right;  . wrinkle in retina Left     There were no vitals filed for this visit.  Subjective Assessment - 04/03/18 1402    Subjective  The occipital release seems to help my chest open and am able to breathe better.  I was able to do that on myself.    Patient Stated Goals  Feel the falling less frequent    Currently in Pain?  No/denies                       [facility] Adult PT Treatment/Exercise - 04/03/18 0001      Manual Therapy   Manual Therapy  Myofascial release    Myofascial Release  occipital, shoulder girdle, pelvic diaphragms; frontal, parietal, temporal releases               PT  Short Term Goals - 03/13/18 1207      PT SHORT TERM GOAL #2   Title  pt will report 50% less bearing down to have BM in order to put less stress on pelvic organs    Status  Achieved        PT Long Term Goals - 03/27/18 1059      PT LONG TERM GOAL #1   Title  Pt will be able to sustain contraction for 10 sec in order to reduce prolapse when cough    Baseline  able to hold 4 sec    Status  On-going      PT LONG TERM GOAL #2   Title  Pt will report 30% reduction in leakage    Status  On-going      PT LONG TERM GOAL #3   Title  Pt will report 30% reduction in feeling of prolapse due to improved strength    Status  On-going      PT LONG TERM GOAL #4   Title  ind with advanced HEP  Status  On-going      PT LONG TERM GOAL #5   Title  Pt will have improved resting tone and able to achieve 65mV or less resting tone with breathing exercises    Baseline  needs cueing to relax    Status  On-going            Plan - 04/03/18 1604    Clinical Impression Statement  Pt states she felt her breathing improve more during today's treatment.  Pt tolerated fascial release throughout spine and was able to lie supine during treatment without coughing episode.  Pt continues to need skilled PT to improve trunk and pelvic floor stability.    PT Treatment/Interventions  ADLs/Self Care Home Management;Biofeedback;Cryotherapy;Electrical Stimulation;Moist Heat;Therapeutic exercise;Therapeutic activities;Gait training;Neuromuscular re-education;Patient/family education;Manual techniques;Passive range of motion;Dry needling;Taping    PT Next Visit Plan  f/u on fascial release, biofeedback and continue to work on endurance    PT Home Exercise Plan   Access Code: [email or web address]     Consulted and Agree with Plan of Care  Patient       Patient will benefit from skilled therapeutic intervention in order to improve the following deficits and impairments:  Impaired tone, Increased muscle spasms, Decreased  strength, Decreased coordination, Postural dysfunction  Visit Diagnosis: Muscle weakness (generalized)  Unspecified lack of coordination     Problem List Patient Active Problem List   Diagnosis Date Noted  . S/P shoulder replacement 03/10/2016  . Lumbosacral radiculopathy 02/09/2016  . Left shoulder pain 01/12/2015    Zannie Cove, PT 04/03/2018, 4:17 PM  [facility] [address] [place], [place], [address] Phone: [phone]   Fax:  [phone]  Name: Sheila Anderson MRN: [id number] Date of Birth: 11-Oct-1940

## 2018-04-07 IMAGING — US US ABDOMEN LIMITED
1 series · 14 of 25 positions shown · non-contrast
Comparison: No previous studies available for review.

CLINICAL DATA: Evaluate liver cyst observed on outside MRI ;
questionable hernia superior to the umbilicus

EXAM:
US ABDOMEN LIMITED - RIGHT UPPER QUADRANT

[Series 1: us abdomen limited · 0.07mm/px · 14 of 55 slices shown]
[im 1/55]
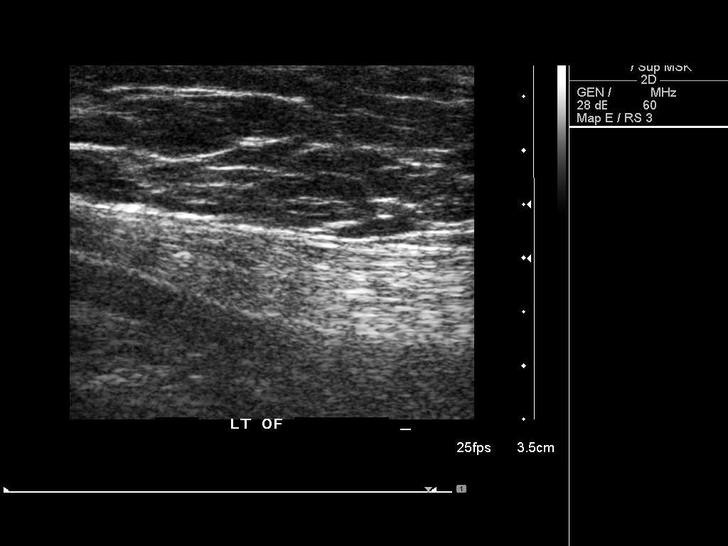
[im 5/55]
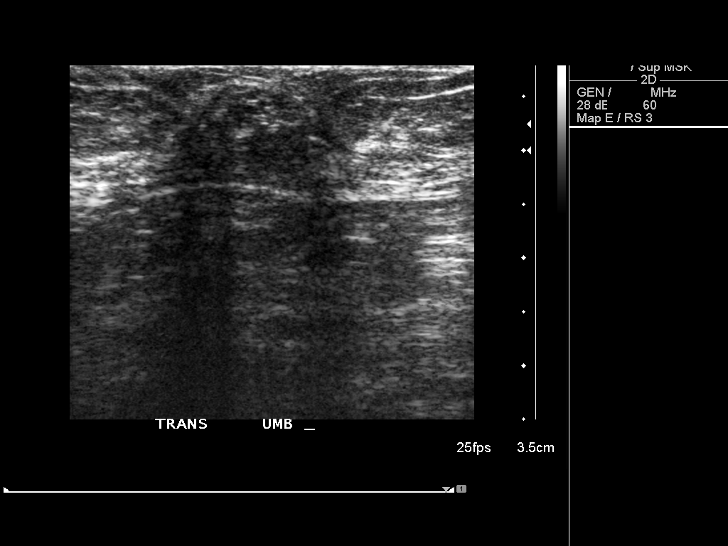
[im 10/55]
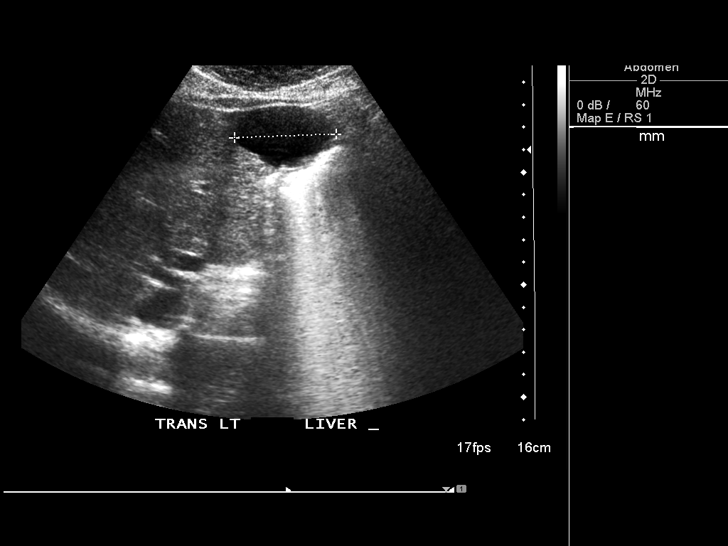
[im 14/55]
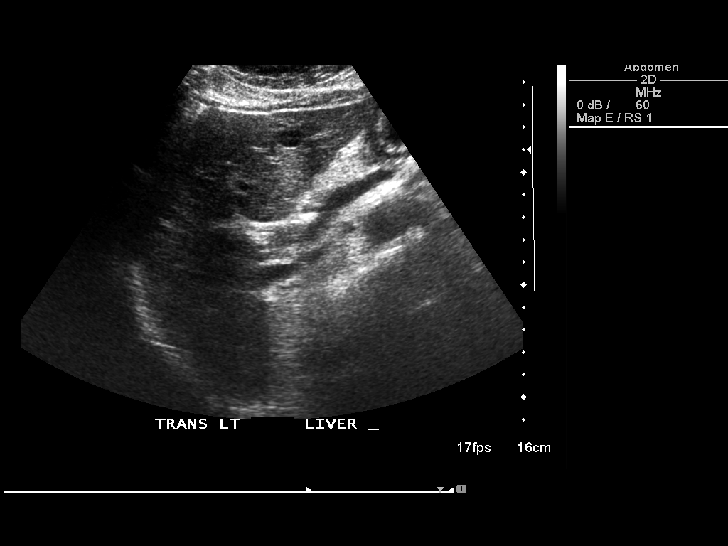
[im 19/55]
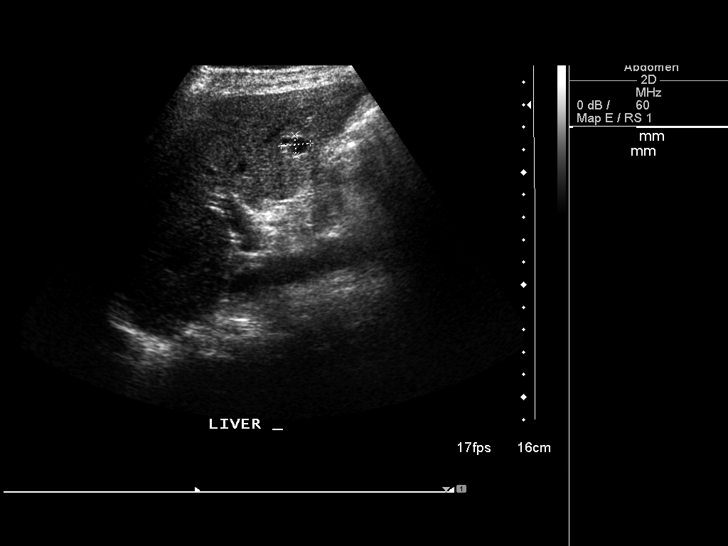
[im 21/55]
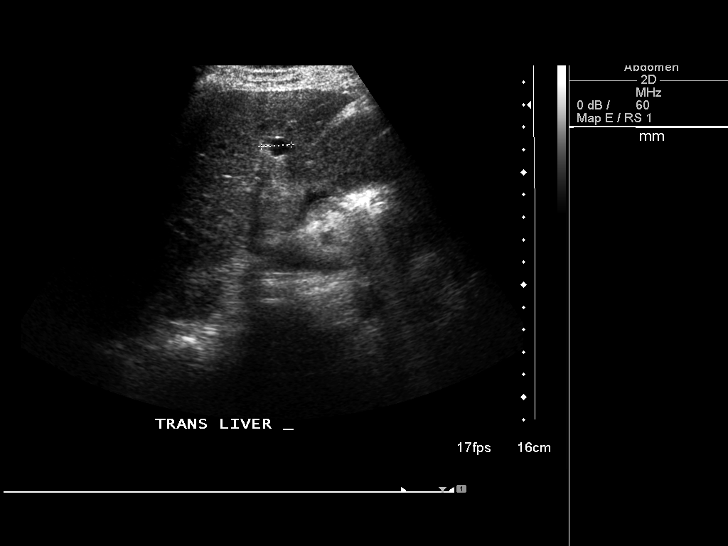
[im 25/55]
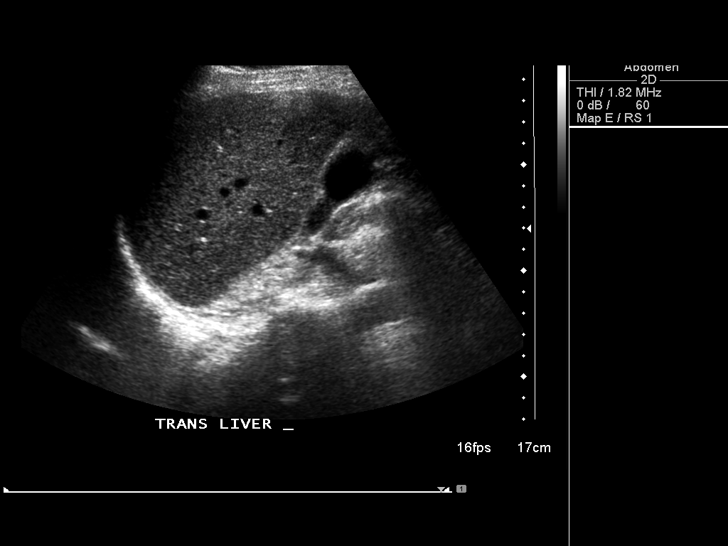
[im 30/55]
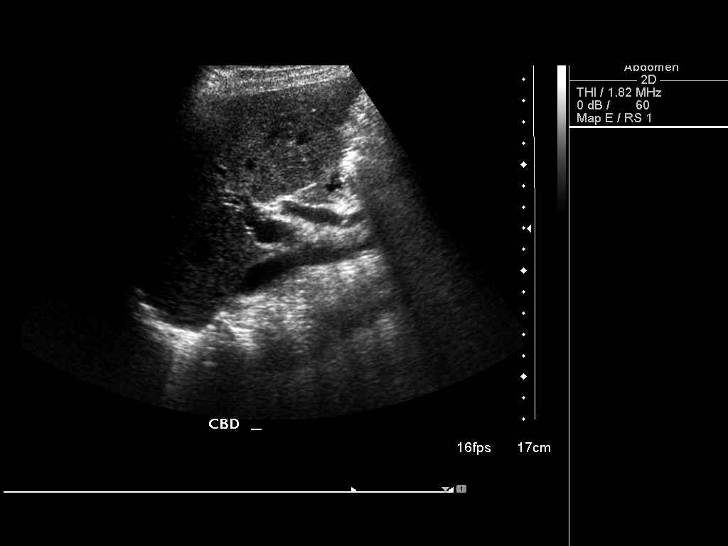
[im 34/55]
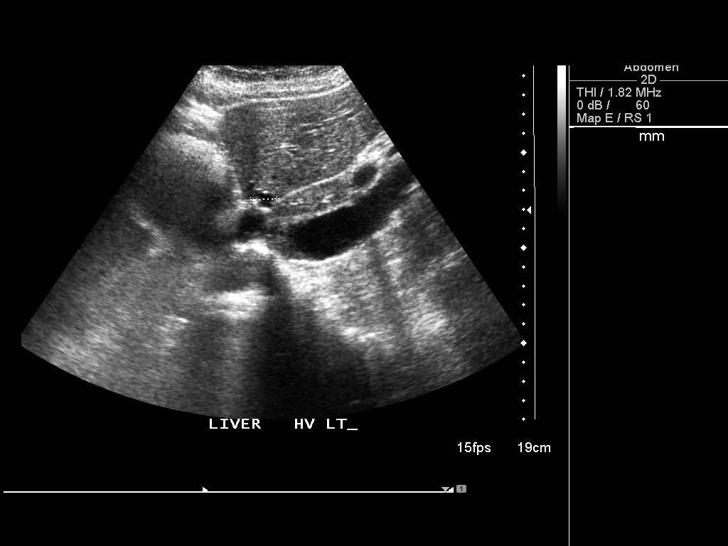
[im 37/55]
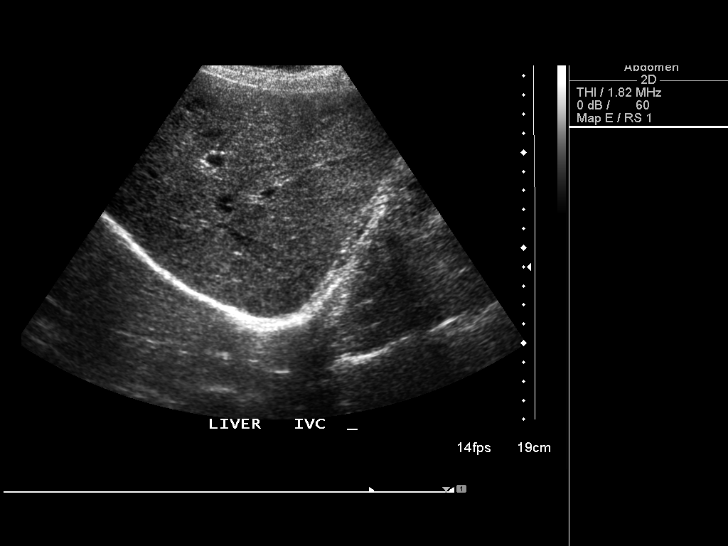
[im 41/55]
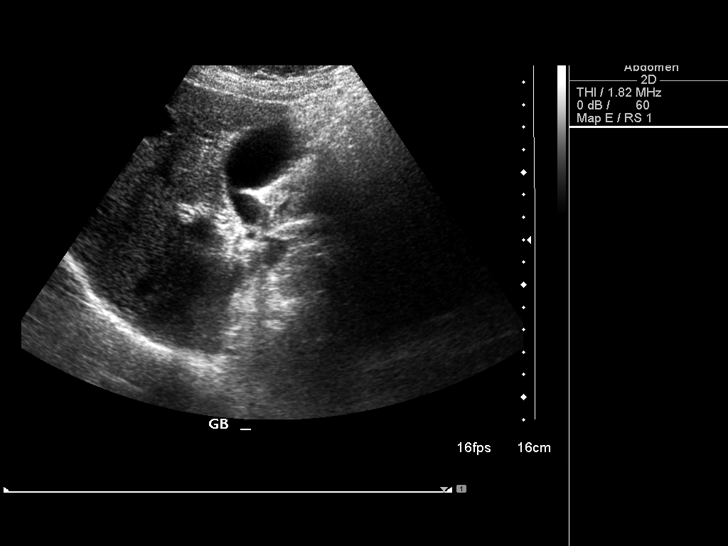
[im 46/55]
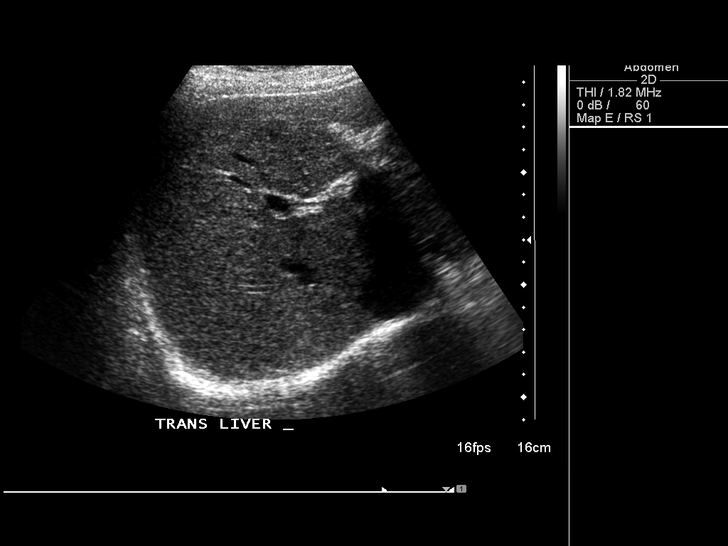
[im 50/55]
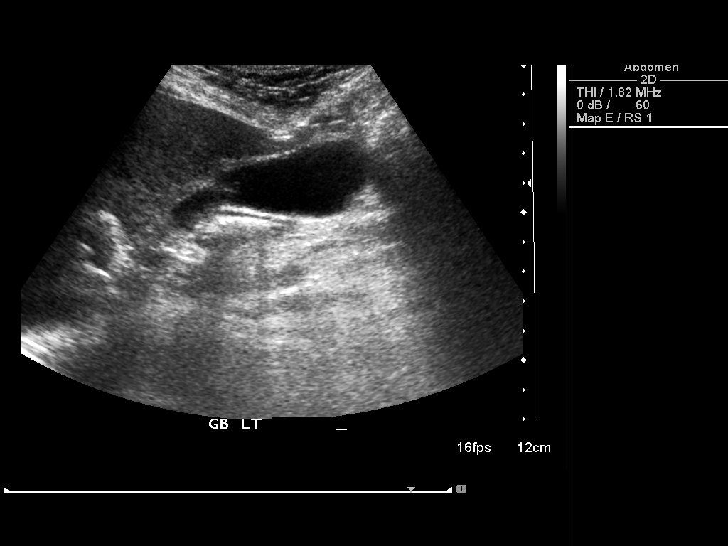
[im 55/55]
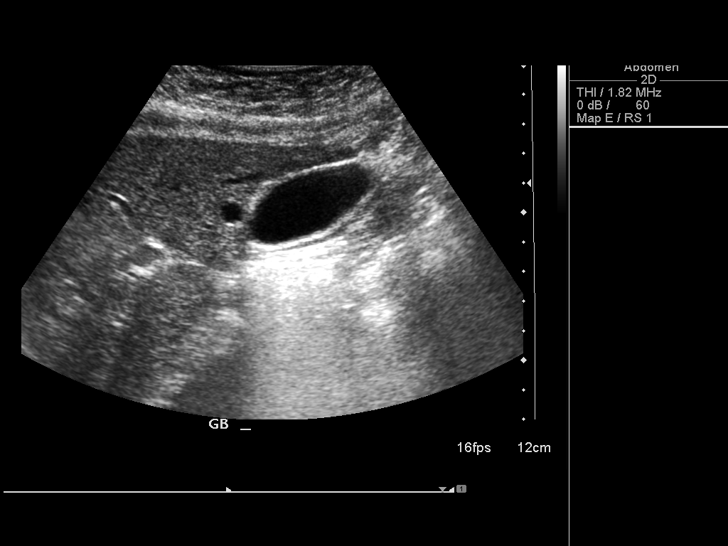

[14 of 25 positions shown; findings below may reference images not displayed]

FINDINGS: Gallbladder:

No gallstones or wall thickening visualized. No sonographic Murphy
sign noted by sonographer.

Common bile duct:

Diameter: 4 mm-8 mm

Liver:

The hepatic echotexture is normal. In the left hepatic lobe there is
a cyst measuring 4.5 x 2.6 x 3.9 cm. A second left lobe cyst
measures 1.3 x 1 x 1 cm. In the right lobe a cyst measures 2.4 x
x 1.7 cm. There is no solid mass. There is no intrahepatic ductal
dilation.

No periumbilical hernia sac is observed. The echotexture of the
abdominal wall soft tissues in the area of interest is normal.
IMPRESSION: 1. No periumbilical hernia is observed.
2. Simple appearing cysts in the left and right hepatic lobes. The
largest lies in the left lobe and measures 4.5 x 2.6 x 3.9 cm.
3. Normal appearance of the gallbladder and common bile duct.

## 2018-04-09 ENCOUNTER — Ambulatory Visit: Payer: Medicare Other | Admitting: Physical Therapy

## 2018-04-09 DIAGNOSIS — M6281 Muscle weakness (generalized): Secondary | ICD-10-CM

## 2018-04-09 DIAGNOSIS — R279 Unspecified lack of coordination: Secondary | ICD-10-CM

## 2018-04-09 NOTE — Therapy (Signed)
[facility] [address] [place], , [address] Phone: [phone]   Fax:  [phone]  Physical Therapy Treatment  Patient Details  Name: Sheila Anderson MRN: [id number] Date of Birth: 06/07/1941 Referring Provider (PT): LAVOIE, MARIE-LYNE   Encounter Date: 04/09/2018  PT End of Session - 04/09/18 0850    Visit Number  9    Date for PT Re-Evaluation  04/24/18    Authorization Type  [employer] medicare    PT Start Time  0847    PT Stop Time  0929    PT Time Calculation (min)  42 min    Activity Tolerance  Patient tolerated treatment well       Past Medical History:  Diagnosis Date  . Allergy   . Anxiety   . Anxiety   . Arthritis   . Back pain   . Cataract   . Closed L1 vertebral fracture ([facility])   . GERD (gastroesophageal reflux disease)   . History of hiatal hernia   . Neck pain   . Neuromuscular disorder ([facility])   . Thyroid lesion     Past Surgical History:  Procedure Laterality Date  . CATARACT EXTRACTION Left    10 yrs ago  . cyst removal from bladder    . EYE SURGERY Left   . laproscopy     poly in colon  . REVERSE SHOULDER ARTHROPLASTY Right 03/10/2016   Procedure: RIGHT REVERSE SHOULDER ARTHROPLASTY;  Surgeon: Steve Norris, MD;  [facility];  Service: Orthopedics;  Laterality: Right;  . wrinkle in retina Left     There were no vitals filed for this visit.  Subjective Assessment - 04/09/18 0848    Subjective  The coughing is not as much with the cranial release that I have been doing on myself.  It feels like the bladder is falling more and just falls and I push it back in.    Patient Stated Goals  Feel the falling less frequent    Currently in Pain?  No/denies                       [facility] Adult PT Treatment/Exercise - 04/09/18 0001      Manual Therapy   Manual Therapy  Myofascial release    Myofascial Release  occipital, shoulder girdle, pelvic diaphragms; temporal  releases, sacral/occipital glide, pelvic traction, occipital traction               PT Short Term Goals - 03/13/18 1207      PT SHORT TERM GOAL #2   Title  pt will report 50% less bearing down to have BM in order to put less stress on pelvic organs    Status  Achieved        PT Long Term Goals - 04/09/18 0943      PT LONG TERM GOAL #1   Title  Pt will be able to sustain contraction for 10 sec in order to reduce prolapse when cough    Status  Not Met      PT LONG TERM GOAL #2   Title  Pt will report 30% reduction in leakage    Baseline  better    Status  Partially Met      PT LONG TERM GOAL #3   Title  Pt will report 30% reduction in feeling of prolapse due to improved strength    Status  Not Met      PT   LONG TERM GOAL #4   Title  ind with advanced HEP    Status  Achieved      PT LONG TERM GOAL #5   Title  Pt will have improved resting tone and able to achieve 3mV or less resting tone with breathing exercises    Baseline  no re-assessed    Status  Not Met            Plan - 04/09/18 0940    Clinical Impression Statement  Pt is about the same since previous visit.  She is able to manage coughing a little more with self fascial release.  Pt will discharge today with HEP due to platueaed progress at this time    PT Treatment/Interventions  ADLs/Self Care Home Management;Biofeedback;Cryotherapy;Electrical Stimulation;Moist Heat;Therapeutic exercise;Therapeutic activities;Gait training;Neuromuscular re-education;Patient/family education;Manual techniques;Passive range of motion;Dry needling;Taping    PT Home Exercise Plan   Access Code: [email or web address]     Consulted and Agree with Plan of Care  Patient       Patient will benefit from skilled therapeutic intervention in order to improve the following deficits and impairments:  Impaired tone, Increased muscle spasms, Decreased strength, Decreased coordination, Postural dysfunction  Visit Diagnosis: Muscle weakness  (generalized)  Unspecified lack of coordination     Problem List Patient Active Problem List   Diagnosis Date Noted  . S/P shoulder replacement 03/10/2016  . Lumbosacral radiculopathy 02/09/2016  . Left shoulder pain 01/12/2015    Jakki Crosser, PT 04/09/2018, 9:45 AM  [facility] [address] [place], [place], [address] Phone: [phone]   Fax:  [phone]  Name: Sheila Anderson MRN: [id number] Date of Birth: 02/19/1941  PHYSICAL THERAPY DISCHARGE SUMMARY  Visits from Start of Care: 9  Current functional level related to goals / functional outcomes: See above   Remaining deficits: See above   Education / Equipment: HEP  Plan: Patient agrees to discharge.  Patient goals were partially met. Patient is being discharged due to lack of progress.  ?????     Jakki Crosser, PT 04/09/18 9:46 AM  

## 2018-04-16 ENCOUNTER — Ambulatory Visit: Payer: Medicare Other | Admitting: Physical Therapy

## 2018-07-02 ENCOUNTER — Telehealth: Payer: Self-pay | Admitting: Family Medicine

## 2018-07-02 NOTE — Telephone Encounter (Signed)
Called and spoke with pt regarding their appt with Dr. Brigitte Pulse on 07/05/18. Due to Dr. Brigitte Pulse being on emergency injury leave, I was able to get pt scheduled with Dr. Sheppard Coil on 07/05/18 at 1:20. I advised of time, building number and late policy. Pt acknowledged.

## 2018-07-05 ENCOUNTER — Encounter: Payer: Self-pay | Admitting: Osteopathic Medicine

## 2018-07-05 ENCOUNTER — Ambulatory Visit: Payer: Self-pay | Admitting: Family Medicine

## 2018-07-05 ENCOUNTER — Other Ambulatory Visit: Payer: Self-pay

## 2018-07-05 ENCOUNTER — Ambulatory Visit: Payer: Medicare Other | Admitting: Osteopathic Medicine

## 2018-07-05 ENCOUNTER — Other Ambulatory Visit: Payer: Self-pay | Admitting: Osteopathic Medicine

## 2018-07-05 ENCOUNTER — Ambulatory Visit (INDEPENDENT_AMBULATORY_CARE_PROVIDER_SITE_OTHER): Payer: Medicare Other

## 2018-07-05 VITALS — BP 127/78 | HR 84 | Temp 98.6°F | Resp 17 | Ht 68.0 in | Wt 144.2 lb

## 2018-07-05 DIAGNOSIS — R0781 Pleurodynia: Secondary | ICD-10-CM

## 2018-07-05 DIAGNOSIS — L309 Dermatitis, unspecified: Secondary | ICD-10-CM

## 2018-07-05 DIAGNOSIS — R0602 Shortness of breath: Secondary | ICD-10-CM | POA: Diagnosis not present

## 2018-07-05 DIAGNOSIS — R21 Rash and other nonspecific skin eruption: Secondary | ICD-10-CM

## 2018-07-05 DIAGNOSIS — R0789 Other chest pain: Secondary | ICD-10-CM | POA: Diagnosis not present

## 2018-07-05 DIAGNOSIS — R059 Cough, unspecified: Secondary | ICD-10-CM

## 2018-07-05 DIAGNOSIS — L2082 Flexural eczema: Secondary | ICD-10-CM

## 2018-07-05 DIAGNOSIS — M810 Age-related osteoporosis without current pathological fracture: Secondary | ICD-10-CM

## 2018-07-05 DIAGNOSIS — R05 Cough: Secondary | ICD-10-CM

## 2018-07-05 MED ORDER — ERGOCALCIFEROL 1.25 MG (50000 UT) PO CAPS: 50000.0000 [IU] | ORAL_CAPSULE | ORAL | 0 refills | Status: DC

## 2018-07-05 MED ORDER — CLOBETASOL PROPIONATE 0.05 % EX CREA: 1.0000 "application " | TOPICAL_CREAM | Freq: Two times a day (BID) | CUTANEOUS | 1 refills | Status: DC

## 2018-07-05 MED ORDER — ALENDRONATE SODIUM 70 MG PO TABS: 70.0000 mg | ORAL_TABLET | ORAL | 3 refills | Status: DC

## 2018-07-05 NOTE — Patient Instructions (Addendum)
X-rays appear normal. We will contact you with blood test results once these are available.  Recommend see ear doctor - please have the forward their records to our office.   Please follow-up with your primary care doctor for annual checkup when she is back in the office.  Otherwise, can see [place] as needed.        If you have lab work done today you will be contacted with your lab results within the next 2 weeks.  If you have not heard from [place] then please contact us. The fastest way to get your results is to register for My Chart.   IF you received an x-ray today, you will receive an invoice from [facility]. Please contact [facility] at [phone] with questions or concerns regarding your invoice.   IF you received labwork today, you will receive an invoice from [facility]. Please contact [facility] at [phone] with questions or concerns regarding your invoice.   Our billing staff will not be able to assist you with questions regarding bills from these companies.  You will be contacted with the lab results as soon as they are available. The fastest way to get your results is to activate your My Chart account. Instructions are located on the last page of this paperwork. If you have not heard from [place] regarding the results in 2 weeks, please contact this office.
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
## 2018-07-06 LAB — CMP14+EGFR
ALT: 14 IU/L (ref 0–32)
AST: 24 IU/L (ref 0–40)
Albumin/Globulin Ratio: 2.3 — ABNORMAL HIGH (ref 1.2–2.2)
Albumin: 4.3 g/dL (ref 3.7–4.7)
Alkaline Phosphatase: 78 IU/L (ref 39–117)
BUN/Creatinine Ratio: 22 (ref 12–28)
BUN: 15 mg/dL (ref 8–27)
Bilirubin Total: 0.5 mg/dL (ref 0.0–1.2)
CO2: 24 mmol/L (ref 20–29)
Calcium: 9.1 mg/dL (ref 8.7–10.3)
Chloride: 105 mmol/L (ref 96–106)
Creatinine, Ser: 0.68 mg/dL (ref 0.57–1.00)
GFR calc Af Amer: 98 mL/min/{1.73_m2} (ref >59)
GFR calc non Af Amer: 85 mL/min/{1.73_m2} (ref >59)
Globulin, Total: 1.9 g/dL (ref 1.5–4.5)
Glucose: 80 mg/dL (ref 65–99)
Potassium: 4.3 mmol/L (ref 3.5–5.2)
Sodium: 143 mmol/L (ref 134–144)
Total Protein: 6.2 g/dL (ref 6.0–8.5)

## 2018-07-06 LAB — LIPID PANEL
CHOLESTEROL TOTAL: 295 mg/dL — AB (ref 100–199)
Chol/HDL Ratio: 3.7 ratio (ref 0.0–4.4)
HDL: 79 mg/dL (ref >39)
LDL Calculated: 204 mg/dL — ABNORMAL HIGH (ref 0–99)
Triglycerides: 62 mg/dL (ref 0–149)
VLDL Cholesterol Cal: 12 mg/dL (ref 5–40)

## 2018-07-06 LAB — CBC
Hematocrit: 38.2 % (ref 34.0–46.6)
Hemoglobin: 12.8 g/dL (ref 11.1–15.9)
MCH: 31.7 pg (ref 26.6–33.0)
MCHC: 33.5 g/dL (ref 31.5–35.7)
MCV: 95 fL (ref 79–97)
Platelets: 238 10*3/uL (ref 150–450)
RBC: 4.04 x10E6/uL (ref 3.77–5.28)
RDW: 12.6 % (ref 11.7–15.4)
WBC: 3.6 10*3/uL (ref 3.4–10.8)

## 2018-07-06 LAB — BRAIN NATRIURETIC PEPTIDE: BNP: 38.8 pg/mL (ref 0.0–100.0)

## 2018-07-06 LAB — VITAMIN D 25 HYDROXY (VIT D DEFICIENCY, FRACTURES): Vit D, 25-Hydroxy: 31.4 ng/mL (ref 30.0–100.0)

## 2018-07-15 ENCOUNTER — Telehealth: Payer: Self-pay | Admitting: *Deleted

## 2018-07-15 ENCOUNTER — Encounter: Payer: Self-pay | Admitting: Family Medicine

## 2018-07-15 ENCOUNTER — Telehealth: Payer: Self-pay | Admitting: Family Medicine

## 2018-07-15 NOTE — Telephone Encounter (Signed)
Provided lab results per 07/03/08 Sheila Reeve DO patient voices understanding. Does inquires  What does Dr.  Mean by Protein levels by Protein levels slightly elevated.

## 2018-07-15 NOTE — Telephone Encounter (Signed)
Please inform patient   Few abnormalities on labs  Cholesterol was quite high, may benefit from cholesterol-lowering medication  Protein levels slightly abnormal, I''m not too worried about it but may want to repeat these levels in a month or two  Please ask that she follow up with her PCP on these issues when Dr Brigitte Pulse is back in office

## 2018-07-18 NOTE — Telephone Encounter (Signed)
This encounter was created in error - please disregard.

## 2018-07-19 NOTE — Progress Notes (Signed)
I have attempted to call pt and there was no answer. I left a message to call back

## 2018-07-24 ENCOUNTER — Other Ambulatory Visit: Payer: Self-pay | Admitting: Osteopathic Medicine

## 2018-07-24 DIAGNOSIS — R21 Rash and other nonspecific skin eruption: Secondary | ICD-10-CM

## 2018-07-25 NOTE — Telephone Encounter (Signed)
I called pt and left vm stating that her cholesterol was quite high, may benefit from cholesterol-lowering medication. Protein levels slightly abnormal, and that the provider was not too worried about it but may want to repeat levels in a month or two and to call our office back to schedule an appointment.

## 2018-08-12 ENCOUNTER — Telehealth: Payer: Self-pay | Admitting: Family Medicine

## 2018-08-12 NOTE — Telephone Encounter (Signed)
Called patient in regards to provider leaving the office. Her appt scheduled for 08/26/2018 has been cancelled.

## 2018-08-26 ENCOUNTER — Ambulatory Visit: Payer: Self-pay | Admitting: Family Medicine

## 2018-08-27 ENCOUNTER — Other Ambulatory Visit: Payer: Self-pay

## 2018-08-27 ENCOUNTER — Encounter: Payer: Self-pay | Admitting: Emergency Medicine

## 2018-08-27 ENCOUNTER — Ambulatory Visit: Payer: Medicare Other | Admitting: Emergency Medicine

## 2018-08-27 VITALS — BP 120/72 | HR 72 | Temp 98.4°F | Resp 16 | Ht 65.5 in | Wt 143.8 lb

## 2018-08-27 DIAGNOSIS — R21 Rash and other nonspecific skin eruption: Secondary | ICD-10-CM

## 2018-08-27 DIAGNOSIS — E785 Hyperlipidemia, unspecified: Secondary | ICD-10-CM

## 2018-08-27 DIAGNOSIS — N8110 Cystocele, unspecified: Secondary | ICD-10-CM

## 2018-08-27 DIAGNOSIS — K429 Umbilical hernia without obstruction or gangrene: Secondary | ICD-10-CM | POA: Diagnosis not present

## 2018-08-27 MED ORDER — ROSUVASTATIN CALCIUM 10 MG PO TABS: 10.0000 mg | ORAL_TABLET | Freq: Every day | ORAL | 3 refills | Status: DC

## 2018-08-27 MED ORDER — FLUOCINONIDE 0.05 % EX CREA: 1.0000 "application " | TOPICAL_CREAM | Freq: Two times a day (BID) | CUTANEOUS | 3 refills | Status: DC
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
## 2018-08-27 NOTE — Patient Instructions (Addendum)
If you have lab work done today you will be contacted with your lab results within the next 2 weeks.  If you have not heard from [place] then please contact us. The fastest way to get your results is to register for My Chart.   IF you received an x-ray today, you will receive an invoice from [facility]. Please contact [facility] at [phone] with questions or concerns regarding your invoice.   IF you received labwork today, you will receive an invoice from [facility]. Please contact [facility] at [phone] with questions or concerns regarding your invoice.   Our billing staff will not be able to assist you with questions regarding bills from these companies.  You will be contacted with the lab results as soon as they are available. The fastest way to get your results is to activate your My Chart account. Instructions are located on the last page of this paperwork. If you have not heard from [place] regarding the results in 2 weeks, please contact this office.     Dyslipidemia Dyslipidemia is an imbalance of waxy, fat-like substances (lipids) in the blood. The body needs lipids in small amounts. Dyslipidemia often involves a high level of cholesterol or triglycerides, which are types of lipids. Common forms of dyslipidemia include:  High levels of LDL cholesterol. LDL is the type of cholesterol that causes fatty deposits (plaques) to build up in the blood vessels that carry blood away from your heart (arteries).  Low levels of HDL cholesterol. HDL cholesterol is the type of cholesterol that protects against heart disease. High levels of HDL remove the LDL buildup from arteries.  High levels of triglycerides. Triglycerides are a fatty substance in the blood that is linked to a buildup of plaques in the arteries. What are the causes? Primary dyslipidemia is caused by changes (mutations) in genes that are passed down through families (inherited). These mutations cause several  types of dyslipidemia. Secondary dyslipidemia is caused by lifestyle choices and diseases that lead to dyslipidemia, such as:  Eating a diet that is high in animal fat.  Not getting enough exercise.  Having diabetes, kidney disease, liver disease, or thyroid disease.  Drinking large amounts of alcohol.  Using certain medicines. What increases the risk? You are more likely to develop this condition if you are an older man or if you are a woman who has gone through menopause. Other risk factors include:  Having a family history of dyslipidemia.  Taking certain medicines, including birth control pills, steroids, some diuretics, and beta-blockers.  Smoking cigarettes.  Eating a high-fat diet.  Having certain medical conditions such as diabetes, polycystic ovary syndrome (PCOS), kidney disease, liver disease, or hypothyroidism.  Not exercising regularly.  Being overweight or obese with too much belly fat. What are the signs or symptoms? In most cases, dyslipidemia does not usually cause any symptoms. In severe cases, very high lipid levels can cause:  Fatty bumps under the skin (xanthomas).  White or gray ring around the black center (pupil) of the eye. Very high triglyceride levels can cause inflammation of the pancreas (pancreatitis). How is this diagnosed? Your health care provider may diagnose dyslipidemia based on a routine blood test (fasting blood test). Because most people do not have symptoms of the condition, this blood testing (lipid profile) is done on adults age 12 and older and is repeated every 5 years. This test checks:  Total cholesterol. This measures the total amount of cholesterol in your blood, including LDL  cholesterol, HDL cholesterol, and triglycerides. A healthy number is below 200.  LDL cholesterol. The target number for LDL cholesterol is different for each person, depending on individual risk factors. Ask your health care provider what your LDL  cholesterol should be.  HDL cholesterol. An HDL level of 60 or higher is best because it helps to protect against heart disease. A number below 40 for men or below 50 for women increases the risk for heart disease.  Triglycerides. A healthy triglyceride number is below 150. If your lipid profile is abnormal, your health care provider may do other blood tests. How is this treated? Treatment depends on the type of dyslipidemia that you have and your other risk factors for heart disease and stroke. Your health care provider will have a target range for your lipid levels based on this information. For many people, this condition may be treated by lifestyle changes, such as diet and exercise. Your health care provider may recommend that you:  Get regular exercise.  Make changes to your diet.  Quit smoking if you smoke. If diet changes and exercise do not help you reach your goals, your health care provider may also prescribe medicine to lower lipids. The most commonly prescribed type of medicine lowers your LDL cholesterol (statin drug). If you have a high triglyceride level, your provider may prescribe another type of drug (fibrate) or an omega-3 fish oil supplement, or both. Follow these instructions at home:  Eating and drinking  Follow instructions from your health care provider or dietitian about eating or drinking restrictions.  Eat a healthy diet as told by your health care provider. This can help you reach and maintain a healthy weight, lower your LDL cholesterol, and raise your HDL cholesterol. This may include: ? Limiting your calories, if you are overweight. ? Eating more fruits, vegetables, whole grains, fish, and lean meats. ? Limiting saturated fat, trans fat, and cholesterol.  If you drink alcohol: ? Limit how much you use. ? Be aware of how much alcohol is in your drink. In the [place] one drink equals one 12 oz bottle of beer (355 mL), one 5 oz glass of wine (148 mL), or one 1  oz glass of hard liquor (44 mL).  Do not drink alcohol if: ? Your health care provider tells you not to drink. ? You are pregnant, may be pregnant, or are planning to become pregnant. Activity  Get regular exercise. Start an exercise and strength training program as told by your health care provider. Ask your health care provider what activities are safe for you. Your health care provider may recommend: ? 30 minutes of aerobic activity 4-6 days a week. Brisk walking is an example of aerobic activity. ? Strength training 2 days a week. General instructions  Do not use any products that contain nicotine or tobacco, such as cigarettes, e-cigarettes, and chewing tobacco. If you need help quitting, ask your health care provider.  Take over-the-counter and prescription medicines only as told by your health care provider. This includes supplements.  Keep all follow-up visits as told by your health care provider. Contact a health care provider if:  You are: ? Having trouble sticking to your exercise or diet plan. ? Struggling to quit smoking or control your use of alcohol. Summary  Dyslipidemia often involves a high level of cholesterol or triglycerides, which are types of lipids.  Treatment depends on the type of dyslipidemia that you have and your other risk factors for heart disease and   stroke.  For many people, treatment starts with lifestyle changes, such as diet and exercise.  Your health care provider may prescribe medicine to lower lipids. This information is not intended to replace advice given to you by your health care provider. Make sure you discuss any questions you have with your health care provider. Document Released: 06/03/2013 Document Revised: 01/21/2018 Document Reviewed: 12/28/2017 Elsevier Interactive Patient Education  2019 [employer]  

## 2018-08-28 ENCOUNTER — Encounter: Payer: Self-pay | Admitting: Radiology

## 2018-08-28 LAB — COMPREHENSIVE METABOLIC PANEL
ALBUMIN: 4.6 g/dL (ref 3.7–4.7)
ALT: 25 IU/L (ref 0–32)
AST: 29 IU/L (ref 0–40)
Albumin/Globulin Ratio: 2.9 — ABNORMAL HIGH (ref 1.2–2.2)
Alkaline Phosphatase: 72 IU/L (ref 39–117)
BUN/Creatinine Ratio: 13 (ref 12–28)
BUN: 11 mg/dL (ref 8–27)
Bilirubin Total: 0.5 mg/dL (ref 0.0–1.2)
CO2: 23 mmol/L (ref 20–29)
Calcium: 9.8 mg/dL (ref 8.7–10.3)
Chloride: 104 mmol/L (ref 96–106)
Creatinine, Ser: 0.82 mg/dL (ref 0.57–1.00)
GFR calc Af Amer: 80 mL/min/{1.73_m2} (ref >59)
GFR, EST NON AFRICAN AMERICAN: 70 mL/min/{1.73_m2} (ref >59)
Globulin, Total: 1.6 g/dL (ref 1.5–4.5)
Glucose: 79 mg/dL (ref 65–99)
Potassium: 4.4 mmol/L (ref 3.5–5.2)
Sodium: 143 mmol/L (ref 134–144)
Total Protein: 6.2 g/dL (ref 6.0–8.5)

## 2018-08-28 LAB — LIPID PANEL
Chol/HDL Ratio: 3.6 ratio (ref 0.0–4.4)
Cholesterol, Total: 266 mg/dL — ABNORMAL HIGH (ref 100–199)
HDL: 73 mg/dL (ref >39)
LDL Calculated: 179 mg/dL — ABNORMAL HIGH (ref 0–99)
Triglycerides: 71 mg/dL (ref 0–149)
VLDL Cholesterol Cal: 14 mg/dL (ref 5–40)

## 2018-08-29 LAB — HM DEXA SCAN: HM Dexa Scan: ABNORMAL

## 2018-09-02 ENCOUNTER — Encounter: Payer: Self-pay | Admitting: *Deleted

## 2018-09-09 ENCOUNTER — Encounter: Payer: Self-pay | Admitting: *Deleted

## 2018-09-19 ENCOUNTER — Encounter: Payer: Self-pay | Admitting: Radiology

## 2018-09-24 ENCOUNTER — Telehealth: Payer: Self-pay | Admitting: Emergency Medicine

## 2018-09-24 NOTE — Telephone Encounter (Signed)
Copied from [place] [id number]. Topic: General - Other >> Sep 24, 2018  2:51 PM Leward Quan A wrote: Reason for CRM: Patient called to say that she received the results of her blood work for 08/27/2018 but did not receive the results of her urine. Say that she left a sample on that day also. Please call patient at Ph# [phone] leave message no caller ID.

## 2018-09-24 NOTE — Telephone Encounter (Signed)
Urine test was not ordered/done during that visit.  Thanks.

## 2018-11-25 ENCOUNTER — Other Ambulatory Visit: Payer: Self-pay | Admitting: General Surgery

## 2018-11-25 DIAGNOSIS — K432 Incisional hernia without obstruction or gangrene: Secondary | ICD-10-CM

## 2018-12-05 ENCOUNTER — Other Ambulatory Visit: Payer: Self-pay

## 2018-12-06 ENCOUNTER — Ambulatory Visit: Payer: Medicare Other | Admitting: Obstetrics & Gynecology

## 2018-12-06 ENCOUNTER — Encounter: Payer: Self-pay | Admitting: Obstetrics & Gynecology

## 2018-12-06 VITALS — BP 126/78

## 2018-12-06 DIAGNOSIS — N950 Postmenopausal bleeding: Secondary | ICD-10-CM | POA: Diagnosis not present

## 2018-12-06 DIAGNOSIS — N813 Complete uterovaginal prolapse: Secondary | ICD-10-CM

## 2018-12-06 NOTE — Progress Notes (Signed)
    Sheila Anderson 19-Jan-1941 [id number]        78 y.o.  G2P2L2  RP: PMB x 07/2018 and complete uterine prolapse  HPI: Complete uterine prolapse with irritation.  Spotting when active most days x 07/2018.  No pelvic pain.  Followed by Dr Matilde Sprang for Urinary incontinence.  Did PT to reinforce her pelvic floor.  Has a chronic cough probably associated with asthma and GERD.  Abstinent.   OB History  Gravida Para Term Preterm AB Living  2 2       2   SAB TAB Ectopic Multiple Live Births               # Outcome Date GA Lbr Len/2nd Weight Sex Delivery Anes PTL Lv  2 Para           1 Para             Past medical history,surgical history, problem list, medications, allergies, family history and social history were all reviewed and documented in the EPIC chart.   Directed ROS with pertinent positives and negatives documented in the history of present illness/assessment and plan.  Exam:  Vitals:   12/06/18 1023  BP: 126/78   General appearance:  Normal  Abdomen: Normal  Gynecologic exam: Vulva normal.  Complete uterine prolapse with cervix and vagina visible at vulva, protruding by about 5 cm in laying down position.  Cervix irritated.  No active bleeding.  Bimanual exam:  Uterus normal volume, no adnexal mass.   Assessment/Plan:  78 y.o. G2P2   1. Postmenopausal bleeding Postmenopausal spotting probably associated with complete uterine prolapse with irritation of cervix.  Pelvic ultrasound at follow-up, rule out endometrial pathology such as endometrial polyp, submucosal myoma, endometrial hyperplasia and endometrial cancer. - US Transvaginal Non-OB; Future  2. Complete uterine prolapse with prolapse of anterior vaginal wall Complete uterine prolapse with anterior vaginal wall prolapse.  Management approaches reviewed thoroughly with patient including pessary management and robotic sacrocolpopexy.  Decision to follow-up as soon as possible to try a pessary first.  If the  decision is eventually made to proceed with a robotic sacrocolpopexy, patient will be reassessed by Dr. Matilde Sprang, her urologist, to decide if any sling procedure would be indicated.  Counseling on above issues and coordination of care more than 50% for 25 minutes.  [name] MD, 10:33 AM 12/06/2018

## 2018-12-06 NOTE — Patient Instructions (Signed)
1. Postmenopausal bleeding Postmenopausal spotting probably associated with complete uterine prolapse with irritation of cervix.  Pelvic ultrasound at follow-up, rule out endometrial pathology such as endometrial polyp, submucosal myoma, endometrial hyperplasia and endometrial cancer. - US Transvaginal Non-OB; Future  2. Complete uterine prolapse with prolapse of anterior vaginal wall Complete uterine prolapse with anterior vaginal wall prolapse.  Management approaches reviewed thoroughly with patient including pessary management and robotic sacrocolpopexy.  Decision to follow-up as soon as possible to try a pessary first.  If the decision is eventually made to proceed with a robotic sacrocolpopexy, patient will be reassessed by Dr. Matilde Sprang, her urologist, to decide if any sling procedure would be indicated.  Sheila Anderson, it was a pleasure seeing you today!   Pelvic Organ Prolapse Pelvic organ prolapse is the stretching, bulging, or dropping of pelvic organs into an abnormal position. It happens when the muscles and tissues that surround and support pelvic structures become weak or stretched. Pelvic organ prolapse can involve the:  Vagina (vaginal prolapse).  Uterus (uterine prolapse).  Bladder (cystocele).  Rectum (rectocele).  Intestines (enterocele). When organs other than the vagina are involved, they often bulge into the vagina or protrude from the vagina, depending on how severe the prolapse is. What are the causes? This condition may be caused by:  Pregnancy, labor, and childbirth.  Past pelvic surgery.  Decreased production of the hormone estrogen associated with menopause.  Consistently lifting more than 50 lb (23 kg).  Obesity.  Long-term inability to pass stool (chronic constipation).  A cough that lasts a long time (chronic).  Buildup of fluid in the abdomen due to certain diseases and other conditions. What are the signs or symptoms? Symptoms of this condition  include:  Passing a little urine (loss of bladder control) when you cough, sneeze, strain, and exercise (stress incontinence). This may be worse immediately after childbirth. It may gradually improve over time.  Feeling pressure in your pelvis or vagina. This pressure may increase when you cough or when you are passing stool.  A bulge that protrudes from the opening of your vagina.  Difficulty passing urine or stool.  Pain in your lower back.  Pain, discomfort, or disinterest in sex.  Repeated bladder infections (urinary tract infections).  Difficulty inserting a tampon. In some people, this condition causes no symptoms. How is this diagnosed? This condition may be diagnosed based on a vaginal and rectal exam. During the exam, you may be asked to cough and strain while you are lying down, sitting, and standing up. Your health care provider will determine if other tests are required, such as bladder function tests. How is this treated? Treatment for this condition may depend on your symptoms. Treatment may include:  Lifestyle changes, such as changes to your diet.  Emptying your bladder at scheduled times (bladder training therapy). This can help reduce or avoid urinary incontinence.  Estrogen. Estrogen may help mild prolapse by increasing the strength and tone of pelvic floor muscles.  Kegel exercises. These may help mild cases of prolapse by strengthening and tightening the muscles of the pelvic floor.  A soft, flexible device that helps support the vaginal walls and keep pelvic organs in place (pessary). This is inserted into your vagina by your health care provider.  Surgery. This is often the only form of treatment for severe prolapse. Follow these instructions at home:  Avoid drinking beverages that contain caffeine or alcohol.  Increase your intake of high-fiber foods. This can help decrease constipation and straining during  bowel movements.  Lose weight if recommended  by your health care provider.  Wear a sanitary pad or adult diapers if you have urinary incontinence.  Avoid heavy lifting and straining with exercise and work. Do not hold your breath when you perform mild to moderate lifting and exercise activities. Limit your activities as directed by your health care provider.  Do Kegel exercises as directed by your health care provider. To do this: ? Squeeze your pelvic floor muscles tight. You should feel a tight lift in your rectal area and a tightness in your vaginal area. Keep your stomach, buttocks, and legs relaxed. ? Hold the muscles tight for up to 10 seconds. ? Relax your muscles. ? Repeat this exercise 50 times a day, or as many times as told by your health care provider. Continue to do this exercise for at least 4-6 weeks, or for as long as told by your health care provider.  Take over-the-counter and prescription medicines only as told by your health care provider.  If you have a pessary, take care of it as told by your health care provider.  Keep all follow-up visits as told by your health care provider. This is important. Contact a health care provider if you:  Have symptoms that interfere with your daily activities or sex life.  Need medicine to help with the discomfort.  Notice bleeding from your vagina that is not related to your period.  Have a fever.  Have pain or bleeding when you urinate.  Have bleeding when you pass stool.  Pass urine when you have sex.  Have chronic constipation.  Have a pessary that falls out.  Have bad smelling vaginal discharge.  Have an unusual, low pain in your abdomen. Summary  Pelvic organ prolapse is the stretching, bulging, or dropping of pelvic organs into an abnormal position. It happens when the muscles and tissues that surround and support pelvic structures become weak or stretched.  When organs other than the vagina are involved, they often bulge into the vagina or protrude from  the vagina, depending on how severe the prolapse is.  In most cases, this condition needs to be treated only if it produces symptoms. Treatment may include lifestyle changes, estrogen, Kegel exercises, pessary insertion, or surgery.  Avoid heavy lifting and straining with exercise and work. Do not hold your breath when you perform mild to moderate lifting and exercise activities. Limit your activities as directed by your health care provider. This information is not intended to replace advice given to you by your health care provider. Make sure you discuss any questions you have with your health care provider. Document Released: 12/24/2013 Document Revised: 06/20/2017 Document Reviewed: 06/20/2017 Elsevier Interactive Patient Education  2019 [employer].

## 2018-12-11 ENCOUNTER — Ambulatory Visit
Admission: RE | Admit: 2018-12-11 | Discharge: 2018-12-11 | Disposition: A | Discharge status: Home / Self Care | Payer: Medicare Other | Source: Ambulatory Visit | Attending: General Surgery | Admitting: General Surgery

## 2018-12-11 ENCOUNTER — Other Ambulatory Visit: Payer: Self-pay

## 2018-12-11 DIAGNOSIS — K432 Incisional hernia without obstruction or gangrene: Secondary | ICD-10-CM

## 2018-12-11 MED ORDER — IOPAMIDOL (ISOVUE-300) INJECTION 61%
100.0000 mL | Freq: Once | INTRAVENOUS | Status: AC | PRN
  Administered 2018-12-11: 100 mL via INTRAVENOUS

## 2018-12-19 ENCOUNTER — Other Ambulatory Visit: Payer: Self-pay

## 2018-12-20 ENCOUNTER — Encounter: Payer: Self-pay | Admitting: Obstetrics & Gynecology

## 2018-12-20 ENCOUNTER — Ambulatory Visit: Payer: Medicare Other | Admitting: Obstetrics & Gynecology

## 2018-12-20 ENCOUNTER — Other Ambulatory Visit: Payer: Self-pay

## 2018-12-20 VITALS — BP 122/70

## 2018-12-20 DIAGNOSIS — N950 Postmenopausal bleeding: Secondary | ICD-10-CM | POA: Diagnosis not present

## 2018-12-20 DIAGNOSIS — N813 Complete uterovaginal prolapse: Secondary | ICD-10-CM

## 2018-12-20 NOTE — Progress Notes (Signed)
    Sheila Anderson 1940-09-19 [id number]        78 y.o.  G2P2L2  RP: Complete uterine prolapse for pessary fitting  HPI: Complete uterine prolapse with eversion associated with irritation and some spotting.  Will try management with pessary.   OB History  Gravida Para Term Preterm AB Living  2 2       2   SAB TAB Ectopic Multiple Live Births               # Outcome Date GA Lbr Len/2nd Weight Sex Delivery Anes PTL Lv  2 Para           1 Para             Past medical history,surgical history, problem list, medications, allergies, family history and social history were all reviewed and documented in the EPIC chart.   Directed ROS with pertinent positives and negatives documented in the history of present illness/assessment and plan.  Exam:  Vitals:   12/20/18 1509  BP: 122/70   General appearance:  Normal   Gynecologic exam: Vulva normal.  Cervix with uterus seen protruding about 3 inches out.  Bimanual exam: Uterus normal volume and no adnexal mass.  Milex ring with support of different sizes tried, Milex Ring #6 with support was the best fit.  CT scan of abdo-pelvis 12/2018:  Complete uterine prolapse.  No adnexal mass/cyst.   Assessment/Plan:  78 y.o. G2P2   1. Complete uterine prolapse with prolapse of anterior vaginal wall Complete uterine prolapse with eversion and irritation causing some light bleeding.  Decision to proceed with pessary management.  Milex pessary fitted today.  Best fit was the Milex ring #6 with support.  We will order the pessary and patient will come back for insertion.  2. Postmenopausal bleeding Probably associated with irritation from complete uterine prolapse with eversion.  Counseling on above issues and coordination of care more than 50% for 25 minutes.  [name] MD, 3:24 PM 12/20/2018

## 2018-12-23 ENCOUNTER — Other Ambulatory Visit: Payer: Self-pay

## 2018-12-23 ENCOUNTER — Encounter: Payer: Self-pay | Admitting: Obstetrics & Gynecology

## 2018-12-23 ENCOUNTER — Ambulatory Visit: Payer: Medicare Other | Admitting: Obstetrics & Gynecology

## 2018-12-23 VITALS — BP 116/72

## 2018-12-23 DIAGNOSIS — R300 Dysuria: Secondary | ICD-10-CM | POA: Diagnosis not present

## 2018-12-23 DIAGNOSIS — N813 Complete uterovaginal prolapse: Secondary | ICD-10-CM

## 2018-12-23 NOTE — Progress Notes (Signed)
    Sheila Anderson [birth year] [id number]        78 y.o.  G2P2L2  RP:  Pessary insertion/Dysuria  HPI:  Complete Uterine Prolapse.  Pessary Milex ring #6 with support fitted at last visit.  Patient complains of burning with miction.  No urinary frequency.  No blood in urine.  No fever.   OB History  Gravida Para Term Preterm AB Living  2 2       2   SAB TAB Ectopic Multiple Live Births               # Outcome Date GA Lbr Len/2nd Weight Sex Delivery Anes PTL Lv  2 Para           1 Para             Past medical history,surgical history, problem list, medications, allergies, family history and social history were all reviewed and documented in the EPIC chart.   Directed ROS with pertinent positives and negatives documented in the history of present illness/assessment and plan.  Exam:  Vitals:   12/23/18 1506  BP: 116/72   General appearance:  Normal  Abdomen: Normal  Gynecologic exam: Vulva normal.  Easy insertion of Milex ring #6 with support.  U/A completely negative   Assessment/Plan:  78 y.o. G2P2L2   1. Complete uterine prolapse with prolapse of anterior vaginal wall Good reduction in uterine prolapse and anterior vaginal wall prolapse with Milex ring #6 with support.  Good fit and comfortable to patient.  Precautions reviewed with patient.  We will follow-up for a pelvic ultrasound due to postmenopausal bleeding next week and will review the pessary fitting at the same time.  We will also see with patient if she is able to remove it and put it back in place without assistance.  2. Dysuria Burning with urination.  Urine analysis completely negative.  Patient reassured.  Recommend increased water intake. - Urinalysis,Complete w/RFL Culture  Counseling on above issues and coordination of care more than 50% for 15 minutes.  [name] MD, 3:21 PM 12/23/2018

## 2018-12-23 NOTE — Patient Instructions (Signed)
1. Complete uterine prolapse with prolapse of anterior vaginal wall Good reduction in uterine prolapse and anterior vaginal wall prolapse with Milex ring #6 with support.  Good fit and comfortable to patient.  Precautions reviewed with patient.  We will follow-up for a pelvic ultrasound due to postmenopausal bleeding next week and will review the pessary fitting at the same time.  We will also see with patient if she is able to remove it and put it back in place without assistance.  2. Dysuria Burning with urination.  Urine analysis completely negative.  Patient reassured.  Recommend increased water intake. - Urinalysis,Complete w/RFL Culture  Jamie-Lee, it was a pleasure seeing you today!

## 2018-12-25 LAB — URINALYSIS, COMPLETE W/RFL CULTURE
Bacteria, UA: NONE SEEN /HPF
Bilirubin Urine: NEGATIVE
Glucose, UA: NEGATIVE
Hgb urine dipstick: NEGATIVE
Hyaline Cast: NONE SEEN /LPF
Ketones, ur: NEGATIVE
Leukocyte Esterase: NEGATIVE
Nitrites, Initial: NEGATIVE
Protein, ur: NEGATIVE
RBC / HPF: NONE SEEN /HPF (ref 0–2)
Specific Gravity, Urine: 1.02 (ref 1.001–1.03)
WBC, UA: NONE SEEN /HPF (ref 0–5)
pH: 5 (ref 5.0–8.0)

## 2018-12-25 LAB — NO CULTURE INDICATED

## 2018-12-27 ENCOUNTER — Encounter: Payer: Self-pay | Admitting: Obstetrics & Gynecology

## 2018-12-27 NOTE — Patient Instructions (Signed)
1. Complete uterine prolapse with prolapse of anterior vaginal wall Complete uterine prolapse with eversion and irritation causing some light bleeding.  Decision to proceed with pessary management.  Milex pessary fitted today.  Best fit was the Milex ring #6 with support.  We will order the pessary and patient will come back for insertion.  2. Postmenopausal bleeding Probably associated with irritation from complete uterine prolapse with eversion.  Sheila Anderson, it was a pleasure seeing you today!

## 2018-12-30 ENCOUNTER — Other Ambulatory Visit: Payer: Self-pay | Admitting: Obstetrics & Gynecology

## 2018-12-30 ENCOUNTER — Other Ambulatory Visit: Payer: Self-pay

## 2018-12-30 ENCOUNTER — Ambulatory Visit (INDEPENDENT_AMBULATORY_CARE_PROVIDER_SITE_OTHER): Payer: Medicare Other

## 2018-12-30 ENCOUNTER — Ambulatory Visit: Payer: Medicare Other | Admitting: Obstetrics & Gynecology

## 2018-12-30 DIAGNOSIS — N950 Postmenopausal bleeding: Secondary | ICD-10-CM

## 2018-12-30 DIAGNOSIS — N814 Uterovaginal prolapse, unspecified: Secondary | ICD-10-CM | POA: Diagnosis not present

## 2018-12-30 DIAGNOSIS — Z4689 Encounter for fitting and adjustment of other specified devices: Secondary | ICD-10-CM

## 2018-12-30 NOTE — Progress Notes (Signed)
    Sheila Anderson Feb 16, 1941 [id number]        77 y.o.  G2P2L2  RP: PMB for Pelvic US  HPI: Complete uterine prolapse with eversion, well with Milex ring #6 pessary.  No longer having PMB x pessary inserted.  No pelvic pain.  Normal vaginal secretions.  Minimal SUI, only if sneezing or coughing.   OB History  Gravida Para Term Preterm AB Living  2 2       2   SAB TAB Ectopic Multiple Live Births               # Outcome Date GA Lbr Len/2nd Weight Sex Delivery Anes PTL Lv  2 Para           1 Para             Past medical history,surgical history, problem list, medications, allergies, family history and social history were all reviewed and documented in the EPIC chart.   Directed ROS with pertinent positives and negatives documented in the history of present illness/assessment and plan.  Exam:  There were no vitals filed for this visit. General appearance:  Normal  Pelvic US today: T/V images.  Retroverted heterogeneous uterus measuring 2.08 x 3.5 x 2.23 cm.  Endometrial line thin, measured at 4.4 mm.  Endometrial cystic area measuring 3 mm at the fundus with negative color flow doppler.  Right and left ovaries atrophic with numerous micro calcifications.  Left ovary with a 4 mm echofree cyst.  No free fluid in the posterior cul-de-sac.   Assessment/Plan:  78 y.o. G2P2   1. Postmenopausal bleeding Resolved postmenopausal bleeding since pessary insertion.  Pelvic ultrasound findings reviewed with patient.  Retroverted uterus of small volume.  Endometrial lining is thin at 4.4 mm but an endometrial cystic area is present measuring 3 mm at the fundus with negative color flow Doppler.  Right and left ovaries normal with a 4 mm echo-free cyst on the left ovary.  No free fluid in the posterior to the sac.  Given the benign appearance of the small endometrial cystic area, decision made to repeat a pelvic ultrasound in 3 months.  If the small cystic area persists or becomes larger, and  endometrial biopsy will be done at that time.  The 4 mm echo-free cyst on the left ovary will also be reassessed. - US Transvaginal Non-OB; Future, in 3 months  2. Pessary maintenance Patient doing very well with Milex ring #6 with support.  Patient is able to remove the pessary cleaning it and put it back in place herself.  Counseling on above issues and coordination of care more than 50% for 15 minutes.  [name] MD, 3:36 PM 12/30/2018

## 2018-12-31 ENCOUNTER — Encounter: Payer: Self-pay | Admitting: Obstetrics & Gynecology

## 2018-12-31 NOTE — Patient Instructions (Signed)
1. Postmenopausal bleeding Resolved postmenopausal bleeding since pessary insertion.  Pelvic ultrasound findings reviewed with patient.  Retroverted uterus of small volume.  Endometrial lining is thin at 4.4 mm but an endometrial cystic area is present measuring 3 mm at the fundus with negative color flow Doppler.  Right and left ovaries normal with a 4 mm echo-free cyst on the left ovary.  No free fluid in the posterior to the sac.  Given the benign appearance of the small endometrial cystic area, decision made to repeat a pelvic ultrasound in 3 months.  If the small cystic area persists or becomes larger, and endometrial biopsy will be done at that time.  The 4 mm echo-free cyst on the left ovary will also be reassessed. - US Transvaginal Non-OB; Future, in 3 months  2. Pessary maintenance Patient doing very well with Milex ring #6 with support.  Patient is able to remove the pessary cleaning it and put it back in place herself.  Sheila Anderson, it was a pleasure seeing you today!

## 2019-01-01 LAB — HM COLONOSCOPY

## 2019-01-07 ENCOUNTER — Telehealth: Payer: Self-pay | Admitting: *Deleted

## 2019-01-07 ENCOUNTER — Other Ambulatory Visit: Payer: Self-pay | Admitting: Gastroenterology

## 2019-01-07 NOTE — Telephone Encounter (Signed)
Schedule AWV.  

## 2019-01-09 ENCOUNTER — Encounter: Payer: Self-pay | Admitting: *Deleted

## 2019-01-14 ENCOUNTER — Other Ambulatory Visit (HOSPITAL_COMMUNITY)
Admission: RE | Admit: 2019-01-14 | Discharge: 2019-01-14 | Disposition: A | Discharge status: Home / Self Care | Payer: Medicare Other | Source: Ambulatory Visit | Attending: Gastroenterology | Admitting: Gastroenterology

## 2019-01-14 DIAGNOSIS — Z01812 Encounter for preprocedural laboratory examination: Secondary | ICD-10-CM | POA: Diagnosis present

## 2019-01-14 DIAGNOSIS — K317 Polyp of stomach and duodenum: Secondary | ICD-10-CM | POA: Insufficient documentation

## 2019-01-14 DIAGNOSIS — Z20828 Contact with and (suspected) exposure to other viral communicable diseases: Secondary | ICD-10-CM | POA: Diagnosis not present

## 2019-01-14 LAB — SARS CORONAVIRUS 2 (TAT 6-24 HRS): SARS Coronavirus 2: NEGATIVE

## 2019-01-16 NOTE — Progress Notes (Signed)
Talked with patient regarding appointment tomorrow at 0900. Pt will arrive at 0730. Pt stated that she has been quarantined and does not have any symptoms or has been around anyone who is sick.

## 2019-01-17 ENCOUNTER — Ambulatory Visit (HOSPITAL_COMMUNITY): Payer: Medicare Other | Admitting: Registered Nurse

## 2019-01-17 ENCOUNTER — Encounter (HOSPITAL_COMMUNITY)
Admission: RE | Disposition: A | Discharge status: Home / Self Care | Payer: Self-pay | Source: Home / Self Care | Attending: Gastroenterology

## 2019-01-17 ENCOUNTER — Encounter (HOSPITAL_COMMUNITY): Payer: Self-pay | Admitting: *Deleted

## 2019-01-17 ENCOUNTER — Ambulatory Visit (HOSPITAL_COMMUNITY)
Admission: RE | Admit: 2019-01-17 | Discharge: 2019-01-17 | Disposition: A | Discharge status: Home / Self Care | Payer: Medicare Other | Attending: Gastroenterology | Admitting: Gastroenterology

## 2019-01-17 DIAGNOSIS — Z888 Allergy status to other drugs, medicaments and biological substances status: Secondary | ICD-10-CM | POA: Insufficient documentation

## 2019-01-17 DIAGNOSIS — D132 Benign neoplasm of duodenum: Secondary | ICD-10-CM | POA: Insufficient documentation

## 2019-01-17 DIAGNOSIS — K219 Gastro-esophageal reflux disease without esophagitis: Secondary | ICD-10-CM | POA: Diagnosis not present

## 2019-01-17 DIAGNOSIS — K449 Diaphragmatic hernia without obstruction or gangrene: Secondary | ICD-10-CM | POA: Insufficient documentation

## 2019-01-17 DIAGNOSIS — F419 Anxiety disorder, unspecified: Secondary | ICD-10-CM | POA: Insufficient documentation

## 2019-01-17 DIAGNOSIS — Z96611 Presence of right artificial shoulder joint: Secondary | ICD-10-CM | POA: Insufficient documentation

## 2019-01-17 HISTORY — PX: ESOPHAGOGASTRODUODENOSCOPY (EGD) WITH PROPOFOL: SHX5813

## 2019-01-17 HISTORY — PX: POLYPECTOMY: SHX5525

## 2019-01-17 HISTORY — PX: SUBMUCOSAL LIFTING INJECTION: SHX6855

## 2019-01-17 HISTORY — PX: HEMOSTASIS CLIP PLACEMENT: SHX6857

## 2019-01-17 SURGERY — ESOPHAGOGASTRODUODENOSCOPY (EGD) WITH PROPOFOL
Anesthesia: Monitor Anesthesia Care

## 2019-01-17 MED ORDER — GLUCAGON HCL RDNA (DIAGNOSTIC) 1 MG IJ SOLR
INTRAMUSCULAR | Status: AC
  Filled 2019-01-17: qty 1

## 2019-01-17 MED ORDER — GLYCOPYRROLATE 0.2 MG/ML IJ SOLN
INTRAMUSCULAR | Status: DC | PRN
  Administered 2019-01-17: 0.2 mg via INTRAVENOUS

## 2019-01-17 MED ORDER — GLUCAGON HCL RDNA (DIAGNOSTIC) 1 MG IJ SOLR
INTRAMUSCULAR | Status: DC | PRN
  Administered 2019-01-17 (×3): .5 mg via INTRAVENOUS

## 2019-01-17 MED ORDER — LACTATED RINGERS IV SOLN
INTRAVENOUS | Status: DC
  Administered 2019-01-17 (×2): via INTRAVENOUS

## 2019-01-17 MED ORDER — PROPOFOL 10 MG/ML IV BOLUS
INTRAVENOUS | Status: AC
  Filled 2019-01-17: qty 40

## 2019-01-17 MED ORDER — PROPOFOL 500 MG/50ML IV EMUL
INTRAVENOUS | Status: DC | PRN
  Administered 2019-01-17: 300 ug/kg/min via INTRAVENOUS

## 2019-01-17 MED ORDER — SODIUM CHLORIDE 0.9 % IV SOLN: INTRAVENOUS | Status: DC

## 2019-01-17 MED ORDER — PROPOFOL 10 MG/ML IV BOLUS
INTRAVENOUS | Status: AC
  Filled 2019-01-17: qty 20

## 2019-01-17 MED ORDER — PHENYLEPHRINE HCL (PRESSORS) 10 MG/ML IV SOLN
INTRAVENOUS | Status: DC | PRN
  Administered 2019-01-17: 120 ug via INTRAVENOUS
  Administered 2019-01-17 (×4): 80 ug via INTRAVENOUS

## 2019-01-17 MED ORDER — FENTANYL CITRATE (PF) 100 MCG/2ML IJ SOLN
INTRAMUSCULAR | Status: AC
  Filled 2019-01-17: qty 2

## 2019-01-17 MED ORDER — EPHEDRINE SULFATE 50 MG/ML IJ SOLN
INTRAMUSCULAR | Status: DC | PRN
  Administered 2019-01-17: 10 mg via INTRAVENOUS

## 2019-01-17 SURGICAL SUPPLY — 15 items

## 2019-01-17 NOTE — Op Note (Signed)
[facility] Patient Name: Sheila Anderson Procedure Date: 01/17/2019 MRN: [id number] Attending MD: Carol Ada , MD Date of Birth: 1941-03-24 CSN: [id number] Age: 78 Admit Type: Outpatient Procedure:                Upper GI endoscopy Indications:              Therapeutic procedure Providers:                Carol Ada, MD, Vista Lawman, RN, Marguerita Merles, Technician Referring MD:              Medicines:                Propofol per Anesthesia Complications:            No immediate complications. Estimated Blood Loss:     Estimated blood loss: none. Procedure:                Pre-Anesthesia Assessment:                           - Prior to the procedure, a History and Physical                            was performed, and patient medications and                            allergies were reviewed. The patient's tolerance of                            previous anesthesia was also reviewed. The risks                            and benefits of the procedure and the sedation                            options and risks were discussed with the patient.                            All questions were answered, and informed consent                            was obtained. Prior Anticoagulants: The patient has                            taken no previous anticoagulant or antiplatelet                            agents. ASA Grade Assessment: II - A patient with                            mild systemic disease. After reviewing the risks                            and  benefits, the patient was deemed in                            satisfactory condition to undergo the procedure.                           - Sedation was administered by an anesthesia                            professional. Deep sedation was attained.                           After obtaining informed consent, the endoscope was                            passed under direct vision. Throughout  the                            procedure, the patient's blood pressure, pulse, and                            oxygen saturations were monitored continuously. The                            GIF-H190 ([id number] Olympus gastroscope was                            introduced through the mouth, and advanced to the                            second part of duodenum. The upper GI endoscopy was                            technically difficult and complex. The patient                            tolerated the procedure well. Scope In: Scope Out: Findings:      A 3 cm hiatal hernia was present.      The stomach was normal.      A single 20 mm sessile polyp with no bleeding was found in the second       portion of the duodenum. Polypectomy was attempted, initially using a       hot snare. Polyp resection was incomplete with this device. This       intervention then required a different device and polypectomy technique.       The polyp was removed with a saline injection-lift technique using a hot       snare. Resection and retrieval were complete. To prevent bleeding       post-intervention, six hemostatic clips were successfully placed (MR       unsafe). There was no bleeding at the end of the procedure.      The duodenal polyp was identified. Eleven ml of Orise was injected and       the polyp was adequately lifted, however this made it difficult to  capture the polyp. Using a combination of flexible and stiff snares, the       polyp was successfully removed with flexible and stiff snares. Some of       the edges were cauterized with the tip of the snare and then the mucosal       defect was closed with 6 hemoclips. Impression:               - 3 cm hiatal hernia.                           - Normal stomach.                           - A single duodenal polyp. Resected and retrieved.                            Clips (MR unsafe) were placed. Moderate Sedation:      Not Applicable - Patient had  care per Anesthesia. Recommendation:           - Patient has a contact number available for                            emergencies. The signs and symptoms of potential                            delayed complications were discussed with the                            patient. Return to normal activities tomorrow.                            Written discharge instructions were provided to the                            patient.                           - Resume previous diet.                           - Continue present medications.                           - Await pathology results.                           - Repeat upper endoscopy in 6 months for                            surveillance. Procedure Code(s):        --- Professional ---                           [phone], Esophagogastroduodenoscopy, flexible,                            transoral; with removal of tumor(s), polyp(s), or  other lesion(s) by snare technique                           43236, Esophagogastroduodenoscopy, flexible,                            transoral; with directed submucosal injection(s),                            any substance Diagnosis Code(s):        --- Professional ---                           K31.7, Polyp of stomach and duodenum                           K44.9, Diaphragmatic hernia without obstruction or                            gangrene CPT copyright 2019 American Medical Association. All rights reserved. The codes documented in this report are preliminary and upon coder review may  be revised to meet current compliance requirements. Carol Ada, MD Carol Ada, MD 01/17/2019 10:03:30 AM This report has been signed electronically. Number of Addenda: 0

## 2019-01-17 NOTE — Anesthesia Preprocedure Evaluation (Addendum)
Anesthesia Evaluation  Patient identified by MRN, date of birth, ID band Patient awake    Reviewed: Allergy & Precautions, NPO status , Patient's Chart, lab work & pertinent test results  History of Anesthesia Complications Negative for: history of anesthetic complications  Airway Mallampati: II  TM Distance: >3 FB Neck ROM: Full    Dental  (+) Teeth Intact   Pulmonary neg pulmonary ROS,    Pulmonary exam normal        Cardiovascular Exercise Tolerance: Good negative cardio ROS Normal cardiovascular exam     Neuro/Psych PSYCHIATRIC DISORDERS Anxiety negative neurological ROS     GI/Hepatic Neg liver ROS, hiatal hernia, GERD  Controlled,  Endo/Other  negative endocrine ROS  Renal/GU negative Renal ROS     Musculoskeletal  (+) Arthritis ,   Abdominal   Peds  Hematology negative hematology ROS (+)   Anesthesia Other Findings   Reproductive/Obstetrics                            Anesthesia Physical Anesthesia Plan  ASA: II  Anesthesia Plan: MAC   Post-op Pain Management:    Induction: Intravenous  PONV Risk Score and Plan: 2 and Propofol infusion and Treatment may vary due to age or medical condition  Airway Management Planned: Nasal Cannula and Natural Airway  Additional Equipment: None  Intra-op Plan:   Post-operative Plan:   Informed Consent: I have reviewed the patients History and Physical, chart, labs and discussed the procedure including the risks, benefits and alternatives for the proposed anesthesia with the patient or authorized representative who has indicated his/her understanding and acceptance.       Plan Discussed with: CRNA and Anesthesiologist  Anesthesia Plan Comments:        Anesthesia Quick Evaluation

## 2019-01-17 NOTE — Discharge Instructions (Signed)

## 2019-01-17 NOTE — H&P (Signed)
  Secundino Ginger Mariani HPI: During a routine EGD with Dr. Collene Mares for complaints of epigastric pain a D2 polyp was identified.  Cold biopsies confirmed that it was an adenoma.  She is here today for resection of the polyp.  Past Medical History:  Diagnosis Date  . Allergy   . Anxiety   . Anxiety   . Arthritis   . Back pain   . Cataract   . Closed L1 vertebral fracture ([place])   . GERD (gastroesophageal reflux disease)   . History of hiatal hernia   . Neck pain   . Neuromuscular disorder ([place])   . Thyroid lesion     Past Surgical History:  Procedure Laterality Date  . CATARACT EXTRACTION Left    10 yrs ago  . cyst removal from bladder    . EYE SURGERY Left   . laproscopy     poly in colon  . REVERSE SHOULDER ARTHROPLASTY Right 03/10/2016   Procedure: RIGHT REVERSE SHOULDER ARTHROPLASTY;  Surgeon: Netta Cedars, MD;  Location: [facility];  Service: Orthopedics;  Laterality: Right;  . wrinkle in retina Left     Family History  Problem Relation Age of Onset  . Heart disease Mother   . Hypertension Mother   . Stroke Mother   . Congestive Heart Failure Father   . Heart failure Father   . Multiple sclerosis Sister     Social History:  reports that she has never smoked. She has never used smokeless tobacco. She reports current alcohol use of about 1.0 standard drinks of alcohol per week. She reports that she does not use drugs.  Allergies:  Allergies  Allergen Reactions  . Robaxin [Methocarbamol] Other (See Comments)    Per patient shut down GI tract, but not sure  . Mobic [Meloxicam] Other (See Comments)    Makes her constipated   . Nickel Rash    Medications:  Scheduled:  Continuous: . lactated ringers 20 mL/hr at 01/17/19 0755    No results found for this or any previous visit (from the past 24 hour(s)).   No results found.  ROS:  As stated above in the HPI otherwise negative.  Blood pressure (!) 149/84, pulse 80, temperature 98.4 F (36.9 C), temperature source Oral,  resp. rate 14, height 5\' 8"  (1.727 m), weight 62.6 kg, SpO2 97 %.    PE: Gen: NAD, Alert and Oriented HEENT:  [place]/AT, EOMI Neck: Supple, no LAD Lungs: CTA Bilaterally CV: RRR without M/G/R ABM: Soft, NTND, +BS Ext: No C/C/E  Assessment/Plan: 1) Duodenal adenoma - EGD with polypectomy.  Takeira Yanes D 01/17/2019, 8:24 AM

## 2019-01-17 NOTE — Anesthesia Postprocedure Evaluation (Signed)
Anesthesia Post Note  Patient: Sheila Anderson  Procedure(s) Performed: ESOPHAGOGASTRODUODENOSCOPY (EGD) WITH PROPOFOL (N/A ) SUBMUCOSAL LIFTING INJECTION POLYPECTOMY HEMOSTASIS CLIP PLACEMENT     Patient location during evaluation: PACU Anesthesia Type: MAC Level of consciousness: awake and alert Pain management: pain level controlled Vital Signs Assessment: post-procedure vital signs reviewed and stable Respiratory status: spontaneous breathing, nonlabored ventilation and respiratory function stable Cardiovascular status: stable and blood pressure returned to baseline Anesthetic complications: no    Last Vitals:  Vitals:   01/17/19 1020 01/17/19 1030  BP: (!) 106/55 121/67  Pulse: 65 66  Resp: 15 16  Temp:    SpO2: 96% 99%    Last Pain:  Vitals:   01/17/19 1030  TempSrc:   PainSc: 0-No pain                 Audry Pili

## 2019-01-17 NOTE — Transfer of Care (Signed)
Immediate Anesthesia Transfer of Care Note  Patient: Sheila Anderson  Procedure(s) Performed: ESOPHAGOGASTRODUODENOSCOPY (EGD) WITH PROPOFOL (N/A ) SUBMUCOSAL LIFTING INJECTION POLYPECTOMY HEMOSTASIS CLIP PLACEMENT  Patient Location: PACU  Anesthesia Type:MAC  Level of Consciousness: awake, alert , oriented and patient cooperative  Airway & Oxygen Therapy: Patient Spontanous Breathing and Patient connected to nasal cannula oxygen  Post-op Assessment: Report given to RN, Post -op Vital signs reviewed and stable and Patient moving all extremities X 4  Post vital signs: stable  Last Vitals:  Vitals Value Taken Time  BP 82/38 01/17/19 1002  Temp 36.2 C 01/17/19 1001  Pulse 64 01/17/19 1004  Resp 17 01/17/19 1004  SpO2 97 % 01/17/19 1004  Vitals shown include unvalidated device data.  Last Pain:  Vitals:   01/17/19 1001  TempSrc: Axillary  PainSc: Asleep         Complications: No apparent anesthesia complications  BP low and given ephedrine in PACU and trendlenberg and fluids open

## 2019-01-17 NOTE — Anesthesia Procedure Notes (Signed)
Procedure Name: MAC Date/Time: 01/17/2019 8:40 AM Performed by: Lissa Morales, CRNA Pre-anesthesia Checklist: Patient identified, Emergency Drugs available, Suction available, Patient being monitored and Timeout performed Patient Re-evaluated:Patient Re-evaluated prior to induction Oxygen Delivery Method: Nasal cannula Placement Confirmation: positive ETCO2 Dental Injury: Teeth and Oropharynx as per pre-operative assessment

## 2019-01-20 ENCOUNTER — Encounter (HOSPITAL_COMMUNITY): Payer: Self-pay | Admitting: Gastroenterology

## 2019-02-25 ENCOUNTER — Other Ambulatory Visit: Payer: Self-pay

## 2019-02-25 ENCOUNTER — Ambulatory Visit (INDEPENDENT_AMBULATORY_CARE_PROVIDER_SITE_OTHER): Payer: Medicare Other | Admitting: Emergency Medicine

## 2019-02-25 ENCOUNTER — Encounter: Payer: Self-pay | Admitting: Emergency Medicine

## 2019-02-25 VITALS — BP 116/73 | HR 77 | Temp 98.3°F | Resp 16 | Ht 68.0 in | Wt 139.8 lb

## 2019-02-25 DIAGNOSIS — T07XXXA Unspecified multiple injuries, initial encounter: Secondary | ICD-10-CM | POA: Diagnosis not present

## 2019-02-25 DIAGNOSIS — W19XXXA Unspecified fall, initial encounter: Secondary | ICD-10-CM

## 2019-02-25 DIAGNOSIS — Z23 Encounter for immunization: Secondary | ICD-10-CM

## 2019-02-25 DIAGNOSIS — R21 Rash and other nonspecific skin eruption: Secondary | ICD-10-CM

## 2019-02-25 NOTE — Patient Instructions (Addendum)
If you have lab work done today you will be contacted with your lab results within the next 2 weeks.  If you have not heard from [place] then please contact us. The fastest way to get your results is to register for My Chart.   IF you received an x-ray today, you will receive an invoice from [facility]. Please contact [facility] at [phone] with questions or concerns regarding your invoice.   IF you received labwork today, you will receive an invoice from [facility]. Please contact [facility] at [phone] with questions or concerns regarding your invoice.   Our billing staff will not be able to assist you with questions regarding bills from these companies.  You will be contacted with the lab results as soon as they are available. The fastest way to get your results is to activate your My Chart account. Instructions are located on the last page of this paperwork. If you have not heard from [place] regarding the results in 2 weeks, please contact this office.     Contusion A contusion is a deep bruise. This is a result of an injury that causes bleeding under the skin. Symptoms of bruising include pain, swelling, and discolored skin. The skin may turn blue, purple, or yellow. Follow these instructions at home: Managing pain, stiffness, and swelling You may use RICE. This stands for:  Resting.  Icing.  Compression, or putting pressure.  Elevating, or raising the injured area. To follow this method, do these actions:  Rest the injured area.  If told, put ice on the injured area. ? Put ice in a plastic bag. ? Place a towel between your skin and the bag. ? Leave the ice on for 20 minutes, 2-3 times per day.  If told, put light pressure (compression) on the injured area using an elastic bandage. Make sure the bandage is not too tight. If the area tingles or becomes numb, remove it and put it back on as told by your doctor.  If possible, raise (elevate) the injured  area above the level of your heart while you are sitting or lying down.  General instructions  Take over-the-counter and prescription medicines only as told by your doctor.  Keep all follow-up visits as told by your doctor. This is important. Contact a doctor if:  Your symptoms do not get better after several days of treatment.  Your symptoms get worse.  You have trouble moving the injured area. Get help right away if:  You have very bad pain.  You have a loss of feeling (numbness) in a hand or foot.  Your hand or foot turns pale or cold. Summary  A contusion is a deep bruise. This is a result of an injury that causes bleeding under the skin.  Symptoms of bruising include pain, swelling, and discolored skin. The skin may turn blue, purple, or yellow.  This condition is treated with rest, ice, compression, and elevation. This is also called RICE. You may be given over-the-counter medicines for pain.  Contact a doctor if you do not feel better, or you feel worse. Get help right away if you have very bad pain, have lost feeling in a hand or foot, or the area turns pale or cold. This information is not intended to replace advice given to you by your health care provider. Make sure you discuss any questions you have with your health care provider. Document Released: 11/15/2007 Document Revised: 01/18/2018 Document Reviewed: 01/18/2018 Elsevier Patient Education  2020 [employer]  
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
## 2019-04-03 ENCOUNTER — Ambulatory Visit (INDEPENDENT_AMBULATORY_CARE_PROVIDER_SITE_OTHER): Payer: Medicare Other

## 2019-04-03 ENCOUNTER — Other Ambulatory Visit: Payer: Self-pay

## 2019-04-03 ENCOUNTER — Ambulatory Visit: Payer: Medicare Other | Admitting: Obstetrics & Gynecology

## 2019-04-03 DIAGNOSIS — R21 Rash and other nonspecific skin eruption: Secondary | ICD-10-CM | POA: Diagnosis not present

## 2019-04-03 DIAGNOSIS — N950 Postmenopausal bleeding: Secondary | ICD-10-CM | POA: Diagnosis not present

## 2019-04-03 DIAGNOSIS — N840 Polyp of corpus uteri: Secondary | ICD-10-CM | POA: Diagnosis not present

## 2019-04-03 NOTE — Progress Notes (Addendum)
    Sheila Anderson 04/13/1941 [id number]        78 y.o.  G2P2L2   RP: PMB with thickened endometrium for repeat Pelvic US  HPI: No PMB x last Pelvic US 12/2018.  No pelvic pain.     OB History  Gravida Para Term Preterm AB Living  2 2       2   SAB TAB Ectopic Multiple Live Births               # Outcome Date GA Lbr Len/2nd Weight Sex Delivery Anes PTL Lv  2 Para           1 Para             Past medical history,surgical history, problem list, medications, allergies, family history and social history were all reviewed and documented in the EPIC chart.   Directed ROS with pertinent positives and negatives documented in the history of present illness/assessment and plan.  Exam:  There were no vitals filed for this visit. General appearance:  Normal  Pelvic US today: T/V images.  Comparison is made with previous scan done July 2020.  Small retroverted uterus with no myometrial mass measuring 6.91 x 3.63 x 2.08 cm.  Asymmetrical endometrium with an 8 x 7 x 6 mm echogenic mass with cystic change compatible with a polyp, similar in size and appearance to previous scan.  The endometrial lining away from that lesion is measured at 2.51 mm.  Both ovaries are small with atrophic appearance, a tiny residual simple follicle less than 1 cm is present on the left ovary, unchanged since previous scan.  No adnexal mass.  No free fluid in the posterior cul-de-sac.   Assessment/Plan:  78 y.o. G2P2   1. Postmenopausal bleeding No recent PMB, but IU lesion persists.  2. Endometrial polyp Pelvic ultrasound findings reviewed with patient.  Asymmetrical endometrium with an 8 x 7 x 6 mm echogenic mass with cystic change compatible with a polyp similar to previous scan.  Otherwise no significant findings on pelvic ultrasound.  We will proceed with hysteroscopy to excise the intrauterine lesion.  Schedule HSC Myosure excision, D+C.  Preop management, surgery with risks and benefits, as well as postop  precautions thoroughly reviewed with patient.  Pamphlet on hysteroscopy given.  3. Vulvar rash Clobetasol daily until surgery.                        Patient was counseled as to the risk of surgery to include the following:  1. Infection (prohylactic antibiotics will be administered)  2. DVT/Pulmonary Embolism (prophylactic pneumo compression stockings will be used)  3.Trauma to internal organs requiring additional surgical procedure to repair any injury to internal organs requiring perhaps additional hospitalization days.  4.Hemmorhage requiring transfusion and blood products which carry risks such as anaphylactic reaction, hepatitis and AIDS  Patient had received literature information on the procedure scheduled and all her questions were answered and fully accepts all risk.  Counseling on above issues and coordination of care more than 50% for 15 minutes.  [name] MD, 3:12 PM 04/03/2019

## 2019-04-04 ENCOUNTER — Encounter: Payer: Self-pay | Admitting: Obstetrics & Gynecology

## 2019-04-04 NOTE — Patient Instructions (Signed)
1. Postmenopausal bleeding No recent PMB, but IU lesion persists.  2. Endometrial polyp Pelvic ultrasound findings reviewed with patient.  Asymmetrical endometrium with an 8 x 7 x 6 mm echogenic mass with cystic change compatible with a polyp similar to previous scan.  Otherwise no significant findings on pelvic ultrasound.  We will proceed with hysteroscopy to excise the intrauterine lesion.  Schedule HSC Myosure excision, D+C.  Preop management, surgery with risks and benefits, as well as postop precautions thoroughly reviewed with patient.  Pamphlet on hysteroscopy given.  3. Vulvar rash Clobetasol daily until surgery.                        Patient was counseled as to the risk of surgery to include the following:  1. Infection (prohylactic antibiotics will be administered)  2. DVT/Pulmonary Embolism (prophylactic pneumo compression stockings will be used)  3.Trauma to internal organs requiring additional surgical procedure to repair any injury to internal organs requiring perhaps additional hospitalization days.  4.Hemmorhage requiring transfusion and blood products which carry risks such as anaphylactic reaction, hepatitis and AIDS  Patient had received literature information on the procedure scheduled and all her questions were answered and fully accepts all risk.  Sheila Anderson, it was a pleasure seeing you today!

## 2019-04-07 ENCOUNTER — Telehealth: Payer: Self-pay | Admitting: *Deleted

## 2019-04-07 NOTE — Telephone Encounter (Signed)
Schedule AWV.  

## 2019-04-08 ENCOUNTER — Telehealth: Payer: Self-pay | Admitting: *Deleted

## 2019-04-09 NOTE — Telephone Encounter (Signed)
Schedule awv  

## 2019-04-18 ENCOUNTER — Telehealth: Payer: Self-pay

## 2019-04-18 NOTE — Telephone Encounter (Signed)
Encounter opened in error

## 2019-04-29 ENCOUNTER — Telehealth: Payer: Self-pay

## 2019-04-29 NOTE — Telephone Encounter (Signed)
Called patient back on 04/07/19 and left message in voice mail to call me about scheduling.  I failed to document here. I called her again today and left another voice mail message to call me about scheduling.

## 2019-04-30 ENCOUNTER — Telehealth: Payer: Self-pay

## 2019-04-30 NOTE — Telephone Encounter (Signed)
Patient called me back about scheduling surgery. She said she hasn't called me before because she still has the rash that she saw you for on 04/03/19. She said something says she cannot have surgery with rash.  You had recommended Clobetasol cream until surgery.  She said she needed to schedule another visit to come see you because rash has not resolved. Do you need to see her?

## 2019-05-01 NOTE — Telephone Encounter (Signed)
Left message to call me.

## 2019-05-01 NOTE — Telephone Encounter (Signed)
Yes, visit for rash and preop (please schedule), we can do the surgery in spite of the rash, but will try to improve it.Marland KitchenMarland Kitchen

## 2019-05-02 ENCOUNTER — Other Ambulatory Visit: Payer: Self-pay | Admitting: Obstetrics & Gynecology

## 2019-05-02 NOTE — Telephone Encounter (Signed)
When I spoke with patient today she said she has gotten the rash settled down and does not feel OV needed now.  She said it is recurrent thing and never knows when It will flare. I told her she still can have surgery but let us know if flares and she needs to come in for visit.

## 2019-05-02 NOTE — Telephone Encounter (Signed)
I scheduled her surgery for Friday Dec 18 at 8:30am at [facility].  I scheduled Covid screen and gave her instructions for that and for quarantine.  Will mail info to her.

## 2019-05-27 ENCOUNTER — Other Ambulatory Visit (HOSPITAL_COMMUNITY)
Admission: RE | Admit: 2019-05-27 | Discharge: 2019-05-27 | Disposition: A | Discharge status: Home / Self Care | Payer: Medicare Other | Source: Ambulatory Visit | Attending: Obstetrics & Gynecology | Admitting: Obstetrics & Gynecology

## 2019-05-27 DIAGNOSIS — Z20828 Contact with and (suspected) exposure to other viral communicable diseases: Secondary | ICD-10-CM | POA: Insufficient documentation

## 2019-05-27 DIAGNOSIS — Z01812 Encounter for preprocedural laboratory examination: Secondary | ICD-10-CM | POA: Insufficient documentation

## 2019-05-28 LAB — NOVEL CORONAVIRUS, NAA (HOSP ORDER, SEND-OUT TO REF LAB; TAT 18-24 HRS): SARS-CoV-2, NAA: NOT DETECTED

## 2019-05-29 ENCOUNTER — Encounter (HOSPITAL_BASED_OUTPATIENT_CLINIC_OR_DEPARTMENT_OTHER): Payer: Self-pay | Admitting: Obstetrics & Gynecology

## 2019-05-29 ENCOUNTER — Other Ambulatory Visit: Payer: Self-pay

## 2019-05-29 NOTE — Progress Notes (Signed)
Spoke w/ via phone for pre-op interview--- PT Lab needs dos---- CBC              Lab results------ no COVID test ------  05-27-2019 Arrive at -------  0645 NPO after ------  MN Medications to take morning of surgery -----  Claritin, Prilosec, Detrol w/ sips of water  Diabetic medication ----- n/a Patient Special Instructions ----- n/a Pre-Op special Istructions ----- n/a Patient verbalized understanding of instructions that were given at this phone interview. Patient denies shortness of breath, chest pain, fever, cough a this phone interview.

## 2019-05-30 ENCOUNTER — Ambulatory Visit (HOSPITAL_BASED_OUTPATIENT_CLINIC_OR_DEPARTMENT_OTHER)
Admission: RE | Admit: 2019-05-30 | Discharge: 2019-05-30 | Disposition: A | Discharge status: Home / Self Care | Payer: Medicare Other | Attending: Obstetrics & Gynecology | Admitting: Obstetrics & Gynecology

## 2019-05-30 ENCOUNTER — Ambulatory Visit (HOSPITAL_BASED_OUTPATIENT_CLINIC_OR_DEPARTMENT_OTHER): Payer: Medicare Other | Admitting: Certified Registered"

## 2019-05-30 ENCOUNTER — Encounter (HOSPITAL_BASED_OUTPATIENT_CLINIC_OR_DEPARTMENT_OTHER)
Admission: RE | Disposition: A | Discharge status: Home / Self Care | Payer: Self-pay | Source: Home / Self Care | Attending: Obstetrics & Gynecology

## 2019-05-30 ENCOUNTER — Encounter (HOSPITAL_BASED_OUTPATIENT_CLINIC_OR_DEPARTMENT_OTHER): Payer: Self-pay | Admitting: Obstetrics & Gynecology

## 2019-05-30 DIAGNOSIS — Z79899 Other long term (current) drug therapy: Secondary | ICD-10-CM | POA: Insufficient documentation

## 2019-05-30 DIAGNOSIS — G709 Myoneural disorder, unspecified: Secondary | ICD-10-CM | POA: Insufficient documentation

## 2019-05-30 DIAGNOSIS — K449 Diaphragmatic hernia without obstruction or gangrene: Secondary | ICD-10-CM | POA: Insufficient documentation

## 2019-05-30 DIAGNOSIS — Z888 Allergy status to other drugs, medicaments and biological substances status: Secondary | ICD-10-CM | POA: Diagnosis not present

## 2019-05-30 DIAGNOSIS — Z8249 Family history of ischemic heart disease and other diseases of the circulatory system: Secondary | ICD-10-CM | POA: Insufficient documentation

## 2019-05-30 DIAGNOSIS — N3281 Overactive bladder: Secondary | ICD-10-CM | POA: Diagnosis not present

## 2019-05-30 DIAGNOSIS — N950 Postmenopausal bleeding: Secondary | ICD-10-CM | POA: Insufficient documentation

## 2019-05-30 DIAGNOSIS — M1990 Unspecified osteoarthritis, unspecified site: Secondary | ICD-10-CM | POA: Insufficient documentation

## 2019-05-30 DIAGNOSIS — D259 Leiomyoma of uterus, unspecified: Secondary | ICD-10-CM | POA: Diagnosis not present

## 2019-05-30 DIAGNOSIS — Z8719 Personal history of other diseases of the digestive system: Secondary | ICD-10-CM | POA: Diagnosis not present

## 2019-05-30 DIAGNOSIS — K219 Gastro-esophageal reflux disease without esophagitis: Secondary | ICD-10-CM | POA: Diagnosis not present

## 2019-05-30 DIAGNOSIS — N840 Polyp of corpus uteri: Secondary | ICD-10-CM | POA: Insufficient documentation

## 2019-05-30 HISTORY — DX: Dermatitis, unspecified: L30.9

## 2019-05-30 HISTORY — DX: Postmenopausal bleeding: N95.0

## 2019-05-30 HISTORY — DX: Other intervertebral disc degeneration, lumbar region: M51.36

## 2019-05-30 HISTORY — DX: Solitary pulmonary nodule: R91.1

## 2019-05-30 HISTORY — DX: Personal history of other medical treatment: Z92.89

## 2019-05-30 HISTORY — PX: DILATATION & CURETTAGE/HYSTEROSCOPY WITH MYOSURE: SHX6511

## 2019-05-30 HISTORY — DX: Personal history of (healed) traumatic fracture: Z87.81

## 2019-05-30 HISTORY — DX: Personal history of colon polyps, unspecified: Z86.0100

## 2019-05-30 HISTORY — DX: Other seasonal allergic rhinitis: J30.2

## 2019-05-30 HISTORY — DX: Personal history of colonic polyps: Z86.010

## 2019-05-30 HISTORY — DX: Uterovaginal prolapse, unspecified: N81.4

## 2019-05-30 HISTORY — DX: Other intervertebral disc degeneration, lumbar region without mention of lumbar back pain or lower extremity pain: M51.369

## 2019-05-30 HISTORY — DX: Unspecified osteoarthritis, unspecified site: M19.90

## 2019-05-30 HISTORY — DX: Polyp of corpus uteri: N84.0

## 2019-05-30 HISTORY — DX: Rash and other nonspecific skin eruption: R21

## 2019-05-30 HISTORY — DX: Chronic cough: R05.3

## 2019-05-30 HISTORY — DX: Diaphragmatic hernia without obstruction or gangrene: K44.9

## 2019-05-30 HISTORY — DX: Overactive bladder: N32.81

## 2019-05-30 HISTORY — DX: Personal history of other diseases of the digestive system: Z87.19

## 2019-05-30 LAB — CBC
HCT: 42 % (ref 36.0–46.0)
Hemoglobin: 13.8 g/dL (ref 12.0–15.0)
MCH: 32.7 pg (ref 26.0–34.0)
MCHC: 32.9 g/dL (ref 30.0–36.0)
MCV: 99.5 fL (ref 80.0–100.0)
Platelets: 236 10*3/uL (ref 150–400)
RBC: 4.22 MIL/uL (ref 3.87–5.11)
RDW: 12.8 % (ref 11.5–15.5)
WBC: 4.8 10*3/uL (ref 4.0–10.5)
nRBC: 0 % (ref 0.0–0.2)

## 2019-05-30 SURGERY — DILATATION & CURETTAGE/HYSTEROSCOPY WITH MYOSURE
Anesthesia: General | Site: Vagina

## 2019-05-30 MED ORDER — PROPOFOL 10 MG/ML IV BOLUS
INTRAVENOUS | Status: AC
  Filled 2019-05-30: qty 20

## 2019-05-30 MED ORDER — OXYCODONE HCL 5 MG/5ML PO SOLN
5.0000 mg | Freq: Once | ORAL | Status: DC | PRN
  Filled 2019-05-30: qty 5

## 2019-05-30 MED ORDER — LIDOCAINE 2% (20 MG/ML) 5 ML SYRINGE
INTRAMUSCULAR | Status: DC | PRN
  Administered 2019-05-30: 80 mg via INTRAVENOUS

## 2019-05-30 MED ORDER — FENTANYL CITRATE (PF) 100 MCG/2ML IJ SOLN
25.0000 ug | INTRAMUSCULAR | Status: DC | PRN
  Filled 2019-05-30: qty 1

## 2019-05-30 MED ORDER — LIDOCAINE 2% (20 MG/ML) 5 ML SYRINGE
INTRAMUSCULAR | Status: AC
  Filled 2019-05-30: qty 5

## 2019-05-30 MED ORDER — FENTANYL CITRATE (PF) 100 MCG/2ML IJ SOLN
INTRAMUSCULAR | Status: AC
  Filled 2019-05-30: qty 2

## 2019-05-30 MED ORDER — ACETAMINOPHEN 500 MG PO TABS
1000.0000 mg | ORAL_TABLET | Freq: Once | ORAL | Status: DC | PRN
  Filled 2019-05-30: qty 2

## 2019-05-30 MED ORDER — SODIUM CHLORIDE 0.9 % IR SOLN
Status: DC | PRN
  Administered 2019-05-30: 3000 mL

## 2019-05-30 MED ORDER — ACETAMINOPHEN 160 MG/5ML PO SOLN
1000.0000 mg | Freq: Once | ORAL | Status: DC | PRN
  Filled 2019-05-30: qty 40.6

## 2019-05-30 MED ORDER — DEXAMETHASONE SODIUM PHOSPHATE 10 MG/ML IJ SOLN
INTRAMUSCULAR | Status: AC
  Filled 2019-05-30: qty 1

## 2019-05-30 MED ORDER — FENTANYL CITRATE (PF) 100 MCG/2ML IJ SOLN
INTRAMUSCULAR | Status: DC | PRN
  Administered 2019-05-30 (×2): 50 ug via INTRAVENOUS

## 2019-05-30 MED ORDER — PROPOFOL 10 MG/ML IV BOLUS
INTRAVENOUS | Status: DC | PRN
  Administered 2019-05-30: 160 mg via INTRAVENOUS

## 2019-05-30 MED ORDER — ACETAMINOPHEN 10 MG/ML IV SOLN
1000.0000 mg | Freq: Once | INTRAVENOUS | Status: DC | PRN
  Filled 2019-05-30: qty 100

## 2019-05-30 MED ORDER — DEXAMETHASONE SODIUM PHOSPHATE 10 MG/ML IJ SOLN
INTRAMUSCULAR | Status: DC | PRN
  Administered 2019-05-30: 10 mg via INTRAVENOUS

## 2019-05-30 MED ORDER — LIDOCAINE HCL 1 % IJ SOLN
INTRAMUSCULAR | Status: DC | PRN
  Administered 2019-05-30: 20 mL

## 2019-05-30 MED ORDER — CEFAZOLIN SODIUM-DEXTROSE 2-4 GM/100ML-% IV SOLN
INTRAVENOUS | Status: AC
  Filled 2019-05-30: qty 100

## 2019-05-30 MED ORDER — EPHEDRINE 5 MG/ML INJ
INTRAVENOUS | Status: AC
  Filled 2019-05-30: qty 10

## 2019-05-30 MED ORDER — CEFAZOLIN SODIUM-DEXTROSE 2-4 GM/100ML-% IV SOLN
2.0000 g | INTRAVENOUS | Status: AC
  Administered 2019-05-30: 2 g via INTRAVENOUS
  Filled 2019-05-30: qty 100

## 2019-05-30 MED ORDER — ONDANSETRON HCL 4 MG/2ML IJ SOLN
INTRAMUSCULAR | Status: DC | PRN
  Administered 2019-05-30: 4 mg via INTRAVENOUS

## 2019-05-30 MED ORDER — EPHEDRINE SULFATE-NACL 50-0.9 MG/10ML-% IV SOSY
PREFILLED_SYRINGE | INTRAVENOUS | Status: DC | PRN
  Administered 2019-05-30 (×3): 10 mg via INTRAVENOUS

## 2019-05-30 MED ORDER — LACTATED RINGERS IV SOLN
INTRAVENOUS | Status: DC
  Filled 2019-05-30: qty 1000

## 2019-05-30 MED ORDER — OXYCODONE HCL 5 MG PO TABS
5.0000 mg | ORAL_TABLET | Freq: Once | ORAL | Status: DC | PRN
  Filled 2019-05-30: qty 1

## 2019-05-30 MED ORDER — ONDANSETRON HCL 4 MG/2ML IJ SOLN
INTRAMUSCULAR | Status: AC
  Filled 2019-05-30: qty 2

## 2019-05-30 SURGICAL SUPPLY — 24 items
CANISTER SUCT 3000ML PPV (MISCELLANEOUS) ×3 IMPLANT
CATH ROBINSON RED A/P 16FR (CATHETERS) ×3 IMPLANT
COVER WAND RF STERILE (DRAPES) ×3 IMPLANT
DEVICE MYOSURE LITE (MISCELLANEOUS) IMPLANT
DEVICE MYOSURE REACH (MISCELLANEOUS) ×2 IMPLANT
DILATOR CANAL MILEX (MISCELLANEOUS) IMPLANT
ELECT REM PT RETURN 9FT ADLT (ELECTROSURGICAL)
ELECTRODE REM PT RTRN 9FT ADLT (ELECTROSURGICAL) IMPLANT
GAUZE 4X4 16PLY RFD (DISPOSABLE) ×3 IMPLANT
GLOVE BIO SURGEON STRL SZ 6.5 (GLOVE) ×2 IMPLANT
GLOVE BIO SURGEONS STRL SZ 6.5 (GLOVE) ×1
GLOVE BIOGEL PI IND STRL 7.0 (GLOVE) ×2 IMPLANT
GLOVE BIOGEL PI INDICATOR 7.0 (GLOVE) ×4
GOWN STRL REUS W/TWL LRG LVL3 (GOWN DISPOSABLE) ×6 IMPLANT
IV NS IRRIG 3000ML ARTHROMATIC (IV SOLUTION) ×4 IMPLANT
KIT PROCEDURE FLUENT (KITS) ×3 IMPLANT
MYOSURE XL FIBROID (MISCELLANEOUS)
PACK VAGINAL MINOR WOMEN LF (CUSTOM PROCEDURE TRAY) ×3 IMPLANT
PAD OB MATERNITY 4.3X12.25 (PERSONAL CARE ITEMS) ×3 IMPLANT
PAD PREP 24X48 CUFFED NSTRL (MISCELLANEOUS) ×3 IMPLANT
SEAL CERVICAL OMNI LOK (ABLATOR) IMPLANT
SEAL ROD LENS SCOPE MYOSURE (ABLATOR) ×3 IMPLANT
SYSTEM TISS REMOVAL MYOSURE XL (MISCELLANEOUS) IMPLANT
TOWEL OR 17X26 10 PK STRL BLUE (TOWEL DISPOSABLE) ×4 IMPLANT

## 2019-05-30 NOTE — H&P (Signed)
Sheila Anderson is an 78 y.o. female. G2P2L2   RP: PMB with thickened Endometrium/Endometrial Polyp for HSC/Myosure Excision/D+C  HPI: No PMB x last Pelvic US 12/2018.  No pelvic pain.    Menstrual History: No LMP recorded. Patient is postmenopausal.    Past Medical History:  Diagnosis Date  . Anxiety   . Chronic cough    05-29-2019  per pt due to allergies and reflux  . Cystocele with uterine prolapse   . DDD (degenerative disc disease), lumbar   . Eczema   . Endometrial polyp   . GERD (gastroesophageal reflux disease)   . Hiatal hernia   . History of colon polyps   . History of echocardiogram    03-26-2015   ef 60-65%  . History of exercise stress test    03-26-2015 in epic ,  unable to rule out ischemia due to poor exercise  . History of gastric polyp    01-17-2019   duodenol polyp (low grade hyperplasia)  . History of vertebral compression fracture    2017 -- L1  . OA (osteoarthritis)   . OAB (overactive bladder)   . PMB (postmenopausal bleeding)   . Pulmonary nodule    05-29-2019 was told seen in CT imaging 12-11-2018  . Rash    eyelids  . Seasonal allergies     Past Surgical History:  Procedure Laterality Date  . CATARACT EXTRACTION W/ INTRAOCULAR LENS  IMPLANT, BILATERAL  2015  approx.  . CYSTOSCOPY  2010   approx.   w/ removal bladder cyst  (per pt benign)  . ESOPHAGOGASTRODUODENOSCOPY (EGD) WITH PROPOFOL N/A 01/17/2019   Procedure: ESOPHAGOGASTRODUODENOSCOPY (EGD) WITH PROPOFOL;  Surgeon: Carol Ada, MD;  Location: [facility];  Service: Endoscopy;  Laterality: N/A;  . EYE SURGERY Left 07-26-2006   @[facility]    for preretinal fibrosis  . HEMOSTASIS CLIP PLACEMENT  01/17/2019   Procedure: HEMOSTASIS CLIP PLACEMENT;  Surgeon: Carol Ada, MD;  Location: [facility];  Service: Endoscopy;;  . LAPAROSCOPIC PARTIAL RIGHT COLECTOMY  02-04-2010   dr Redmond Pulling @[facility]    large polyps  . POLYPECTOMY  01/17/2019   Procedure: POLYPECTOMY;  Surgeon: Carol Ada, MD;   Location: [facility];  Service: Endoscopy;;  . REVERSE SHOULDER ARTHROPLASTY Right 03/10/2016   Procedure: RIGHT REVERSE SHOULDER ARTHROPLASTY;  Surgeon: Netta Cedars, MD;  Location: [place];  Service: Orthopedics;  Laterality: Right;  . SUBMUCOSAL LIFTING INJECTION  01/17/2019   Procedure: SUBMUCOSAL LIFTING INJECTION;  Surgeon: Carol Ada, MD;  Location: [facility];  Service: Endoscopy;;    Family History  Problem Relation Age of Onset  . Heart disease Mother   . Hypertension Mother   . Stroke Mother   . Congestive Heart Failure Father   . Heart failure Father   . Multiple sclerosis Sister     Social History:  reports that she has never smoked. She has never used smokeless tobacco. She reports current alcohol use of about 2.0 - 3.0 standard drinks of alcohol per week. She reports that she does not use drugs.  Allergies:  Allergies  Allergen Reactions  . Robaxin [Methocarbamol] Other (See Comments)    Per patient shut down GI tract, but not sure  . Mobic [Meloxicam] Other (See Comments)    Makes her constipated   . Nickel Rash    Medications Prior to Admission  Medication Sig Dispense Refill Last Dose  . alendronate (FOSAMAX) 70 MG tablet Take 1 tablet (70 mg total) by mouth once a week. Take with a full glass  of water on an empty stomach. (Patient taking differently: Take 70 mg by mouth every Monday. Take with a full glass of water on an empty stomach. ) 12 tablet 3 05/26/2019  . Ascorbic Acid (VITAMIN C) 1000 MG tablet Take 1,000 mg by mouth daily as needed (immune support).   Past Week at Unknown time  . Calcium Carb-Cholecalciferol (CALCIUM 500/D) 500-400 MG-UNIT CHEW Chew 1 tablet by mouth daily.    05/29/2019 at Unknown time  . calcium carbonate (TUMS - DOSED IN MG ELEMENTAL CALCIUM) 500 MG chewable tablet Chew 2 tablets by mouth daily as needed for indigestion or heartburn.    Past Month at Unknown time  . clobetasol cream (TEMOVATE) [id number] % Apply 1 application topically  2 (two) times daily. To worst of rash (Patient taking differently: Apply 1 application topically 2 (two) times daily as needed (rash). To worst of rash) 60 g 1 Past Week at Unknown time  . COD LIVER OIL PO Take 415 mg by mouth daily.    05/29/2019 at Unknown time  . Coenzyme Q10 (COQ-10) 100 MG CAPS Take 100 mg by mouth daily.   05/29/2019 at Unknown time  . fluocinonide cream (LIDEX) [id number] % Apply 1 application topically 2 (two) times daily. (Patient taking differently: Apply 1 application topically 2 (two) times daily as needed (rash). ) 30 g 3 Past Week at Unknown time  . Glucos-MSM-C-Mn-Ginger-Willow (GLUCOSAMINE MSM COMPLEX PO) Take 1 capsule by mouth daily.   05/29/2019 at Unknown time  . loratadine (CLARITIN) 10 MG tablet Take 10 mg by mouth daily.   05/30/2019 at 0615  . Multiple Vitamins-Minerals (ICAPS AREDS 2 PO) Take 1 capsule by mouth daily.    05/29/2019 at Unknown time  . Multiple Vitamins-Minerals (ONE-A-DAY WOMENS 50 PLUS PO) Take 1 tablet by mouth daily.    05/29/2019 at Unknown time  . omeprazole (PRILOSEC) 20 MG capsule Take 20 mg by mouth daily.   05/30/2019 at 0545  . rosuvastatin (CRESTOR) 10 MG tablet Take 1 tablet (10 mg total) by mouth daily. 90 tablet 3 05/29/2019 at Unknown time  . tobramycin-dexamethasone (TOBRADEX) ophthalmic solution Place 1 drop into both eyes every 4 (four) hours as needed (for eyelid rash).   05/29/2019 at Unknown time  . tolterodine (DETROL LA) 2 MG 24 hr capsule TAKE 1 CAPSULE(2 MG) BY MOUTH DAILY (Patient taking differently: Take 2 mg by mouth daily. ) 30 capsule 3 05/30/2019 at 0545  . vitamin E 400 UNIT capsule Take 400 Units by mouth daily as needed (leg cramps).   Past Week at Unknown time  . chlorpheniramine (CHLOR-TRIMETON) 4 MG tablet Take 4 mg by mouth every 4 (four) hours as needed for allergies.   05/28/2019    REVIEW OF SYSTEMS: A ROS was performed and pertinent positives and negatives are included in the history.  GENERAL: No fevers or  chills. HEENT: No change in vision, no earache, sore throat or sinus congestion. NECK: No pain or stiffness. CARDIOVASCULAR: No chest pain or pressure. No palpitations. PULMONARY: No shortness of breath, cough or wheeze. GASTROINTESTINAL: No abdominal pain, nausea, vomiting or diarrhea, melena or bright red blood per rectum. GENITOURINARY: No urinary frequency, urgency, hesitancy or dysuria. MUSCULOSKELETAL: No joint or muscle pain, no back pain, no recent trauma. DERMATOLOGIC: No rash, no itching, no lesions. ENDOCRINE: No polyuria, polydipsia, no heat or cold intolerance. No recent change in weight. HEMATOLOGICAL: No anemia or easy bruising or bleeding. NEUROLOGIC: No headache, seizures, numbness, tingling or weakness. PSYCHIATRIC: No  depression, no loss of interest in normal activity or change in sleep pattern.     Blood pressure 136/81, pulse 75, temperature 98.2 F (36.8 C), temperature source Oral, resp. rate 16, height 5\' 8"  (1.727 m), weight 63.7 kg, SpO2 99 %.  Physical Exam:  See office notes  Pelvic US 04/03/2019: T/V images.  Comparison is made with previous scan done July 2020.  Small retroverted uterus with no myometrial mass measuring 6.91 x 3.63 x 2.08 cm.  Asymmetrical endometrium with an 8 x 7 x 6 mm echogenic mass with cystic change compatible with a polyp, similar in size and appearance to previous scan.  The endometrial lining away from that lesion is measured at 2.51 mm.  Both ovaries are small with atrophic appearance, a tiny residual simple follicle less than 1 cm is present on the left ovary, unchanged since previous scan. No adnexal mass.  No free fluid in the posterior cul-de-sac.  Covid Negative   Assessment/Plan:  78 y.o. G2P2   1. Postmenopausal bleeding No recent PMB, but IU lesion persists.  2. Endometrial polyp Pelvic ultrasound findings reviewed with patient.  Asymmetrical endometrium with an 8 x 7 x 6 mm echogenic mass with cystic change compatible with a  polyp similar to previous scan.  Otherwise no significant findings on pelvic ultrasound.  We will proceed with hysteroscopy to excise the intrauterine lesion.  Schedule HSC Myosure excision, D+C.  Preop management, surgery with risks and benefits, as well as postop precautions thoroughly reviewed with patient.  Pamphlet on hysteroscopy given.  3. Vulvar rash Clobetasol daily until surgery.                        Patient was counseled as to the risk of surgery to include the following:  1. Infection (prohylactic antibiotics will be administered)  2. DVT/Pulmonary Embolism (prophylactic pneumo compression stockings will be used)  3.Trauma to internal organs requiring additional surgical procedure to repair any injury to internal organs requiring perhaps additional hospitalization days.  4.Hemmorhage requiring transfusion and blood products which carry risks such as anaphylactic reaction, hepatitis and AIDS  Patient had received literature information on the procedure scheduled and all her questions were answered and fully accepts all risk.  Sheila Anderson 05/30/2019, 7:27 AM

## 2019-05-30 NOTE — H&P (Deleted)
Operative Note  05/30/2019  10:07 AM  PATIENT:  Sheila Anderson  78 y.o. female  PRE-OPERATIVE DIAGNOSIS:  Post menopausal bleeding, Intrauterine lesion consistent with polyp  POST-OPERATIVE DIAGNOSIS:  Post menopausal bleeding, Intrauterine lesions consistent with polyps (2)  PROCEDURE:  Procedure(s): HYSTEROSCOPY WITH MYOSURE/DILATATION & CURETTAGE  SURGEON:  Surgeon(s): [name], MD  ANESTHESIA:   general  FINDINGS: Uterine cavity with both ostia well seen.  2 small endometrial polyps at the left posterior intrauterine wall.  DESCRIPTION OF OPERATION: Under general anesthesia with laryngeal mask, the patient is in lithotomy position.  The pessary is removed and put in a bag for the patient to reinsert on postop day 1.  She is prepped with Betadine on the suprapubic, vulvar and vaginal areas.  The bladder is catheterized.  The patient is draped as usual.  Timeout is done.  The vaginal exam reveals a retroverted uterus, normal size, mobile.  No adnexal mass felt.  The speculum is inserted in the vagina.  The anterior lip of the cervix is grasped with a tenaculum.  A paracervical block is done with Xylocaine 1% a total of 20 cc at 4 and 8:00.  The hysterometry is at 7 cm.  Dilation of the cervix with Pratt dilators up to #19 without difficulty.  The hysteroscope was inserted in the intra uterine cavity.  Inspection reveals 2 normal ostia.  2 small polyps are present on the left posterior wall of the intra uterine cavity.  No other lesion is seen.  The Myosure reach is inserted through the hysteroscope.  The 2 endometrial polyps are completely excised.  Hemostasis is adequate.  Pictures were taken before and after excision of the polyps.  The hysteroscope with MyoSure are removed from the intrauterine cavity.  We proceeded with a systematic curettage of the intrauterine cavity on all surfaces with a sharp curette.  Both specimens are sent together to pathology.  The tenaculum was  removed from the cervix.  Hemostasis is adequate.  The speculum is removed.  The patient is brought to recovery room in good and stable status.  ESTIMATED BLOOD LOSS: 5 mL   Intake/Output Summary (Last 24 hours) at 05/30/2019 1007 Last data filed at 05/30/2019 [id number] Gross per 24 hour  Intake 800 ml  Output 10 ml  Net 790 ml     BLOOD ADMINISTERED:none   LOCAL MEDICATIONS USED:  XYLOCAINE 1% Paracervical block  SPECIMEN:  Source of Specimen:  Endometrial polyp excision material and endometrial curettings  DISPOSITION OF SPECIMEN:  PATHOLOGY  COUNTS:  YES  PLAN OF CARE: Transfer to PACU  Marie-Lyne LavoieMD10:07 AM

## 2019-05-30 NOTE — Anesthesia Preprocedure Evaluation (Addendum)
Anesthesia Evaluation  Patient identified by MRN, date of birth, ID band Patient awake    Reviewed: Allergy & Precautions, NPO status , Patient's Chart, lab work & pertinent test results  History of Anesthesia Complications Negative for: history of anesthetic complications  Airway Mallampati: III  TM Distance: <3 FB     Dental  (+) Dental Advisory Given, Teeth Intact   Pulmonary neg pulmonary ROS, neg recent URI,    breath sounds clear to auscultation       Cardiovascular negative cardio ROS   Rhythm:Regular     Neuro/Psych PSYCHIATRIC DISORDERS Anxiety  Neuromuscular disease    GI/Hepatic Neg liver ROS, hiatal hernia, GERD  Medicated and Controlled,  Endo/Other  negative endocrine ROS  Renal/GU negative Renal ROS     Musculoskeletal   Abdominal   Peds  Hematology negative hematology ROS (+)   Anesthesia Other Findings   Reproductive/Obstetrics                            Anesthesia Physical Anesthesia Plan  ASA: II  Anesthesia Plan: General   Post-op Pain Management:    Induction: Intravenous  PONV Risk Score and Plan: 3 and Ondansetron and Dexamethasone  Airway Management Planned: LMA  Additional Equipment: None  Intra-op Plan:   Post-operative Plan: Extubation in OR  Informed Consent: I have reviewed the patients History and Physical, chart, labs and discussed the procedure including the risks, benefits and alternatives for the proposed anesthesia with the patient or authorized representative who has indicated his/her understanding and acceptance.     Dental advisory given  Plan Discussed with: CRNA and Surgeon  Anesthesia Plan Comments:         Anesthesia Quick Evaluation

## 2019-05-30 NOTE — Anesthesia Postprocedure Evaluation (Signed)
Anesthesia Post Note  Patient: Sheila Anderson  Procedure(s) Performed: DILATATION & CURETTAGE/HYSTEROSCOPY WITH MYOSURE (N/A Vagina )     Patient location during evaluation: PACU Anesthesia Type: General Level of consciousness: awake and alert Pain management: pain level controlled Vital Signs Assessment: post-procedure vital signs reviewed and stable Respiratory status: spontaneous breathing, nonlabored ventilation, respiratory function stable and patient connected to nasal cannula oxygen Cardiovascular status: blood pressure returned to baseline and stable Postop Assessment: no apparent nausea or vomiting Anesthetic complications: no    Last Vitals:  Vitals:   05/30/19 1015 05/30/19 1030  BP: 120/74 130/71  Pulse: 66 66  Resp: 18 13  Temp:    SpO2: 100% 98%    Last Pain:  Vitals:   05/30/19 1030  TempSrc:   PainSc: 0-No pain                 Baxter Gonzalez

## 2019-05-30 NOTE — Transfer of Care (Signed)
Immediate Anesthesia Transfer of Care Note  Patient: Sheila Anderson  Procedure(s) Performed: DILATATION & CURETTAGE/HYSTEROSCOPY WITH MYOSURE (N/A Vagina )  Patient Location: PACU  Anesthesia Type:General  Level of Consciousness: awake, alert  and oriented  Airway & Oxygen Therapy: Patient Spontanous Breathing and Patient connected to nasal cannula oxygen  Post-op Assessment: Report given to RN and Post -op Vital signs reviewed and stable  Post vital signs: Reviewed and stable  Last Vitals:  Vitals Value Taken Time  BP 124/75 05/30/19 0915  Temp 36.3 C 05/30/19 0910  Pulse 68 05/30/19 0922  Resp 11 05/30/19 0922  SpO2 100 % 05/30/19 0922  Vitals shown include unvalidated device data.  Last Pain:  Vitals:   05/30/19 0910  TempSrc:   PainSc: 0-No pain      Patients Stated Pain Goal: 5 ([phone])  Complications: No apparent anesthesia complications

## 2019-05-30 NOTE — Anesthesia Procedure Notes (Signed)
Procedure Name: LMA Insertion Date/Time: 05/30/2019 8:36 AM Performed by: Kenwood Rosiak D, CRNA Pre-anesthesia Checklist: Patient identified, Emergency Drugs available, Suction available and Patient being monitored Patient Re-evaluated:Patient Re-evaluated prior to induction Oxygen Delivery Method: Circle system utilized Preoxygenation: Pre-oxygenation with 100% oxygen Induction Type: IV induction Ventilation: Mask ventilation without difficulty LMA: LMA inserted LMA Size: 4.0 Tube type: Oral Number of attempts: 1 Placement Confirmation: positive ETCO2 and breath sounds checked- equal and bilateral Tube secured with: Tape Dental Injury: Teeth and Oropharynx as per pre-operative assessment

## 2019-05-30 NOTE — Op Note (Signed)
Operative Note  05/30/2019  10:07 AM  PATIENT:  Sheila Anderson  78 y.o. female  PRE-OPERATIVE DIAGNOSIS:  Post menopausal bleeding, Intrauterine lesion consistent with polyp  POST-OPERATIVE DIAGNOSIS:  Post menopausal bleeding, Intrauterine lesions consistent with polyps (2)  PROCEDURE:  Procedure(s): HYSTEROSCOPY WITH MYOSURE/DILATATION & CURETTAGE  SURGEON:  Surgeon(s): [name], MD  ANESTHESIA:   general  FINDINGS: Uterine cavity with both ostia well seen.  2 small endometrial polyps at the left posterior intrauterine wall.  DESCRIPTION OF OPERATION: Under general anesthesia with laryngeal mask, the patient is in lithotomy position.  The pessary is removed and put in a bag for the patient to reinsert on postop day 1.  She is prepped with Betadine on the suprapubic, vulvar and vaginal areas.  The bladder is catheterized.  The patient is draped as usual.  Timeout is done.  The vaginal exam reveals a retroverted uterus, normal size, mobile.  No adnexal mass felt.  The speculum is inserted in the vagina.  The anterior lip of the cervix is grasped with a tenaculum.  A paracervical block is done with Xylocaine 1% a total of 20 cc at 4 and 8:00.  The hysterometry is at 7 cm.  Dilation of the cervix with Pratt dilators up to #19 without difficulty.  The hysteroscope was inserted in the intra uterine cavity.  Inspection reveals 2 normal ostia.  2 small polyps are present on the left posterior wall of the intra uterine cavity.  No other lesion is seen.  The Myosure reach is inserted through the hysteroscope.  The 2 endometrial polyps are completely excised.  Hemostasis is adequate.  Pictures were taken before and after excision of the polyps.  The hysteroscope with MyoSure are removed from the intrauterine cavity.  We proceeded with a systematic curettage of the intrauterine cavity on all surfaces with a sharp curette.  Both specimens are sent together to pathology.  The tenaculum  was removed from the cervix.  Hemostasis is adequate.  The speculum is removed.  The patient is brought to recovery room in good and stable status.  ESTIMATED BLOOD LOSS: 5 mL   Intake/Output Summary (Last 24 hours) at 05/30/2019 1007 Last data filed at 05/30/2019 [id number]    Gross per 24 hour  Intake 800 ml  Output 10 ml  Net 790 ml     BLOOD ADMINISTERED:none   LOCAL MEDICATIONS USED:  XYLOCAINE 1% Paracervical block  SPECIMEN:  Source of Specimen:  Endometrial polyp excision material and endometrial curettings  DISPOSITION OF SPECIMEN:  PATHOLOGY  COUNTS:  YES  PLAN OF CARE: Transfer to PACU  Marie-Lyne LavoieMD10:07 AM

## 2019-05-30 NOTE — Discharge Instructions (Addendum)
Hysteroscopy, Care After This sheet gives you information about how to care for yourself after your procedure. Your health care provider may also give you more specific instructions. If you have problems or questions, contact your health care provider. What can I expect after the procedure? After the procedure, it is common to have:  Cramping.  Bleeding. This can vary from light spotting to menstrual-like bleeding. Follow these instructions at home: Activity  Rest for 1-2 days after the procedure.  Do not douche, use tampons, or have sex for 2 weeks after the procedure, or until your health care provider approves.  Do not drive for 24 hours after the procedure, or for as long as told by your health care provider.  Do not drive, use heavy machinery, or drink alcohol while taking prescription pain medicines. Medicines   Take over-the-counter and prescription medicines only as told by your health care provider.  Do not take aspirin during recovery. It can increase the risk of bleeding. General instructions  Do not take baths, swim, or use a hot tub until your health care provider approves. Take showers instead of baths for 2 weeks, or for as long as told by your health care provider.  To prevent or treat constipation while you are taking prescription pain medicine, your health care provider may recommend that you: ? Drink enough fluid to keep your urine clear or pale yellow. ? Take over-the-counter or prescription medicines. ? Eat foods that are high in fiber, such as fresh fruits and vegetables, whole grains, and beans. ? Limit foods that are high in fat and processed sugars, such as fried and sweet foods.  Keep all follow-up visits as told by your health care provider. This is important. Contact a health care provider if:  You feel dizzy or lightheaded.  You feel nauseous.  You have abnormal vaginal discharge.  You have a rash.  You have pain that does not get better with  medicine.  You have chills. Get help right away if:  You have bleeding that is heavier than a normal menstrual period.  You have a fever.  You have pain or cramps that get worse.  You develop new abdominal pain.  You faint.  You have pain in your shoulders.  You have shortness of breath. Summary  After the procedure, you may have cramping and some vaginal bleeding.  Do not douche, use tampons, or have sex for 2 weeks after the procedure, or until your health care provider approves.  Do not take baths, swim, or use a hot tub until your health care provider approves. Take showers instead of baths for 2 weeks, or for as long as told by your health care provider.  Report any unusual symptoms to your health care provider.  Keep all follow-up visits as told by your health care provider. This is important. This information is not intended to replace advice given to you by your health care provider. Make sure you discuss any questions you have with your health care provider. Document Released: 03/19/2013 Document Revised: 05/11/2017 Document Reviewed: 06/27/2016 Elsevier Patient Education  [place] care Instructions:   Personal hygiene:  Used sanitary napkins for vaginal drainage not tampons. Shower or tub bathe the day after your procedure. No douching until bleeding stops. Always wipe from front to back after  Elimination.  Activity: Do not drive or operate any equipment today. The effects of the anesthesia are still present and drowsiness may result. Rest  today, not necessarily flat bed rest, just take it easy. You may resume your normal activity in one to 2 days.  Sexual activity: No intercourse for one week or as indicated by your physician  Diet: Eat a light diet as desired this evening. You may resume a regular diet tomorrow.  Return to work: One to 2 days.  General Expectations of your surgery: Vaginal bleeding should be no heavier than a normal  period. Spotting may continue up to 10 days. Mild cramps may continue for a couple of days. You may have a regular period in 2-6 weeks.  Unexpected observations call your doctor if these occur: persistent or heavy bleeding. Severe abdominal cramping or pain. Elevation of temperature greater than 100F.  Call for an appointment in one week.    Patient's Signature_______________________________________________________  Nurse's Signature________________________________________________________ Post Anesthesia Home Care Instructions  Activity: Get plenty of rest for the remainder of the day. A responsible individual must stay with you for 24 hours following the procedure.  For the next 24 hours, DO NOT: -Drive a car -Paediatric nurse -Drink alcoholic beverages -Take any medication unless instructed by your physician -Make any legal decisions or sign important papers.  Meals: Start with liquid foods such as gelatin or soup. Progress to regular foods as tolerated. Avoid greasy, spicy, heavy foods. If nausea and/or vomiting occur, drink only clear liquids until the nausea and/or vomiting subsides. Call your physician if vomiting continues.  Special Instructions/Symptoms: Your throat may feel dry or sore from the anesthesia or the breathing tube placed in your throat during surgery. If this causes discomfort, gargle with warm salt water. The discomfort should disappear within 24 hours.  If you had a scopolamine patch placed behind your ear for the management of post- operative nausea and/or vomiting:  1. The medication in the patch is effective for 72 hours, after which it should be removed.  Wrap patch in a tissue and discard in the trash. Wash hands thoroughly with soap and water. 2. You may remove the patch earlier than 72 hours if you experience unpleasant side effects which may include dry mouth, dizziness or visual disturbances. 3. Avoid touching the patch. Wash your hands with soap and  water after contact with the patch.

## 2019-06-02 LAB — SURGICAL PATHOLOGY

## 2019-06-19 ENCOUNTER — Other Ambulatory Visit: Payer: Self-pay

## 2019-06-23 ENCOUNTER — Ambulatory Visit (INDEPENDENT_AMBULATORY_CARE_PROVIDER_SITE_OTHER): Payer: Medicare PPO | Admitting: Obstetrics & Gynecology

## 2019-06-23 ENCOUNTER — Encounter: Payer: Self-pay | Admitting: Obstetrics & Gynecology

## 2019-06-23 ENCOUNTER — Other Ambulatory Visit: Payer: Self-pay

## 2019-06-23 VITALS — BP 132/82

## 2019-06-23 DIAGNOSIS — K644 Residual hemorrhoidal skin tags: Secondary | ICD-10-CM

## 2019-06-23 DIAGNOSIS — R350 Frequency of micturition: Secondary | ICD-10-CM | POA: Diagnosis not present

## 2019-06-23 DIAGNOSIS — Z09 Encounter for follow-up examination after completed treatment for conditions other than malignant neoplasm: Secondary | ICD-10-CM

## 2019-06-23 MED ORDER — CIPROFLOXACIN HCL 500 MG PO TABS: 500.0000 mg | ORAL_TABLET | Freq: Two times a day (BID) | ORAL | 0 refills | Status: AC

## 2019-06-23 NOTE — Progress Notes (Addendum)
    OAK[place] COURTEMANCHE June 02, 1941 [id number]        79 y.o.  G2P2   RP: Postop HSC/Myosure Excision/D+C on 05/30/2019 and Urinary frequency  HPI: Very good postop evolution with no abdominopelvic pain, no vaginal discharge, no vaginal bleeding.  No fever.  Complains of urinary frequency for a few days.  No blood seen in urine and no burning when passing urine.  Bowel movements normal.  Complaints of symptomatic external hemorrhoids with discomfort, which improved with over-the-counter corticosteroid cream and suppositories.  No rectal bleeding.   OB History  Gravida Para Term Preterm AB Living  2 2       2   SAB TAB Ectopic Multiple Live Births               # Outcome Date GA Lbr Len/2nd Weight Sex Delivery Anes PTL Lv  2 Para           1 Para             Past medical history,surgical history, problem list, medications, allergies, family history and social history were all reviewed and documented in the EPIC chart.   Directed ROS with pertinent positives and negatives documented in the history of present illness/assessment and plan.  Exam:  Vitals:   06/23/19 1022  BP: 132/82   General appearance:  Normal  CVAT Negative bilaterally  Abdomen: Normal  Gynecologic exam: Vulva normal.  Perianal erythema.  External Hemorrhoids Non-thrombosed, not bleeding.  Bimanual exam:  Uterus AV, normal volume, mobile, NT,   Patho 05/30/2019: FINAL MICROSCOPIC DIAGNOSIS:   A. ENDOMETRIUM, POLYP, CURETTAGE:  - Benign endometrial polyp  - Benign inactive endometrium  - Leiomyoma (0.1 cm)  - No hyperplasia or malignancy identified   U/A:  Yellow cloudy, Protein negative, Nitrite negative, WBC 10-20, RBC 10-20, Bacteria Many.  Urine Culture pending.  Assessment/Plan:  79 y.o. G2P2   1. Status post gynecological surgery, follow-up exam Good postop evolution with no complication.  Pathology showed a benign endometrial polyp and benign inactive endometrium as well as a very small  leiomyoma.  Pathology reviewed with patient and patient reassured.  Can resume all activities.  2. Frequent urination Abnormal U/A, probable Acute Cystitis.  Decision to treat with ciprofloxacin.  No allergy.  Usage reviewed.  Prescription sent to pharmacy.  Pending urine culture. - Urinalysis,Complete w/RFL Culture  3. Inflamed external hemorrhoid Will continue OTC CS cream/suppositories x 2 weeks.  Other orders - ciprofloxacin (CIPRO) 500 MG tablet; Take 1 tablet (500 mg total) by mouth 2 (two) times daily for 7 days.  Follow-up for annual gynecologic exam.  Counseling on above issues and coordination of care more than 50% for 15 minutes.  [name] MD, 10:40 AM 06/23/2019

## 2019-06-23 NOTE — Patient Instructions (Signed)
1. Status post gynecological surgery, follow-up exam Good postop evolution with no complication.  Pathology showed a benign endometrial polyp and benign inactive endometrium as well as a very small leiomyoma.  Pathology reviewed with patient and patient reassured.  Can resume all activities.  2. Frequent urination Abnormal U/A, probable Acute Cystitis.  Decision to treat with ciprofloxacin.  No allergy.  Usage reviewed.  Prescription sent to pharmacy.  Pending urine culture. - Urinalysis,Complete w/RFL Culture  3. Inflamed external hemorrhoid Will continue OTC CS cream/suppositories x 2 weeks.  Other orders - ciprofloxacin (CIPRO) 500 MG tablet; Take 1 tablet (500 mg total) by mouth 2 (two) times daily for 7 days.  Follow-up for annual gynecologic exam.  Sheila Anderson, it was a pleasure seeing you today!  I will inform you of your results as soon as they are available.

## 2019-06-25 LAB — URINALYSIS, COMPLETE W/RFL CULTURE
Bilirubin Urine: NEGATIVE
Glucose, UA: NEGATIVE
Hyaline Cast: NONE SEEN /LPF
Ketones, ur: NEGATIVE
Nitrites, Initial: NEGATIVE
Protein, ur: NEGATIVE
Specific Gravity, Urine: 1.025 (ref 1.001–1.03)
pH: 5 (ref 5.0–8.0)

## 2019-06-25 LAB — URINE CULTURE
MICRO NUMBER:: 10027638
SPECIMEN QUALITY:: ADEQUATE

## 2019-06-25 LAB — CULTURE INDICATED

## 2019-07-04 ENCOUNTER — Ambulatory Visit: Payer: Medicare PPO | Attending: Internal Medicine

## 2019-07-04 DIAGNOSIS — Z23 Encounter for immunization: Secondary | ICD-10-CM

## 2019-07-04 NOTE — Progress Notes (Signed)
   Covid-19 Vaccination Clinic  Name:  Sheila Anderson    MRN: [id number] DOB: Oct 22, 1940  07/04/2019  [name] was observed post Covid-19 immunization for 15 minutes without incidence. She was provided with Vaccine Information Sheet and instruction to access the V-Safe system.   [name] was instructed to call 911 with any severe reactions post vaccine: Marland Kitchen Difficulty breathing  . Swelling of your face and throat  . A fast heartbeat  . A bad rash all over your body  . Dizziness and weakness    Immunizations Administered    Name Date Dose VIS Date Route   Pfizer COVID-19 Vaccine 07/04/2019  6:45 PM 0.3 mL 05/23/2019 Intramuscular   Manufacturer: [place]   Lot: [id number]   [place]: [id number]

## 2019-07-26 ENCOUNTER — Ambulatory Visit: Payer: Medicare PPO | Attending: Internal Medicine

## 2019-07-26 DIAGNOSIS — Z23 Encounter for immunization: Secondary | ICD-10-CM | POA: Insufficient documentation

## 2019-07-26 NOTE — Progress Notes (Signed)
   Covid-19 Vaccination Clinic  Name:  Sheila Anderson    MRN: [id number] DOB: 1940/06/24  07/26/2019  [name] was observed post Covid-19 immunization for 15 minutes without incidence. She was provided with Vaccine Information Sheet and instruction to access the V-Safe system.   [name] was instructed to call 911 with any severe reactions post vaccine: Marland Kitchen Difficulty breathing  . Swelling of your face and throat  . A fast heartbeat  . A bad rash all over your body  . Dizziness and weakness    Immunizations Administered    Name Date Dose VIS Date Route   Pfizer COVID-19 Vaccine 07/26/2019 12:21 PM 0.3 mL 05/23/2019 Intramuscular   Manufacturer: [place]   Lot: [id number]   [place]: [id number]

## 2019-08-15 ENCOUNTER — Telehealth: Payer: Self-pay | Admitting: Emergency Medicine

## 2019-08-15 MED ORDER — ALENDRONATE SODIUM 70 MG PO TABS: 70.0000 mg | ORAL_TABLET | ORAL | 3 refills | Status: DC

## 2019-08-15 NOTE — Telephone Encounter (Signed)
Medication Refill - Medication: alendronate (FOSAMAX) 70 MG tablet   Has the patient contacted their pharmacy? Yes.   (Agent: If no, request that the patient contact the pharmacy for the refill.) (Agent: If yes, when and what did the pharmacy advise?)  Preferred Pharmacy (with phone number or street name):  [facility] [place], [place] - [address]  [place] [address]  Phone: [phone] Fax: [phone]     Agent: Please be advised that RX refills may take up to 3 business days. We ask that you follow-up with your pharmacy.

## 2019-08-15 NOTE — Telephone Encounter (Signed)
Requested medication (s) are due for refill today: Yes  Requested medication (s) are on the active medication list: Yes  Last refill:  07/05/18  Future visit scheduled: No  Notes to clinic:  Prescription has expired.    Requested Prescriptions  Pending Prescriptions Disp Refills   alendronate (FOSAMAX) 70 MG tablet 12 tablet 3    Sig: Take 1 tablet (70 mg total) by mouth once a week. Take with a full glass of water on an empty stomach.      Endocrinology:  Bisphosphonates Failed - 08/15/2019  1:51 PM      Failed - Vitamin D in normal range and within 360 days    Vit D, 25-Hydroxy  Date Value Ref Range Status  07/05/2018 31.4 30.0 - 100.0 ng/mL Final    Comment:    Vitamin D deficiency has been defined by the [place] practice guideline as a level of serum 25-OH vitamin D less than 20 ng/mL (1,2). The Endocrine Society went on to further define vitamin D insufficiency as a level between 21 and 29 ng/mL (2). 1. IOM (Institute of Medicine). 2010. Dietary reference    intakes for calcium and D. [place]: The    [employer]. 2. Holick MF, Binkley Alton, Bischoff-Ferrari HA, et al.    Evaluation, treatment, and prevention of vitamin D    deficiency: an Endocrine Society clinical practice    guideline. JCEM. 2011 Jul; 96(7):1911-30.           Passed - Ca in normal range and within 360 days    Calcium  Date Value Ref Range Status  08/27/2018 9.8 8.7 - 10.3 mg/dL Final          Passed - Valid encounter within last 12 months    Recent Outpatient Visits           5 months ago Multiple contusions   [facility] at [facility], [place], MD   11 months ago Rash and nonspecific skin eruption   Primary Care at [facility], Ines Bloomer, MD   1 year ago Rib pain   Primary Care at [place], Lanelle Bal, DO   2 years ago Flexural eczema   [facility] at [facility], Laurey Arrow, MD   3 years ago Incisional hernia,  without obstruction or gangrene   [facility] at [facility], Laurey Arrow, MD

## 2019-08-25 NOTE — Telephone Encounter (Signed)
Pt calling to check on this.  States that she is completely out of medication.

## 2019-08-26 ENCOUNTER — Other Ambulatory Visit: Payer: Self-pay | Admitting: Emergency Medicine

## 2019-08-26 MED ORDER — ALENDRONATE SODIUM 70 MG PO TABS: 70.0000 mg | ORAL_TABLET | ORAL | 3 refills | Status: DC

## 2019-08-26 NOTE — Telephone Encounter (Signed)
Please Advise

## 2019-09-03 DIAGNOSIS — K3189 Other diseases of stomach and duodenum: Secondary | ICD-10-CM | POA: Diagnosis not present

## 2019-09-03 DIAGNOSIS — K449 Diaphragmatic hernia without obstruction or gangrene: Secondary | ICD-10-CM | POA: Diagnosis not present

## 2019-09-03 DIAGNOSIS — D132 Benign neoplasm of duodenum: Secondary | ICD-10-CM | POA: Diagnosis not present

## 2019-09-03 DIAGNOSIS — K317 Polyp of stomach and duodenum: Secondary | ICD-10-CM | POA: Diagnosis not present

## 2019-09-03 DIAGNOSIS — K219 Gastro-esophageal reflux disease without esophagitis: Secondary | ICD-10-CM | POA: Diagnosis not present

## 2019-09-03 DIAGNOSIS — K2970 Gastritis, unspecified, without bleeding: Secondary | ICD-10-CM | POA: Diagnosis not present

## 2019-10-01 ENCOUNTER — Other Ambulatory Visit: Payer: Self-pay

## 2019-10-02 ENCOUNTER — Ambulatory Visit: Payer: Medicare PPO | Admitting: Obstetrics & Gynecology

## 2019-10-02 ENCOUNTER — Encounter: Payer: Self-pay | Admitting: Obstetrics & Gynecology

## 2019-10-02 VITALS — BP 120/78

## 2019-10-02 DIAGNOSIS — R350 Frequency of micturition: Secondary | ICD-10-CM | POA: Diagnosis not present

## 2019-10-02 DIAGNOSIS — R21 Rash and other nonspecific skin eruption: Secondary | ICD-10-CM | POA: Diagnosis not present

## 2019-10-02 DIAGNOSIS — Z4689 Encounter for fitting and adjustment of other specified devices: Secondary | ICD-10-CM | POA: Diagnosis not present

## 2019-10-02 DIAGNOSIS — N898 Other specified noninflammatory disorders of vagina: Secondary | ICD-10-CM | POA: Diagnosis not present

## 2019-10-02 LAB — WET PREP FOR TRICH, YEAST, CLUE

## 2019-10-02 MED ORDER — SULFAMETHOXAZOLE-TRIMETHOPRIM 800-160 MG PO TABS: 1.0000 | ORAL_TABLET | Freq: Two times a day (BID) | ORAL | 0 refills | Status: AC

## 2019-10-02 MED ORDER — CLOBETASOL PROPIONATE 0.05 % EX OINT: 1.0000 "application " | TOPICAL_OINTMENT | Freq: Every day | CUTANEOUS | 3 refills | Status: DC

## 2019-10-02 NOTE — Progress Notes (Signed)
    Sheila Anderson 04/24/1941 [id number]        78 y.o.  G2P2L2   RP: Vulvar irritation and urinary frequency  HPI: Vulvar irritation on-off x many months.  C/O urinary frequency as well as some incontinence when the bladder is full.  Well with pessary.  No vaginal pain.  No vaginal bleeding.  No pelvic pain.  No fever.   OB History  Gravida Para Term Preterm AB Living  2 2       2   SAB TAB Ectopic Multiple Live Births               # Outcome Date GA Lbr Len/2nd Weight Sex Delivery Anes PTL Lv  2 Para           1 Para             Past medical history,surgical history, problem list, medications, allergies, family history and social history were all reviewed and documented in the EPIC chart.   Directed ROS with pertinent positives and negatives documented in the history of present illness/assessment and plan.  Exam:  Vitals:   10/02/19 1058  BP: 120/78   General appearance:  Normal  CVAT Negative  Abdomen: Normal  Gynecologic exam: Vulvar erythema mainly at the perineum and perianally with fissures.  Pessary removed.  No vaginal erosion or ulceration.  Typical mild vaginal d/c associated with pessary.  Wet prep done.                                  Pessary washed and put back in place in the vagina.  Wet prep Negative  U/A yellow cloudy, protein negative, nitrites negative, white blood cells 40-60, red blood cells negative, bacteria moderate.  Urine culture pending.   Assessment/Plan:  79 y.o. G2P2   1. Frequency of urination Probable acute cystitis per symptoms and urine analysis.  Will treat with Bactrim DS 1 tablet twice a day for 3 days.  Usage reviewed and prescription sent to pharmacy.  Urine culture pending. - Urinalysis,Complete w/RFL Culture  2. Vulvar rash Vulvar rash with negative wet prep.  Probably associated with irritation from the discharge due to the pessary and possibly as well due to some urinary incontinence.  Will treat the inflammation with  clobetasol ointment [id number] a thin application on the affected vulva and the perineal area at bedtime daily for 2 weeks and then as needed.  May use Vaseline to protect from the irritation of the vaginal discharge and urinary leakage.  3. Vaginal discharge Wet prep negative.  Discharge probably secondary to the pessary. - WET PREP FOR TRICH, YEAST, CLUE  4. Pessary maintenance Pessary cleaned and put back in place.  Good fit.  Other orders - sulfamethoxazole-trimethoprim (BACTRIM DS) 800-160 MG tablet; Take 1 tablet by mouth 2 (two) times daily for 3 days. - clobetasol ointment (TEMOVATE) 0.05 %; Apply 1 application topically at bedtime. Thin layer daily at bedtime at the affected vulva and perianally x 2 weeks, then twice weekly. - Urine Culture - REFLEXIVE URINE CULTURE  [name] MD, 11:13 AM 10/02/2019

## 2019-10-03 ENCOUNTER — Encounter: Payer: Self-pay | Admitting: Obstetrics & Gynecology

## 2019-10-03 NOTE — Patient Instructions (Signed)
1. Frequency of urination Probable acute cystitis per symptoms and urine analysis.  Will treat with Bactrim DS 1 tablet twice a day for 3 days.  Usage reviewed and prescription sent to pharmacy.  Urine culture pending. - Urinalysis,Complete w/RFL Culture  2. Vulvar rash Vulvar rash with negative wet prep.  Probably associated with irritation from the discharge due to the pessary and possibly as well due to some urinary incontinence.  Will treat the inflammation with clobetasol ointment [id number] a thin application on the affected vulva and the perineal area at bedtime daily for 2 weeks and then as needed.  May use Vaseline to protect from the irritation of the vaginal discharge and urinary leakage.  3. Vaginal discharge Wet prep negative.  Discharge probably secondary to the pessary. - WET PREP FOR TRICH, YEAST, CLUE  4. Pessary maintenance Pessary cleaned and put back in place.  Good fit.  Other orders - sulfamethoxazole-trimethoprim (BACTRIM DS) 800-160 MG tablet; Take 1 tablet by mouth 2 (two) times daily for 3 days. - clobetasol ointment (TEMOVATE) 0.05 %; Apply 1 application topically at bedtime. Thin layer daily at bedtime at the affected vulva and perianally x 2 weeks, then twice weekly. - Urine Culture - REFLEXIVE URINE CULTURE  Sheila Anderson, it was a pleasure seeing you today!  I will inform you of your results as soon as they are available.

## 2019-10-04 LAB — URINE CULTURE
MICRO NUMBER:: 10394479
Result:: NO GROWTH
SPECIMEN QUALITY:: ADEQUATE

## 2019-10-04 LAB — URINALYSIS, COMPLETE W/RFL CULTURE
Bilirubin Urine: NEGATIVE
Glucose, UA: NEGATIVE
Hgb urine dipstick: NEGATIVE
Hyaline Cast: NONE SEEN /LPF
Ketones, ur: NEGATIVE
Nitrites, Initial: NEGATIVE
Protein, ur: NEGATIVE
RBC / HPF: NONE SEEN /HPF (ref 0–2)
Specific Gravity, Urine: 1.025 (ref 1.001–1.03)
pH: 5.5 (ref 5.0–8.0)

## 2019-10-04 LAB — CULTURE INDICATED

## 2019-10-08 DIAGNOSIS — L218 Other seborrheic dermatitis: Secondary | ICD-10-CM | POA: Diagnosis not present

## 2019-10-08 DIAGNOSIS — L308 Other specified dermatitis: Secondary | ICD-10-CM | POA: Diagnosis not present

## 2019-10-08 DIAGNOSIS — L818 Other specified disorders of pigmentation: Secondary | ICD-10-CM | POA: Diagnosis not present

## 2019-10-08 DIAGNOSIS — L304 Erythema intertrigo: Secondary | ICD-10-CM | POA: Diagnosis not present

## 2019-12-11 ENCOUNTER — Other Ambulatory Visit: Payer: Self-pay | Admitting: Obstetrics & Gynecology

## 2019-12-23 ENCOUNTER — Other Ambulatory Visit: Payer: Self-pay | Admitting: Emergency Medicine

## 2019-12-23 DIAGNOSIS — E785 Hyperlipidemia, unspecified: Secondary | ICD-10-CM

## 2019-12-23 NOTE — Telephone Encounter (Signed)
Requested medication (s) are due for refill today: Yes  Requested medication (s) are on the active medication list: Yes  Last refill:  08/27/18  Future visit scheduled: No  Notes to clinic:  Prescription has expired.    Requested Prescriptions  Pending Prescriptions Disp Refills   rosuvastatin (CRESTOR) 10 MG tablet [Pharmacy Med Name: ROSUVASTATIN 10MG  TABLETS] 90 tablet 3    Sig: TAKE 1 TABLET(10 MG) BY MOUTH DAILY      Cardiovascular:  Antilipid - Statins Failed - 12/23/2019 11:39 AM      Failed - Total Cholesterol in normal range and within 360 days    Cholesterol, Total  Date Value Ref Range Status  08/27/2018 266 (H) 100 - 199 mg/dL Final          Failed - LDL in normal range and within 360 days    LDL Calculated  Date Value Ref Range Status  08/27/2018 179 (H) 0 - 99 mg/dL Final          Failed - HDL in normal range and within 360 days    HDL  Date Value Ref Range Status  08/27/2018 73 >39 mg/dL Final          Failed - Triglycerides in normal range and within 360 days    Triglycerides  Date Value Ref Range Status  08/27/2018 71 0 - 149 mg/dL Final          Passed - Patient is not pregnant      Passed - Valid encounter within last 12 months    Recent Outpatient Visits           10 months ago Multiple contusions   [facility] at [place], Ines Bloomer, MD   1 year ago Rash and nonspecific skin eruption   Primary Care at [place], Ines Bloomer, MD   1 year ago Rib pain   Primary Care at [place], Lanelle Bal, DO   3 years ago Flexural eczema   [facility] at [facility], Laurey Arrow, MD   3 years ago Incisional hernia, without obstruction or gangrene   [facility] at [facility], Laurey Arrow, MD

## 2019-12-23 NOTE — Telephone Encounter (Signed)
LVTCB to sch med refill appt.

## 2019-12-23 NOTE — Telephone Encounter (Signed)
Please schedule a f/u appt for f/u and labs for any further refills.

## 2019-12-23 NOTE — Telephone Encounter (Signed)
No further refills without office visit 

## 2020-01-19 ENCOUNTER — Other Ambulatory Visit: Payer: Self-pay | Admitting: Emergency Medicine

## 2020-01-19 DIAGNOSIS — E785 Hyperlipidemia, unspecified: Secondary | ICD-10-CM

## 2020-03-30 ENCOUNTER — Other Ambulatory Visit: Payer: Self-pay | Admitting: Emergency Medicine

## 2020-03-30 DIAGNOSIS — E785 Hyperlipidemia, unspecified: Secondary | ICD-10-CM

## 2020-03-30 NOTE — Telephone Encounter (Signed)
No further refills without office visit 

## 2020-03-30 NOTE — Telephone Encounter (Signed)
Please schedule patient appt for f/u and med refills. #30 day supply has been sent

## 2020-03-31 NOTE — Telephone Encounter (Signed)
Called Pt LVMTCB on 03/31/20 @ 11:59 Am

## 2020-04-15 ENCOUNTER — Other Ambulatory Visit: Payer: Self-pay | Admitting: Obstetrics & Gynecology

## 2020-05-03 DIAGNOSIS — H01134 Eczematous dermatitis of left upper eyelid: Secondary | ICD-10-CM | POA: Diagnosis not present

## 2020-05-03 DIAGNOSIS — L03213 Periorbital cellulitis: Secondary | ICD-10-CM | POA: Diagnosis not present

## 2020-05-08 DIAGNOSIS — L309 Dermatitis, unspecified: Secondary | ICD-10-CM | POA: Diagnosis not present

## 2020-05-11 DIAGNOSIS — L309 Dermatitis, unspecified: Secondary | ICD-10-CM | POA: Diagnosis not present

## 2020-05-17 ENCOUNTER — Other Ambulatory Visit: Payer: Self-pay | Admitting: Emergency Medicine

## 2020-05-17 DIAGNOSIS — E785 Hyperlipidemia, unspecified: Secondary | ICD-10-CM

## 2020-05-18 DIAGNOSIS — L235 Allergic contact dermatitis due to other chemical products: Secondary | ICD-10-CM | POA: Diagnosis not present

## 2020-05-27 DIAGNOSIS — L235 Allergic contact dermatitis due to other chemical products: Secondary | ICD-10-CM | POA: Diagnosis not present

## 2020-05-31 DIAGNOSIS — L239 Allergic contact dermatitis, unspecified cause: Secondary | ICD-10-CM | POA: Diagnosis not present

## 2020-06-18 DIAGNOSIS — L239 Allergic contact dermatitis, unspecified cause: Secondary | ICD-10-CM | POA: Diagnosis not present

## 2020-06-24 DIAGNOSIS — H35372 Puckering of macula, left eye: Secondary | ICD-10-CM | POA: Diagnosis not present

## 2020-06-24 DIAGNOSIS — L235 Allergic contact dermatitis due to other chemical products: Secondary | ICD-10-CM | POA: Diagnosis not present

## 2020-06-24 DIAGNOSIS — Z961 Presence of intraocular lens: Secondary | ICD-10-CM | POA: Diagnosis not present

## 2020-07-06 DIAGNOSIS — M25561 Pain in right knee: Secondary | ICD-10-CM | POA: Diagnosis not present

## 2020-07-06 DIAGNOSIS — S8002XA Contusion of left knee, initial encounter: Secondary | ICD-10-CM | POA: Diagnosis not present

## 2020-08-20 ENCOUNTER — Other Ambulatory Visit: Payer: Self-pay | Admitting: Obstetrics & Gynecology

## 2020-08-23 ENCOUNTER — Encounter: Payer: Self-pay | Admitting: Obstetrics & Gynecology

## 2020-08-23 ENCOUNTER — Other Ambulatory Visit: Payer: Self-pay

## 2020-08-23 ENCOUNTER — Ambulatory Visit: Payer: Medicare PPO | Admitting: Obstetrics & Gynecology

## 2020-08-23 VITALS — BP 126/82

## 2020-08-23 DIAGNOSIS — R300 Dysuria: Secondary | ICD-10-CM

## 2020-08-23 DIAGNOSIS — R21 Rash and other nonspecific skin eruption: Secondary | ICD-10-CM | POA: Diagnosis not present

## 2020-08-23 DIAGNOSIS — N950 Postmenopausal bleeding: Secondary | ICD-10-CM | POA: Diagnosis not present

## 2020-08-23 NOTE — Progress Notes (Signed)
    Sheila Anderson 1940-08-13 [id number]        80 y.o.  G2P2L2  RP: Vulvar rash/PMB/Dysuria x about a week  HPI: Well using a Milex ring #6 with support pessary, cleaning the pessary herself monthly.  Had mild red blood at the vulva recently, now just a mild brown discharge.  The blood was not associated with removing/inserting the pessary. Abstinent. H/O excision of endometrial polyp 05/2019.  C/O mild Dysuria.  Vulvar rash improved with Clobetasol.  No itching currently.  No fever.   OB History  Gravida Para Term Preterm AB Living  2 2       2   SAB IAB Ectopic Multiple Live Births               # Outcome Date GA Lbr Len/2nd Weight Sex Delivery Anes PTL Lv  2 Para           1 Para             Past medical history,surgical history, problem list, medications, allergies, family history and social history were all reviewed and documented in the EPIC chart.   Directed ROS with pertinent positives and negatives documented in the history of present illness/assessment and plan.  Exam:  Vitals:   08/23/20 1344  BP: 126/82   General appearance:  Normal  Abdomen: Normal  Gynecologic exam: Vulva:  No active bleeding.  No significant rash.  Skin is atrophic with small areas of redness/mild tear close to the urethral meatus.  Speculum:  No erosion or ulceration.  No blood in the vagina and no lesion seen.  U/A: Negative   Assessment/Plan:  80 y.o. G2P2   1. Postmenopausal bleeding Normal gynecologic exam today.  We will complete the investigation with a pelvic ultrasound to evaluate the endometrial lining.  Previous history of an endometrial polyp. - US Transvaginal Non-OB; Future  2. Vulvar rash Improved on clobetasol, will continue until resolved.  3. Dysuria Urine analysis negative, patient reassured. - Urinalysis,Complete w/RFL Culture  [name] MD, 2:10 PM 08/23/2020

## 2020-08-25 LAB — URINALYSIS, COMPLETE W/RFL CULTURE
Bacteria, UA: NONE SEEN /HPF
Bilirubin Urine: NEGATIVE
Glucose, UA: NEGATIVE
Hgb urine dipstick: NEGATIVE
Hyaline Cast: NONE SEEN /LPF
Ketones, ur: NEGATIVE
Nitrites, Initial: NEGATIVE
Protein, ur: NEGATIVE
RBC / HPF: NONE SEEN /HPF (ref 0–2)
Specific Gravity, Urine: 1.01 (ref 1.001–1.03)
pH: 5.5 (ref 5.0–8.0)

## 2020-08-25 LAB — CULTURE INDICATED

## 2020-08-25 LAB — URINE CULTURE
MICRO NUMBER:: 11643499
SPECIMEN QUALITY:: ADEQUATE

## 2020-08-30 ENCOUNTER — Encounter: Payer: Self-pay | Admitting: Obstetrics & Gynecology

## 2020-09-16 ENCOUNTER — Ambulatory Visit (INDEPENDENT_AMBULATORY_CARE_PROVIDER_SITE_OTHER): Payer: Medicare PPO

## 2020-09-16 ENCOUNTER — Encounter: Payer: Self-pay | Admitting: Obstetrics & Gynecology

## 2020-09-16 ENCOUNTER — Other Ambulatory Visit: Payer: Self-pay

## 2020-09-16 ENCOUNTER — Ambulatory Visit: Payer: Medicare PPO | Admitting: Obstetrics & Gynecology

## 2020-09-16 VITALS — BP 122/78

## 2020-09-16 DIAGNOSIS — N950 Postmenopausal bleeding: Secondary | ICD-10-CM

## 2020-09-16 NOTE — Progress Notes (Signed)
    Sheila Anderson [birth year] [id number]        79 y.o.  G2P2L2   RP: PMB for Pelvic US  HPI: Patient presented on 3/14th: Well using a Milex ring #6 with support pessary, cleaning the pessary herself monthly.  Had mild red blood at the vulva recently, now just a mild brown discharge.  The blood was not associated with removing/inserting the pessary. Abstinent. H/O excision of endometrial polyp 05/2019.  C/O mild Dysuria.  Vulvar rash improved with Clobetasol.  No itching currently.  No fever.    OB History  Gravida Para Term Preterm AB Living  2 2       2   SAB IAB Ectopic Multiple Live Births               # Outcome Date GA Lbr Len/2nd Weight Sex Delivery Anes PTL Lv  2 Para           1 Para             Past medical history,surgical history, problem list, medications, allergies, family history and social history were all reviewed and documented in the EPIC chart.   Directed ROS with pertinent positives and negatives documented in the history of present illness/assessment and plan.  Exam:  Vitals:   09/16/20 1632  BP: 122/78   General appearance:  Normal  Pelvic US today: T/V images.  Anteverted uterus atrophic with no myometrial mass measured at 5.27 x 3.76 x 2.1 cm.  The endometrial lining is thin and symmetrical with no mass or thickening seen, it is measured at 1.21 mm.  Both ovaries are atrophic with peripheral calcifications.  The left ovary presents a residual follicle measured at 4 mm.  No adnexal mass seen.  No free fluid in the posterior cul-de-sac.   Assessment/Plan:  80 y.o. G2P2   1. Post-menopausal bleeding Pelvic ultrasound findings reviewed with patient.  Patient reassured that the endometrial lining was normal and very thin at 1.21 mm.  Therefore no evidence of lesions like polyps or fibroids and no hyperplasia or endometrial cancer.  [name] MD, 4:54 PM 09/16/2020

## 2020-09-19 ENCOUNTER — Encounter: Payer: Self-pay | Admitting: Obstetrics & Gynecology

## 2020-09-23 DIAGNOSIS — L258 Unspecified contact dermatitis due to other agents: Secondary | ICD-10-CM | POA: Diagnosis not present

## 2020-10-19 ENCOUNTER — Encounter: Payer: Self-pay | Admitting: Obstetrics & Gynecology

## 2020-10-19 ENCOUNTER — Other Ambulatory Visit: Payer: Self-pay

## 2020-10-19 ENCOUNTER — Ambulatory Visit: Payer: Medicare PPO | Admitting: Obstetrics & Gynecology

## 2020-10-19 VITALS — BP 120/70

## 2020-10-19 DIAGNOSIS — N762 Acute vulvitis: Secondary | ICD-10-CM

## 2020-10-19 MED ORDER — SULFAMETHOXAZOLE-TRIMETHOPRIM 800-160 MG PO TABS: 1.0000 | ORAL_TABLET | Freq: Two times a day (BID) | ORAL | 0 refills | Status: DC

## 2020-10-19 NOTE — Progress Notes (Signed)
    [place] September 20, 1940 [id number]        80 y.o.  G2P2L2  RP: Left vulvar bump  HPI: Left painful vulvar swollen bump with redness x 3 days.  Questions a spider bite as she had a spider in her bed. No drainage.  No fever.    OB History  Gravida Para Term Preterm AB Living  2 2       2   SAB IAB Ectopic Multiple Live Births               # Outcome Date GA Lbr Len/2nd Weight Sex Delivery Anes PTL Lv  2 Para           1 Para             Past medical history,surgical history, problem list, medications, allergies, family history and social history were all reviewed and documented in the EPIC chart.   Directed ROS with pertinent positives and negatives documented in the history of present illness/assessment and plan.  Exam:  Vitals:   10/19/20 0958  BP: 120/70   General appearance:  Normal  Gynecologic exam: Left labia majora with severe oedema and erythema.  No drainage.  No fluctuance currently.     Assessment/Plan:  80 y.o. G2P2   1. Acute vulvitis/Left labia majora infection Probable left labia majora infection.  No evidence of abscess currently.  Questions a spider bite as she had a spider in her bed.  Recommend sitz baths daily.  Will start on Bactrim DS 1 tablet twice a day for 10 days.  Usage reviewed.  F/U in 2-3 days to reassess for abscess and evaluate improvement under antibiotics.  Other orders - sulfamethoxazole-trimethoprim (BACTRIM DS) 800-160 MG tablet; Take 1 tablet by mouth 2 (two) times daily for 10 days.  [name] MD, 10:06 AM 10/19/2020

## 2020-10-20 ENCOUNTER — Telehealth: Payer: Self-pay | Admitting: *Deleted

## 2020-10-20 MED ORDER — CEPHALEXIN 500 MG PO CAPS: 500.0000 mg | ORAL_CAPSULE | Freq: Two times a day (BID) | ORAL | 0 refills | Status: DC

## 2020-10-20 NOTE — Telephone Encounter (Signed)
Patient called to follow up from [place] yesterday reports the Bactrim DS causes nausea, vomiting, fever (100.06) and headache. She asked if medication can be switch to a different antibiotic such as amoxacillin? She has taken this in the past and done well. Please advise

## 2020-10-20 NOTE — Telephone Encounter (Signed)
Dr.Lavoie replied " Change to Keflex 500 mg BID x 10 days.   Message text    Left detailed message on home # per DPR access. Rx sent

## 2020-10-21 ENCOUNTER — Encounter: Payer: Self-pay | Admitting: Obstetrics & Gynecology

## 2020-10-21 ENCOUNTER — Ambulatory Visit: Payer: Medicare PPO | Admitting: Obstetrics & Gynecology

## 2020-10-21 ENCOUNTER — Other Ambulatory Visit: Payer: Self-pay

## 2020-10-21 VITALS — BP 130/70

## 2020-10-21 DIAGNOSIS — N764 Abscess of vulva: Secondary | ICD-10-CM | POA: Diagnosis not present

## 2020-10-21 NOTE — Progress Notes (Signed)
    Sheila Anderson [birth year] [id number]        80 y.o.  G2P2L2  RP: F/U Left vulvar abscess  HPI: Patient could not tolerated the Bactrim DS because of severe nausea, therefore switched to Keflex 500 mg PO BID.  Very tender when doing Sitz baths.  Some mild drainage of pus from the left vulva.  Pessary in place.  Urine/BMs normal.    OB History  Gravida Para Term Preterm AB Living  2 2       2   SAB IAB Ectopic Multiple Live Births               # Outcome Date GA Lbr Len/2nd Weight Sex Delivery Anes PTL Lv  2 Para           1 Para             Past medical history,surgical history, problem list, medications, allergies, family history and social history were all reviewed and documented in the EPIC chart.   Directed ROS with pertinent positives and negatives documented in the history of present illness/assessment and plan.  Exam:  Vitals:   10/21/20 0806  BP: 130/70   General appearance:  Normal  Abdomen: Normal  Gynecologic exam: Severe left Vulvitis with erythema from the anterior to the posterior areas.  Abscess draining at the left posterior vulva.  Culture done.  Betadine prep, Lidocaine 1% local anesthesia.  Lanced with the scalpel.  Mild amount of pus drained.  Swab used to break any pocket.  Cleaned with Peroxide and Saline.  Dressing with 4 x 4 gauzes.   Assessment/Plan:  80 y.o. G2P2   1. Vulvar abscess Left vulvar abscess with spontaneous drainage after sitz baths.  Abscess lanced and drained today.  Patient on Keflex.  Wound cultures done.  Will change antibiotics as needed per wound culture results.  Follow-up in 4 to 5 days for reevaluation.  Precautions reviewed with patient. - Wound culture  [name] MD, 8:22 AM 10/21/2020

## 2020-10-24 LAB — WOUND CULTURE
MICRO NUMBER:: 11882494
SPECIMEN QUALITY:: ADEQUATE

## 2020-10-26 ENCOUNTER — Encounter: Payer: Self-pay | Admitting: Obstetrics & Gynecology

## 2020-10-26 ENCOUNTER — Ambulatory Visit: Payer: Medicare PPO | Admitting: Obstetrics & Gynecology

## 2020-10-26 ENCOUNTER — Other Ambulatory Visit: Payer: Self-pay

## 2020-10-26 VITALS — BP 122/70 | HR 71 | Ht 68.0 in | Wt 138.0 lb

## 2020-10-26 DIAGNOSIS — N764 Abscess of vulva: Secondary | ICD-10-CM | POA: Diagnosis not present

## 2020-10-26 DIAGNOSIS — Z22322 Carrier or suspected carrier of Methicillin resistant Staphylococcus aureus: Secondary | ICD-10-CM

## 2020-10-26 MED ORDER — CEPHALEXIN 500 MG PO CAPS: 500.0000 mg | ORAL_CAPSULE | Freq: Two times a day (BID) | ORAL | 0 refills | Status: AC

## 2020-10-26 NOTE — Progress Notes (Signed)
    Sheila Anderson 05-16-1941 [id number]        79 y.o.  G2P2L2  RP: F/U Left vulvar abscess   HPI: On Keflex with abscess drained last week.  Wound culture positive to MRSA.  Patient feels much better, pain decreased and redness gone.  Minimal drainage at small wound opening.  No fever.     OB History  Gravida Para Term Preterm AB Living  2 2       2   SAB IAB Ectopic Multiple Live Births               # Outcome Date GA Lbr Len/2nd Weight Sex Delivery Anes PTL Lv  2 Para           1 Para             Past medical history,surgical history, problem list, medications, allergies, family history and social history were all reviewed and documented in the EPIC chart.   Directed ROS with pertinent positives and negatives documented in the history of present illness/assessment and plan.  Exam:  Vitals:   10/26/20 0822  BP: 122/70  Pulse: 71  SpO2: 99%  Weight: 138 lb (62.6 kg)  Height: 5\' 8"  (1.727 m)   General appearance:  Normal  Gynecologic exam: Left vulva:  No erythema anymore.  Induration decreased, less tender.  No drainage.  Wound culture:  MRSA positive.   Assessment/Plan:  80 y.o. G2P2   1. Vulvar abscess Left vulvar abscess with MRSA culture positive.  Much improved post drainage on Keflex.  Continue Keflex for another 7 days.  Continue sitz baths.  F/U in 1 week.  Precautions associated with MRSA discussed to prevent spread of that bacteria.  2. MRSA (methicillin resistant staph aureus) culture positive Responding to drainage and Keflex.  Had intolerance to Bactrim.  Will continue Keflex treatment for another 7 days.  If not improving anymore on Keflex, will switch to Bactrim DS and give Zofran for nausea as patient was intolerant to it earlier.  Other orders - tolterodine (DETROL) 1 MG tablet - cephALEXin (KEFLEX) 500 MG capsule; Take 1 capsule (500 mg total) by mouth 2 (two) times daily for 7 days. Continue treatment for another 7 days.  Sheila Bruins  MD, 8:34 AM 10/26/2020

## 2020-11-03 ENCOUNTER — Encounter: Payer: Self-pay | Admitting: Obstetrics & Gynecology

## 2020-11-03 ENCOUNTER — Ambulatory Visit: Payer: Medicare PPO | Admitting: Obstetrics & Gynecology

## 2020-11-03 ENCOUNTER — Other Ambulatory Visit: Payer: Self-pay

## 2020-11-03 VITALS — BP 122/80

## 2020-11-03 DIAGNOSIS — N764 Abscess of vulva: Secondary | ICD-10-CM

## 2020-11-03 DIAGNOSIS — Z22322 Carrier or suspected carrier of Methicillin resistant Staphylococcus aureus: Secondary | ICD-10-CM

## 2020-11-03 NOTE — Progress Notes (Signed)
    Sheila Anderson 05/27/1941 [id number]        79 y.o.  G2P2   RP:  F/U Left vulvar abscess   HPI: On Keflex, 3 days of treatment left. Wound culture positive to MRSA.  Patient feels completely well, no longer having vulvar pain/discomfort.  No drainage, no redness or induration. No fever.     OB History  Gravida Para Term Preterm AB Living  2 2       2   SAB IAB Ectopic Multiple Live Births               # Outcome Date GA Lbr Len/2nd Weight Sex Delivery Anes PTL Lv  2 Para           1 Para             Past medical history,surgical history, problem list, medications, allergies, family history and social history were all reviewed and documented in the EPIC chart.   Directed ROS with pertinent positives and negatives documented in the history of present illness/assessment and plan.  Exam:  Vitals:   11/03/20 1120  BP: 122/80   General appearance:  Normal  Gynecologic exam: Vulva:  Normal.  Resolved left vulvar abscess.  No erythema or induration.  Non-tender.   Assessment/Plan:  80 y.o. G2P2   1. Vulvar abscess Resolved.  Will finish Keflex ABTx in 3 days.  2. MRSA (methicillin resistant staph aureus) culture positive Precautions reviewed.  [name] MD, 11:49 AM 11/03/2020

## 2020-11-08 ENCOUNTER — Encounter: Payer: Self-pay | Admitting: Obstetrics & Gynecology

## 2020-12-01 ENCOUNTER — Encounter: Payer: Self-pay | Admitting: Obstetrics & Gynecology

## 2020-12-01 ENCOUNTER — Other Ambulatory Visit: Payer: Self-pay

## 2020-12-01 ENCOUNTER — Ambulatory Visit: Payer: Medicare PPO | Admitting: Obstetrics & Gynecology

## 2020-12-01 VITALS — BP 130/70

## 2020-12-01 DIAGNOSIS — R300 Dysuria: Secondary | ICD-10-CM

## 2020-12-01 DIAGNOSIS — R3915 Urgency of urination: Secondary | ICD-10-CM

## 2020-12-01 DIAGNOSIS — N898 Other specified noninflammatory disorders of vagina: Secondary | ICD-10-CM | POA: Diagnosis not present

## 2020-12-01 DIAGNOSIS — N813 Complete uterovaginal prolapse: Secondary | ICD-10-CM | POA: Diagnosis not present

## 2020-12-01 LAB — WET PREP FOR TRICH, YEAST, CLUE

## 2020-12-01 MED ORDER — TOLTERODINE TARTRATE 1 MG PO TABS: 1.0000 mg | ORAL_TABLET | Freq: Every day | ORAL | 4 refills | Status: DC

## 2020-12-01 MED ORDER — SULFAMETHOXAZOLE-TRIMETHOPRIM 800-160 MG PO TABS: 1.0000 | ORAL_TABLET | Freq: Two times a day (BID) | ORAL | 0 refills | Status: DC

## 2020-12-01 NOTE — Progress Notes (Addendum)
    Sheila Anderson 1941-04-30 [id number]        79 y.o.  G2P2L2  RP: Vulvar rash and vaginal discharge  HPI: Pessary in place for complete uterine prolapse with cystocele.  Patient complains of a vaginal discharge with discomfort when urinating.  No vaginal bleeding.  No pelvic pain.  No fever.   OB History  Gravida Para Term Preterm AB Living  2 2       2   SAB IAB Ectopic Multiple Live Births               # Outcome Date GA Lbr Len/2nd Weight Sex Delivery Anes PTL Lv  2 Para           1 Para             Past medical history,surgical history, problem list, medications, allergies, family history and social history were all reviewed and documented in the EPIC chart.   Directed ROS with pertinent positives and negatives documented in the history of present illness/assessment and plan.  Exam:  Vitals:   12/01/20 0909  BP: 130/70   General appearance:  Normal  Abdomen: Normal  Gynecologic exam: Vulva:  Mild irritation at the perineum and perianal area.  Pessary removed and cleaned.  Mild vaginal discharge.  Speculum:  Vaginal mucosa mildly irritated, but intact without erosion or ulcer.  Wet prep done.    Wet prep Negative  U/A: Yellow cloudy, protein negative, nitrites negative, white blood cells 40-60, red blood cells negative, bacteria many.  Urine culture pending.   Assessment/Plan:  80 y.o. G2P2   1. Dysuria Abnormal urine analysis.  Pending urine culture.  Decision to treat with nitrofurantoin 100 mg per mouth twice a day for 7 days. (Intolerance to Bactrim DS with N/V)  Prescription sent to pharmacy.   - Urinalysis,Complete w/RFL Culture  2. Vaginal discharge Wet prep negative.  Patient reassured. - WET PREP FOR [place], YEAST, CLUE  3. Complete uterine prolapse with prolapse of anterior vaginal wall Good fit with pessary.  No erosion or ulceration of the vaginal mucosa.  Pessary cleaned and put back in place.  4. Urgency of urination Detrol  represcribed.  Other orders - tolterodine (DETROL) 1 MG tablet; Take 1 tablet (1 mg total) by mouth daily. - Urine Culture - REFLEXIVE URINE CULTURE - nitrofurantoin, macrocrystal-monohydrate, (MACROBID) 100 MG capsule; Take 1 capsule (100 mg total) by mouth 2 (two) times daily for 7 days.   [name] MD, 9:16 AM 12/01/2020

## 2020-12-02 ENCOUNTER — Telehealth: Payer: Self-pay

## 2020-12-02 ENCOUNTER — Other Ambulatory Visit: Payer: Self-pay

## 2020-12-02 LAB — URINALYSIS, COMPLETE W/RFL CULTURE
Bilirubin Urine: NEGATIVE
Glucose, UA: NEGATIVE
Hyaline Cast: NONE SEEN /LPF
Ketones, ur: NEGATIVE
Nitrites, Initial: NEGATIVE
Protein, ur: NEGATIVE
RBC / HPF: NONE SEEN /HPF (ref 0–2)
Specific Gravity, Urine: 1.02 (ref 1.001–1.035)
pH: 6.5 (ref 5.0–8.0)

## 2020-12-02 LAB — URINE CULTURE
MICRO NUMBER:: 12037214
Result:: NO GROWTH
SPECIMEN QUALITY:: ADEQUATE

## 2020-12-02 LAB — CULTURE INDICATED

## 2020-12-02 MED ORDER — NITROFURANTOIN MONOHYD MACRO 100 MG PO CAPS: 100.0000 mg | ORAL_CAPSULE | Freq: Two times a day (BID) | ORAL | 0 refills | Status: AC

## 2020-12-02 NOTE — Telephone Encounter (Signed)
Patient informed. Medication added to allergy list.

## 2020-12-02 NOTE — Telephone Encounter (Signed)
Patient was in yesterday with UTI sx and was prescribed generic Bactrim DS. She called today to say this is a medication "she cannot tolerate and had a bad reaction to it last time". She is requesting a different medication.

## 2020-12-05 ENCOUNTER — Encounter: Payer: Self-pay | Admitting: Obstetrics & Gynecology

## 2020-12-09 ENCOUNTER — Encounter: Payer: Self-pay | Admitting: Obstetrics & Gynecology

## 2020-12-09 ENCOUNTER — Ambulatory Visit (INDEPENDENT_AMBULATORY_CARE_PROVIDER_SITE_OTHER): Payer: Medicare PPO | Admitting: Obstetrics & Gynecology

## 2020-12-09 ENCOUNTER — Other Ambulatory Visit: Payer: Self-pay

## 2020-12-09 ENCOUNTER — Telehealth: Payer: Self-pay

## 2020-12-09 VITALS — BP 116/72

## 2020-12-09 DIAGNOSIS — R300 Dysuria: Secondary | ICD-10-CM | POA: Diagnosis not present

## 2020-12-09 DIAGNOSIS — L292 Pruritus vulvae: Secondary | ICD-10-CM | POA: Diagnosis not present

## 2020-12-09 DIAGNOSIS — Z4689 Encounter for fitting and adjustment of other specified devices: Secondary | ICD-10-CM

## 2020-12-09 MED ORDER — FLUCONAZOLE 150 MG PO TABS: 150.0000 mg | ORAL_TABLET | Freq: Every day | ORAL | 1 refills | Status: AC

## 2020-12-09 NOTE — Progress Notes (Signed)
    Sheila Anderson 01/18/1941 [id number]        80 y.o.  G2P2L2  RP: Discomfort with urination/vulvar itching  HPI: Milex ring #6 with support.  Patient started removing the pessary at night and is now having less vaginal discharge.  Had a little bleeding when removed the pessary.  Still having discomfort with urination, but U. Culture showed no growth on 12/01/2020.  Vulvar itching posteriorly.   OB History  Gravida Para Term Preterm AB Living  2 2       2   SAB IAB Ectopic Multiple Live Births               # Outcome Date GA Lbr Len/2nd Weight Sex Delivery Anes PTL Lv  2 Para           1 Para             Past medical history,surgical history, problem list, medications, allergies, family history and social history were all reviewed and documented in the EPIC chart.   Directed ROS with pertinent positives and negatives documented in the history of present illness/assessment and plan.  Exam:  Vitals:   12/09/20 1551  BP: 116/72   General appearance:  Normal  Abdomen: Normal  Gynecologic exam: Posterior vulva with erythema and irritation.  Speculum:  Cervix/Vaginal mucosa intact, no erosion or ulcer.  Pessary put back in place.  Good fit.  U/A:  Yellow clear, Nit Neg, WBC 10-20, RBC 0-2, Bacteria few, Urine culture pending.  Urine culture 12/01/2020:  No growth   Assessment/Plan:  80 y.o. G2P2   1. Dysuria Mild discomfort with urination.  Probably not an acute cystitis.  Urine analysis minimally perturbed.  Last urine culture 12-01-2020 showed no growth.  Will wait on urine culture to decide if treatment is needed.  Recommend pushing water intake.  Avoid caffeine products. - Urinalysis,Complete w/RFL Culture  2. Vulvar itching Vulvar itching with irritation at the posterior vulva, probably due to to previous vaginal discharge irritating.  Will treat for yeast with fluconazole 150 mg daily for 3 days.  Usage reviewed and prescription sent to pharmacy.  3. Pessary  maintenance  Continue with removal of pessary at night.  Patient doing better with that and having less vaginal discharge.  Other orders - Urine Culture - REFLEXIVE URINE CULTURE - fluconazole (DIFLUCAN) 150 MG tablet; Take 1 tablet (150 mg total) by mouth daily for 3 days.   [name] MD, 4:07 PM 12/09/2020

## 2020-12-09 NOTE — Telephone Encounter (Signed)
At [facility] request, on behalf of Dr. Dellis Filbert, I called patient and left message asking her to call me as she is scheduled for appointment with Dr. Marguerita Merles this afternoon for UTI.  She should have just finished Macrobid yesterday.

## 2020-12-11 LAB — URINALYSIS, COMPLETE W/RFL CULTURE
Bilirubin Urine: NEGATIVE
Glucose, UA: NEGATIVE
Hyaline Cast: NONE SEEN /LPF
Nitrites, Initial: NEGATIVE
Specific Gravity, Urine: 1.02 (ref 1.001–1.035)
pH: 7 (ref 5.0–8.0)

## 2020-12-11 LAB — URINE CULTURE
MICRO NUMBER:: 12069861
SPECIMEN QUALITY:: ADEQUATE

## 2020-12-11 LAB — CULTURE INDICATED

## 2020-12-20 DIAGNOSIS — L258 Unspecified contact dermatitis due to other agents: Secondary | ICD-10-CM | POA: Diagnosis not present

## 2020-12-24 ENCOUNTER — Other Ambulatory Visit: Payer: Self-pay

## 2020-12-24 ENCOUNTER — Ambulatory Visit: Payer: Medicare PPO | Admitting: Obstetrics & Gynecology

## 2020-12-24 ENCOUNTER — Encounter: Payer: Self-pay | Admitting: Obstetrics & Gynecology

## 2020-12-24 VITALS — BP 130/80

## 2020-12-24 DIAGNOSIS — R300 Dysuria: Secondary | ICD-10-CM | POA: Diagnosis not present

## 2020-12-24 DIAGNOSIS — N764 Abscess of vulva: Secondary | ICD-10-CM | POA: Diagnosis not present

## 2020-12-24 MED ORDER — CEPHALEXIN 500 MG PO CAPS: 500.0000 mg | ORAL_CAPSULE | Freq: Three times a day (TID) | ORAL | 0 refills | Status: AC

## 2020-12-24 NOTE — Progress Notes (Signed)
    Sheila Anderson April 18, 1941 [id number]        80 y.o.  G2P2L2 married   RP: Dysuria and recurrence of a Rt vulvar abscess  HPI: Discomfort with urination.  No pelvic pain.  Recurrence of a Rt vulvar abscess.  Had a Rt vulvar abscess positive for MRSA 10/21/2020.   OB History  Gravida Para Term Preterm AB Living  2 2       2   SAB IAB Ectopic Multiple Live Births               # Outcome Date GA Lbr Len/2nd Weight Sex Delivery Anes PTL Lv  2 Para           1 Para             Past medical history,surgical history, problem list, medications, allergies, family history and social history were all reviewed and documented in the EPIC chart.   Directed ROS with pertinent positives and negatives documented in the history of present illness/assessment and plan.  Exam:  Vitals:   12/24/20 1217  BP: 130/80   General appearance:  Normal  CVAT Neg bilaterally  Abdomen: Normal  Gynecologic exam: Rt vulvar abscess drained as the culture was done with a swab.  Pus +++.    U/A: Cloudy yellow, Nitrite Neg, Prtn Neg, WBC 40-60, RBC Neg, Bacteria many.  Urine culture pending.   Assessment/Plan:  80 y.o. G2P2   1. Vulvar abscess Right vulvar abscess drained.  Culture done (will test for MRSA and ESBL Enterobacteria).  Had a MRSA vulvar abscess recently.  Will treat with Bactrim DS x 14 days (Had the ABTX at home already).  Doesn't usually feel good on it, tiredness, but no allergy.  Keflex prescription sent in case she doesn't tolerate the Bactrim DS at all.  F/U reevaluation in 1 week. - Wound culture  2. Dysuria U/A abnormal.  Urine Culture pending.  Will treat with Bactrim DS (For probable MRSA Vulvar abscess as well).  Other orders - cephALEXin (KEFLEX) 500 MG capsule; Take 1 capsule (500 mg total) by mouth 3 (three) times daily for 7 days.   [name] MD, 12:40 PM 12/24/2020

## 2020-12-26 ENCOUNTER — Encounter: Payer: Self-pay | Admitting: Obstetrics & Gynecology

## 2020-12-26 LAB — URINALYSIS, COMPLETE W/RFL CULTURE
Bilirubin Urine: NEGATIVE
Glucose, UA: NEGATIVE
Hgb urine dipstick: NEGATIVE
Hyaline Cast: NONE SEEN /LPF
Ketones, ur: NEGATIVE
Nitrites, Initial: NEGATIVE
Protein, ur: NEGATIVE
RBC / HPF: NONE SEEN /HPF (ref 0–2)
Specific Gravity, Urine: 1.02 (ref 1.001–1.035)
pH: 7 (ref 5.0–8.0)

## 2020-12-26 LAB — URINE CULTURE
MICRO NUMBER:: 12124780
SPECIMEN QUALITY:: ADEQUATE

## 2020-12-26 LAB — CULTURE INDICATED

## 2020-12-27 LAB — WOUND CULTURE
MICRO NUMBER:: 12124761
SPECIMEN QUALITY:: ADEQUATE

## 2020-12-31 ENCOUNTER — Ambulatory Visit: Payer: Medicare PPO | Admitting: Obstetrics & Gynecology

## 2020-12-31 ENCOUNTER — Other Ambulatory Visit: Payer: Self-pay

## 2020-12-31 ENCOUNTER — Encounter: Payer: Self-pay | Admitting: Obstetrics & Gynecology

## 2020-12-31 VITALS — BP 114/76

## 2020-12-31 DIAGNOSIS — L292 Pruritus vulvae: Secondary | ICD-10-CM

## 2020-12-31 DIAGNOSIS — N764 Abscess of vulva: Secondary | ICD-10-CM

## 2020-12-31 MED ORDER — FLUCONAZOLE 150 MG PO TABS: 150.0000 mg | ORAL_TABLET | Freq: Every day | ORAL | 0 refills | Status: AC

## 2020-12-31 MED ORDER — CIPROFLOXACIN HCL 500 MG PO TABS: 500.0000 mg | ORAL_TABLET | Freq: Two times a day (BID) | ORAL | 0 refills | Status: DC

## 2020-12-31 NOTE — Progress Notes (Signed)
    Sheila Anderson 02-07-1941 [id number]        80 y.o.  G2P2   RP: F/U Recurrent MRSA Rt Vulvar Abscess  HPI: Very good response to drainage and Keflex treatment.  Abscess culture came back positive for MRSA.  U. Culture no Uropathogen.  Warm water soaking many times a day.  Resolved vulvar abscess.  No lump.  No pain/tenderness.  C/O vulvar and perianal itching.  No fever.   OB History  Gravida Para Term Preterm AB Living  '2 2       2  '$ SAB IAB Ectopic Multiple Live Births               # Outcome Date GA Lbr Len/2nd Weight Sex Delivery Anes PTL Lv  2 Para           1 Para             Past medical history,surgical history, problem list, medications, allergies, family history and social history were all reviewed and documented in the EPIC chart.   Directed ROS with pertinent positives and negatives documented in the history of present illness/assessment and plan.  Exam:  Vitals:   12/31/20 1152  BP: 114/76   General appearance:  Normal  Gynecologic exam: Vulva:  Completely resolved Rt vulvar abscess.  Mild perianal irritation.   Assessment/Plan:  80 y.o. G2P2   1. Vulvar abscess Recurrence of a right vulvar abscess positive for MRSA.  The abscess is completely resolved on Keflex.  Given that it is a recurrence of MRSA, will add a treatment with ciprofloxacin 500 mg per mouth twice a day for 5 days.  The wound culture showed excellent sensitivity to Cipro.  Usage reviewed and prescription sent to pharmacy.  2. Vulvar itching Vulvar itching and irritation.  Patient will use a cream of corticosteroid with an Antifungal agent.  Fluconazole prescription also sent to pharmacy, will take it after finishing the antibiotics.  Usage reviewed and prescription sent to pharmacy.  Other orders - diphenhydrAMINE HCl (BENADRYL PO); Take by mouth. - ciprofloxacin (CIPRO) 500 MG tablet; Take 1 tablet (500 mg total) by mouth 2 (two) times daily for 5 days. - fluconazole (DIFLUCAN) 150 MG  tablet; Take 1 tablet (150 mg total) by mouth daily for 3 days.   [name] MD, 12:23 PM 12/31/2020

## 2021-01-01 ENCOUNTER — Encounter: Payer: Self-pay | Admitting: Obstetrics & Gynecology

## 2021-01-03 ENCOUNTER — Telehealth: Payer: Self-pay

## 2021-01-03 MED ORDER — CEPHALEXIN 500 MG PO CAPS: 500.0000 mg | ORAL_CAPSULE | Freq: Three times a day (TID) | ORAL | 0 refills | Status: DC

## 2021-01-03 NOTE — Telephone Encounter (Signed)
Per Dr. Marguerita Merles "[facility], add 5 days of treatment with Cephalexin. "

## 2021-01-03 NOTE — Telephone Encounter (Signed)
Spoke with patient and informed her. Rx sent. 

## 2021-01-03 NOTE — Telephone Encounter (Signed)
Patient called stating she was not comfortable taking the Cipro because of the reported adverse reactions.  She states also her age 80. She was concerned about "can cause irritation to tendons and even torn tendons, can cause joint pain and cause nerve problems in extremities".  She said she prefers Cefalexin as she can take that with no problems.

## 2021-01-14 ENCOUNTER — Telehealth: Payer: Self-pay | Admitting: *Deleted

## 2021-01-14 DIAGNOSIS — R21 Rash and other nonspecific skin eruption: Secondary | ICD-10-CM | POA: Diagnosis not present

## 2021-01-14 NOTE — Telephone Encounter (Addendum)
Pt complains of anal itching,dysuria,vaginal irritation, as well as rash/hives around eyes and face associated with itching. Patient states she has had this rash before was treated with fluocinonide cream (LIDEX) 0.05 %; Apply 1 application topically 2 (two) times daily in the past by DR.SAGARDIA, MIGUEL JOSE and was referred to a dermatologist. Pt would like [place] to see her today or referred to someone who can help. I explained we don't have any appointments today. Possible treatment over phone. I will route this to DR. Lavoie for advice/ recommendations.

## 2021-01-14 NOTE — Telephone Encounter (Signed)
Recommendations per Dr. Dellis Filbert: due to [place] not having an appointment avalible today please go to urgent care to be treated for anal itching,dysuria,vaginal irritation. Please see your dermatologist to discuss rash.  I spoke with patient she is aware of recommendations/advice.

## 2021-02-08 ENCOUNTER — Other Ambulatory Visit: Payer: Self-pay

## 2021-02-08 ENCOUNTER — Ambulatory Visit: Payer: Medicare PPO | Admitting: Obstetrics and Gynecology

## 2021-02-08 ENCOUNTER — Encounter: Payer: Self-pay | Admitting: Obstetrics and Gynecology

## 2021-02-08 VITALS — BP 120/64 | HR 104 | Ht 68.0 in | Wt 131.0 lb

## 2021-02-08 DIAGNOSIS — L0231 Cutaneous abscess of buttock: Secondary | ICD-10-CM

## 2021-02-08 NOTE — Progress Notes (Signed)
GYNECOLOGY  VISIT   HPI: 80 y.o.   Married White or Caucasian Not Hispanic or Latino  female   G2P2 with No LMP recorded. Patient is postmenopausal.   here for a vulvar abscess she is taking Bactrim. She saw Dr. Dellis Filbert on 7/22 for a different abscess on her vulva. Today she is here for one on her right buttock.  Notes reviewed. The patient has a h/o recurrent MRSA vulvar abscess. She had good response to drainage and Keflex. Since it was a recurrence, Dr Dellis Filbert also treated her with cipro for 5 days (wound culture sensitive to Cipro).  Current abscess in on her right buttock, started on 01/27/21. She also has something on her left vulva.  The abscess on her right buttock started as a red dot, it got big and painful after 02/02/21. It started draining a week ago, it is getting better but is painfully sore.  She had some left over bactrim that she started this morning.   This is her second abscess since May of the year.   GYNECOLOGIC HISTORY: No LMP recorded. Patient is postmenopausal. Contraception:PMP Menopausal hormone therapy: none         OB History     Gravida  2   Para  2   Term      Preterm      AB      Living  2      SAB      IAB      Ectopic      Multiple      Live Births                 Patient Active Problem List   Diagnosis Date Noted   S/P shoulder replacement 03/10/2016   Lumbosacral radiculopathy 02/09/2016    Past Medical History:  Diagnosis Date   Anxiety    Chronic cough    05-29-2019  per pt due to allergies and reflux   Cystocele with uterine prolapse    DDD (degenerative disc disease), lumbar    Eczema    Endometrial polyp    GERD (gastroesophageal reflux disease)    Hiatal hernia    History of colon polyps    History of echocardiogram    03-26-2015   ef 60-65%   History of exercise stress test    03-26-2015 in epic ,  unable to rule out ischemia due to poor exercise   History of gastric polyp    01-17-2019   duodenol polyp  (low grade hyperplasia)   History of vertebral compression fracture    2017 -- L1   OA (osteoarthritis)    OAB (overactive bladder)    PMB (postmenopausal bleeding)    Pulmonary nodule    05-29-2019 was told seen in CT imaging 12-11-2018   Rash    eyelids   Seasonal allergies     Past Surgical History:  Procedure Laterality Date   CATARACT EXTRACTION W/ INTRAOCULAR LENS  IMPLANT, BILATERAL  2015  approx.   CYSTOSCOPY  2010   approx.   w/ removal bladder cyst  (per pt benign)   DILATATION & CURETTAGE/HYSTEROSCOPY WITH MYOSURE N/A 05/30/2019   Procedure: [place];  Surgeon: [name], MD;  Location: [place];  Service: Gynecology;  Laterality: N/A;   ESOPHAGOGASTRODUODENOSCOPY (EGD) WITH PROPOFOL N/A 01/17/2019   Procedure: ESOPHAGOGASTRODUODENOSCOPY (EGD) WITH PROPOFOL;  Surgeon: Carol Ada, MD;  Location: [facility];  Service: Endoscopy;  Laterality: N/A;   EYE  SURGERY Left 07-26-2006   '@[facility]'$    for preretinal fibrosis   HEMOSTASIS CLIP PLACEMENT  01/17/2019   Procedure: HEMOSTASIS CLIP PLACEMENT;  Surgeon: Carol Ada, MD;  Location: [facility];  Service: Endoscopy;;   LAPAROSCOPIC PARTIAL RIGHT COLECTOMY  02-04-2010   dr Redmond Pulling '@[facility]'$    large polyps   POLYPECTOMY  01/17/2019   Procedure: POLYPECTOMY;  Surgeon: Carol Ada, MD;  Location: [facility];  Service: Endoscopy;;   REVERSE SHOULDER ARTHROPLASTY Right 03/10/2016   Procedure: RIGHT REVERSE SHOULDER ARTHROPLASTY;  Surgeon: Netta Cedars, MD;  Location: [place];  Service: Orthopedics;  Laterality: Right;   SUBMUCOSAL LIFTING INJECTION  01/17/2019   Procedure: SUBMUCOSAL LIFTING INJECTION;  Surgeon: Carol Ada, MD;  Location: [facility];  Service: Endoscopy;;    Current Outpatient Medications  Medication Sig Dispense Refill   alendronate (FOSAMAX) 70 MG tablet Take 1 tablet (70 mg total) by mouth once a week. Take with a full glass of water on an empty  stomach. 12 tablet 3   Ascorbic Acid (VITAMIN C) 1000 MG tablet Take 1,000 mg by mouth daily as needed (immune support).     Calcium Carb-Cholecalciferol 500-400 MG-UNIT CHEW Chew 1 tablet by mouth daily.      calcium carbonate (TUMS - DOSED IN MG ELEMENTAL CALCIUM) 500 MG chewable tablet Chew 2 tablets by mouth daily as needed for indigestion or heartburn.      chlorpheniramine (CHLOR-TRIMETON) 4 MG tablet Take 4 mg by mouth every 4 (four) hours as needed for allergies.     clobetasol ointment (TEMOVATE) [id number] % Apply 1 application topically at bedtime. Thin layer daily at bedtime at the affected vulva and perianally x 2 weeks, then twice weekly. 30 g 3   COD LIVER OIL PO Take 415 mg by mouth daily.      Coenzyme Q10 (COQ-10) 100 MG CAPS Take 100 mg by mouth daily.     diphenhydrAMINE HCl (BENADRYL PO) Take by mouth.     Glucos-MSM-C-Mn-Ginger-Willow (GLUCOSAMINE MSM COMPLEX PO) Take 1 capsule by mouth daily.     Multiple Vitamins-Minerals (ONE-A-DAY WOMENS 50 PLUS PO) Take 1 tablet by mouth daily.      sulfamethoxazole-trimethoprim (BACTRIM DS) 800-160 MG tablet Take 1 tablet by mouth 2 (two) times daily.     tolterodine (DETROL) 1 MG tablet Take 1 tablet (1 mg total) by mouth daily. 90 tablet 4   vitamin E 400 UNIT capsule Take 400 Units by mouth daily as needed (leg cramps).     No current facility-administered medications for this visit.     ALLERGIES: Robaxin [methocarbamol], Colophony [pinus strobus], Gold-containing drug products, Mobic [meloxicam], and Nickel  Family History  Problem Relation Age of Onset   Heart disease Mother    Hypertension Mother    Stroke Mother    Congestive Heart Failure Father    Heart failure Father    Multiple sclerosis Sister     Social History   Socioeconomic History   Marital status: Married    Spouse name: Not on file   Number of children: 2   Years of education: Masters   Highest education level: Not on file  Occupational History    Occupation: [employer]   Occupation: Art therapist  Tobacco Use   Smoking status: Never   Smokeless tobacco: Never  Vaping Use   Vaping Use: Never used  Substance and Sexual Activity   Alcohol use: Yes    Alcohol/week: 2.0 - 3.0 standard drinks    Types: 2 - 3 Standard  drinks or equivalent per week    Comment: occasionally   Drug use: Never   Sexual activity: Not Currently    Birth control/protection: Post-menopausal  Other Topics Concern   Not on file  Social History Narrative   Lives at home with husband, [name].   Right-handed.   1-2 cups caffeine per day.   Social Determinants of Health   Financial Resource Strain: Not on file  Food Insecurity: Not on file  Transportation Needs: Not on file  Physical Activity: Not on file  Stress: Not on file  Social Connections: Not on file  Intimate Partner Violence: Not on file    ROS  PHYSICAL EXAMINATION:    BP 120/64   Pulse (!) 104   Ht '5\' 8"'$  (1.727 m)   Wt 131 lb (59.4 kg)   SpO2 99%   BMI 19.92 kg/m     General appearance: alert, cooperative and appears stated age  Pelvic: External genitalia:  small epidermoid cyst on the right labia majora and an ~ 1 cm cyst palpated under the skin on the left vulva.               Urethra:  normal appearing urethra with no masses, tenderness or lesions              Bartholins and Skenes: normal                  Right buttock: ~9 x 7 cm area of induration, erythema, skin is open in a few areas in the center, pus seen under the skin  Chaperone was present for exam.  1. Abscess of right buttock Will send to surgery for I&D

## 2021-03-07 DIAGNOSIS — N764 Abscess of vulva: Secondary | ICD-10-CM | POA: Diagnosis not present

## 2021-03-15 DIAGNOSIS — N907 Vulvar cyst: Secondary | ICD-10-CM | POA: Diagnosis not present

## 2021-04-11 ENCOUNTER — Ambulatory Visit: Payer: Medicare PPO | Admitting: Obstetrics & Gynecology

## 2021-05-12 DIAGNOSIS — N764 Abscess of vulva: Secondary | ICD-10-CM | POA: Diagnosis not present

## 2021-05-23 ENCOUNTER — Ambulatory Visit (INDEPENDENT_AMBULATORY_CARE_PROVIDER_SITE_OTHER): Payer: Medicare PPO | Admitting: Obstetrics & Gynecology

## 2021-05-23 ENCOUNTER — Encounter: Payer: Self-pay | Admitting: Obstetrics & Gynecology

## 2021-05-23 ENCOUNTER — Other Ambulatory Visit: Payer: Self-pay

## 2021-05-23 VITALS — BP 124/82 | HR 65 | Ht 64.75 in | Wt 134.0 lb

## 2021-05-23 DIAGNOSIS — Z9289 Personal history of other medical treatment: Secondary | ICD-10-CM

## 2021-05-23 DIAGNOSIS — Z9189 Other specified personal risk factors, not elsewhere classified: Secondary | ICD-10-CM

## 2021-05-23 DIAGNOSIS — N762 Acute vulvitis: Secondary | ICD-10-CM

## 2021-05-23 DIAGNOSIS — Z01419 Encounter for gynecological examination (general) (routine) without abnormal findings: Secondary | ICD-10-CM

## 2021-05-23 DIAGNOSIS — Z780 Asymptomatic menopausal state: Secondary | ICD-10-CM

## 2021-05-23 DIAGNOSIS — R3915 Urgency of urination: Secondary | ICD-10-CM

## 2021-05-23 DIAGNOSIS — M810 Age-related osteoporosis without current pathological fracture: Secondary | ICD-10-CM | POA: Diagnosis not present

## 2021-05-23 MED ORDER — CLOBETASOL PROPIONATE 0.05 % EX OINT
1.0000 | TOPICAL_OINTMENT | Freq: Two times a day (BID) | CUTANEOUS | 3 refills | Status: AC
Start: 2021-05-23 — End: 2021-06-06

## 2021-05-23 MED ORDER — TOLTERODINE TARTRATE 1 MG PO TABS: 1.0000 mg | ORAL_TABLET | Freq: Every day | ORAL | 4 refills | Status: DC

## 2021-05-23 NOTE — Progress Notes (Addendum)
Sheila Anderson 12-23-1940 [id number]   History:    80 y.o. G2P2L2  RP:  Established patient presenting for annual gyn exam   HPI: Postmenopause, well on no HRT.  No PMB.  Mild vaginal discharge when using her pessary.  Vulvar irritation with burning.  Small sebaceous cyst, non-tender on the Rt vulva.  Urinary urgency controlled with Detrol.  Pap Neg 08/2016, no h/o abnormal Pap.  Abstinent.  Breasts normal.  Overdue for Mammo, will schedule at [facility].  BD 08/2018 Osteoporosis, will schedule at [facility] now.  [place] [id number].  Health labs with Dr Mitchel Honour.  BMI 22.47.  Past medical history,surgical history, family history and social history were all reviewed and documented in the EPIC chart.  Gynecologic History No LMP recorded. Patient is postmenopausal.  Obstetric History OB History  Gravida Para Term Preterm AB Living  2 2       2   SAB IAB Ectopic Multiple Live Births               # Outcome Date GA Lbr Len/2nd Weight Sex Delivery Anes PTL Lv  2 Para           1 Para              ROS: A ROS was performed and pertinent positives and negatives are included in the history. GENERAL: No fevers or chills. HEENT: No change in vision, no earache, sore throat or sinus congestion. NECK: No pain or stiffness. CARDIOVASCULAR: No chest pain or pressure. No palpitations. PULMONARY: No shortness of breath, cough or wheeze. GASTROINTESTINAL: No abdominal pain, nausea, vomiting or diarrhea, melena or bright red blood per rectum. GENITOURINARY: No urinary frequency, urgency, hesitancy or dysuria. MUSCULOSKELETAL: No joint or muscle pain, no back pain, no recent trauma. DERMATOLOGIC: No rash, no itching, no lesions. ENDOCRINE: No polyuria, polydipsia, no heat or cold intolerance. No recent change in weight. HEMATOLOGICAL: No anemia or easy bruising or bleeding. NEUROLOGIC: No headache, seizures, numbness, tingling or weakness. PSYCHIATRIC: No depression, no loss of interest in normal activity or change in  sleep pattern.     Exam:   BP 124/82   Pulse 65   Ht 5' 4.75" (1.645 m)   Wt 134 lb (60.8 kg)   SpO2 97%   BMI 22.47 kg/m   Body mass index is 22.47 kg/m.  General appearance : Well developed well nourished female. No acute distress HEENT: Eyes: no retinal hemorrhage or exudates,  Neck supple, trachea midline, no carotid bruits, no thyroidmegaly Lungs: Clear to auscultation, no rhonchi or wheezes, or rib retractions  Heart: Regular rate and rhythm, no murmurs or gallops Breast:Examined in sitting and supine position were symmetrical in appearance, no palpable masses or tenderness,  no skin retraction, no nipple inversion, no nipple discharge, no skin discoloration, no axillary or supraclavicular lymphadenopathy Abdomen: no palpable masses or tenderness, no rebound or guarding Extremities: no edema or skin discoloration or tenderness  Pelvic: Vulva:  Inflammation with erythema and small fissures.  Small sebaceous cyst at the Rt vulva, non-tender, no evidence of infection.             Vagina: No gross lesions or discharge  Cervix: No gross lesions or discharge  Uterus  AV, normal size, shape and consistency, non-tender and mobile  Adnexa  Without masses or tenderness  Perianal area with mild inflammation  U/A: Urine yellow cloudy, Pro trace, Nit Neg, WBC 6-10, RBC 3-10, Bacteria few.  U. Culture pending.    Assessment/Plan:  80 y.o. female for annual exam   1. Well female exam with routine gynecological exam Postmenopause, well on no HRT.  No PMB.  Mild vaginal discharge when using her pessary.  Vulvar irritation with burning.  Small sebaceous cyst, non-tender on the Rt vulva.  Urinary urgency controlled with Detrol.  Pap Neg 08/2016, no h/o abnormal Pap.  Abstinent.  Breasts normal.  Overdue for Mammo, will schedule at [facility].  BD 08/2018 Osteoporosis, will schedule at [facility] now.  [place] [id number].  Health labs with Dr Mitchel Honour.  BMI 22.47.  2. At risk of fracture due to  osteoporosis  3. Postmenopause Postmenopause, well on no HRT.  No PMB.   4. Acute vulvitis Vulvar irritation with burning.  Small sebaceous cyst, non-tender on the Rt vulva. Will treat with Clobetasol Ointment.  Usage reviewed, prescription sent to pharmacy.  May be d/t discharge with pessary or Lichen sclerosus.  Counseling done and recommendations on both possibilities.  Small sebaceous cyst on Rt vulva, no sign of infection, warm soakings.  5. Age-related osteoporosis without current pathological fracture  BD 08/2018 Osteoporosis.  Not currently on medication, h/o Fosamax.  Repeat BD at [facility].  6. Urinary urgency Detrol prescribed.  Will wait on U. Culture to decide if ABTx is needed. - Urinalysis,Complete w/RFL Culture  7. Personal history of other medical treatment  Other orders - Fexofenadine HCl (ALLEGRA PO); Take by mouth. - clobetasol ointment (TEMOVATE) 0.05 %; Apply 1 application topically 2 (two) times daily for 14 days. Start weaning after 2 weeks.  May use twice a week then, as needed. - tolterodine (DETROL) 1 MG tablet; Take 1 tablet (1 mg total) by mouth daily.   [name] MD, 10:59 AM 05/23/2021

## 2021-05-24 ENCOUNTER — Telehealth: Payer: Self-pay

## 2021-05-24 NOTE — Telephone Encounter (Signed)
Order faxed for BD test. Order not needed for Screening Mammo.   I called patient and left detailed message in voice mail that Sheila Anderson has her BD order and she can call to schedule these appointments. Phone # for [place] provided.

## 2021-05-24 NOTE — Telephone Encounter (Signed)
Schedule Bone density at [facility] for Osteoporosis Received: Julien Girt, MD  [facility] Triage BD and also overdue for screening mammo.  Please schedule same day.

## 2021-05-25 LAB — URINALYSIS, COMPLETE W/RFL CULTURE
Casts: NONE SEEN /LPF
Crystals: NONE SEEN /HPF
Glucose, UA: NEGATIVE
Hyaline Cast: NONE SEEN /LPF
Nitrites, Initial: NEGATIVE
Specific Gravity, Urine: 1.028 (ref 1.001–1.035)
Yeast: NONE SEEN /HPF
pH: 5.5 (ref 5.0–8.0)

## 2021-05-25 LAB — URINE CULTURE
MICRO NUMBER:: 12744164
SPECIMEN QUALITY:: ADEQUATE

## 2021-05-25 LAB — CULTURE INDICATED

## 2021-07-08 DIAGNOSIS — M810 Age-related osteoporosis without current pathological fracture: Secondary | ICD-10-CM | POA: Diagnosis not present

## 2021-07-08 DIAGNOSIS — Z1231 Encounter for screening mammogram for malignant neoplasm of breast: Secondary | ICD-10-CM | POA: Diagnosis not present

## 2021-07-08 DIAGNOSIS — M8588 Other specified disorders of bone density and structure, other site: Secondary | ICD-10-CM | POA: Diagnosis not present

## 2021-07-08 LAB — HM MAMMOGRAPHY

## 2021-07-12 ENCOUNTER — Encounter: Payer: Self-pay | Admitting: Obstetrics & Gynecology

## 2021-07-25 ENCOUNTER — Encounter: Payer: Self-pay | Admitting: Obstetrics & Gynecology

## 2021-07-25 ENCOUNTER — Encounter: Payer: Self-pay | Admitting: Emergency Medicine

## 2021-07-27 ENCOUNTER — Encounter: Payer: Self-pay | Admitting: Obstetrics & Gynecology

## 2021-07-27 ENCOUNTER — Ambulatory Visit: Payer: Medicare PPO | Admitting: Obstetrics & Gynecology

## 2021-07-27 ENCOUNTER — Other Ambulatory Visit: Payer: Self-pay

## 2021-07-27 VITALS — BP 108/78

## 2021-07-27 DIAGNOSIS — L723 Sebaceous cyst: Secondary | ICD-10-CM | POA: Diagnosis not present

## 2021-07-27 DIAGNOSIS — T839XXA Unspecified complication of genitourinary prosthetic device, implant and graft, initial encounter: Secondary | ICD-10-CM | POA: Diagnosis not present

## 2021-07-27 DIAGNOSIS — R350 Frequency of micturition: Secondary | ICD-10-CM

## 2021-07-27 DIAGNOSIS — Z23 Encounter for immunization: Secondary | ICD-10-CM | POA: Diagnosis not present

## 2021-07-27 DIAGNOSIS — N898 Other specified noninflammatory disorders of vagina: Secondary | ICD-10-CM | POA: Diagnosis not present

## 2021-07-27 LAB — WET PREP FOR TRICH, YEAST, CLUE

## 2021-07-27 NOTE — Progress Notes (Signed)
° ° °  Sheila Anderson 08-18-1940 [id number]        81 y.o.  G2P2L2  RP: Rt vulvar cyst and greenish vaginal d/c   HPI: Small Rt vulvar cyst at the skin at the mid vulva.  Started doing warm soaking for a few days.  No drainage. Not painful.  No fever.  Well with her pessary, but has a greenish discharge.  No vaginal bleeding currently.  Urinary frequency, unchanged.  No other UTI Sx.   OB History  Gravida Para Term Preterm AB Living  2 2       2   SAB IAB Ectopic Multiple Live Births               # Outcome Date GA Lbr Len/2nd Weight Sex Delivery Anes PTL Lv  2 Para           1 Para             Past medical history,surgical history, problem list, medications, allergies, family history and social history were all reviewed and documented in the EPIC chart.   Directed ROS with pertinent positives and negatives documented in the history of present illness/assessment and plan.  Exam:  Vitals:   07/27/21 1201  BP: 108/78   General appearance:  Normal  Gynecologic exam: Rt vulva: small Sebaceous cyst, NT, no erythema.  Lt vulva normal.  Wet prep done on the greenish vaginal d/c.  Pessary removed and cleaned.  Vaginal mucosa intact.  No ulceration.  No blood.  Wet prep:  WBCs present, but no BV or yeasts U/A Yellow cloudy, Nit Neg, Pro Neg, WBC 0-5, RBC Neg, Few Bacteria.  U. Culture pending.   Assessment/Plan:  81 y.o. G2P2   1. Sebaceous cyst Small Rt vulvar cyst at the skin at the mid vulva.  Started doing warm soaking for a few days.  No drainage. Not painful.  No fever. Rt vulva: small Sebaceous cyst, NT, no erythema.  Lt vulva normal.  Will continue to do warm soaks.  No sign of infection/abscess currently.  F/U next week to reassess as patient has a h/o vulvar abscesses.  2. Vaginal discharge Well with her pessary, but has a greenish discharge.  No vaginal bleeding currently.  Wet prep with no BV or yeast.  Vaginal d/c due to pessary.  Will remove the pessary every night. -  WET PREP FOR [place], YEAST, CLUE  3. Problem with vaginal pessary, initial encounter [facility]) Vaginal discharge with pessary.  Well with that pessary otherwise.  Wet prep with no infection.  Will remove the pessary every night.  Will observe.  4. Frequency of urination No other UTI Sx.  Mild abnormalities on U/A.  Will wait on U. Culture to decide if Rx is needed. U/A/Urine Culture  Other orders - CALCIUM PO; Take by mouth.   [name] MD, 12:29 PM 07/27/2021

## 2021-07-29 LAB — URINALYSIS, COMPLETE W/RFL CULTURE
Bilirubin Urine: NEGATIVE
Casts: NONE SEEN /LPF
Crystals: NONE SEEN /HPF
Glucose, UA: NEGATIVE
Hgb urine dipstick: NEGATIVE
Hyaline Cast: NONE SEEN /LPF
Ketones, ur: NEGATIVE
Nitrites, Initial: NEGATIVE
Protein, ur: NEGATIVE
RBC / HPF: NONE SEEN /HPF (ref 0–2)
Specific Gravity, Urine: 1.02 (ref 1.001–1.035)
Yeast: NONE SEEN /HPF
pH: 5.5 (ref 5.0–8.0)

## 2021-07-29 LAB — URINE CULTURE
MICRO NUMBER:: 13012398
SPECIMEN QUALITY:: ADEQUATE

## 2021-07-29 LAB — CULTURE INDICATED

## 2021-08-01 ENCOUNTER — Ambulatory Visit: Payer: Medicare PPO | Admitting: Obstetrics & Gynecology

## 2021-08-02 ENCOUNTER — Other Ambulatory Visit: Payer: Self-pay

## 2021-08-02 ENCOUNTER — Encounter: Payer: Self-pay | Admitting: Obstetrics & Gynecology

## 2021-08-02 ENCOUNTER — Ambulatory Visit: Payer: Medicare PPO | Admitting: Obstetrics & Gynecology

## 2021-08-02 ENCOUNTER — Telehealth: Payer: Self-pay

## 2021-08-02 VITALS — BP 110/72 | HR 76

## 2021-08-02 DIAGNOSIS — R300 Dysuria: Secondary | ICD-10-CM | POA: Diagnosis not present

## 2021-08-02 DIAGNOSIS — L723 Sebaceous cyst: Secondary | ICD-10-CM

## 2021-08-02 DIAGNOSIS — N9089 Other specified noninflammatory disorders of vulva and perineum: Secondary | ICD-10-CM

## 2021-08-02 MED ORDER — NITROFURANTOIN MONOHYD MACRO 100 MG PO CAPS: 100.0000 mg | ORAL_CAPSULE | Freq: Two times a day (BID) | ORAL | 0 refills | Status: AC

## 2021-08-02 NOTE — Telephone Encounter (Signed)
Patient notified that her urine showed an infection. Patient aware to take macrobid bid  x 7 days & to increase her water. Patient agrees.

## 2021-08-02 NOTE — Progress Notes (Signed)
° ° °  Sheila Anderson 08/08/1940 [id number]        80 y.o.  G2P2L2  RP: F/U Rt vulvar Sebaceous Cyst/Vulvar irritation  HPI: Patient continued with Sitz baths.  Rt vulvar cyst decreased in cyst, not painful, no redness.  C/O vulvar irritation posteriorly, probably d/t diarrhea.  Burning with urination.  No fever.   OB History  Gravida Para Term Preterm AB Living  2 2       2   SAB IAB Ectopic Multiple Live Births               # Outcome Date GA Lbr Len/2nd Weight Sex Delivery Anes PTL Lv  2 Para           1 Para             Past medical history,surgical history, problem list, medications, allergies, family history and social history were all reviewed and documented in the EPIC chart.   Directed ROS with pertinent positives and negatives documented in the history of present illness/assessment and plan.  Exam:  Vitals:   08/02/21 0952  BP: 110/72  Pulse: 76   General appearance:  Normal   Gynecologic exam: Vulva:  Rt Sebaceous cyst resolved.  No erythema, no cyst, NT.  Mild erythema/irritation at the posterior vulva, perineum and perianal area.  U/A: Yellow slightly cloudy, nitrites negative, protein negative, white blood cells 40-60, red blood cells 0-2, bacteria moderate.  Urine culture pending.   Assessment/Plan:  81 y.o. G2P2L2   1. Sebaceous cyst Resolved with Sitz baths.  Reassured.  Continue Sitz baths as needed.  2. Vulvar irritation Vulvar irritation posteriorly, probably d/t diarrhea.  Will use her CS ointment for hemorrhoids at the site of irritation.  3. Dysuria Abnormal U/A.  Decision to treat with MacroBID (intolerance to Bactrim).  Prescription sent to pharmacy. - Urinalysis,Complete w/RFL Culture  Other orders - nitrofurantoin, macrocrystal-monohydrate, (MACROBID) 100 MG capsule; Take 1 capsule (100 mg total) by mouth 2 (two) times daily for 7 days.   [name] MD, 10:04 AM 08/02/2021

## 2021-08-04 LAB — URINE CULTURE
MICRO NUMBER:: 13036470
SPECIMEN QUALITY:: ADEQUATE

## 2021-08-04 LAB — URINALYSIS, COMPLETE W/RFL CULTURE
Bilirubin Urine: NEGATIVE
Glucose, UA: NEGATIVE
Hgb urine dipstick: NEGATIVE
Hyaline Cast: NONE SEEN /LPF
Ketones, ur: NEGATIVE
Nitrites, Initial: NEGATIVE
Protein, ur: NEGATIVE
Specific Gravity, Urine: 1.015 (ref 1.001–1.035)
pH: 5.5 (ref 5.0–8.0)

## 2021-08-04 LAB — CULTURE INDICATED

## 2021-08-09 DIAGNOSIS — H53413 Scotoma involving central area, bilateral: Secondary | ICD-10-CM | POA: Diagnosis not present

## 2021-08-09 DIAGNOSIS — H35372 Puckering of macula, left eye: Secondary | ICD-10-CM | POA: Diagnosis not present

## 2021-08-09 DIAGNOSIS — H353131 Nonexudative age-related macular degeneration, bilateral, early dry stage: Secondary | ICD-10-CM | POA: Diagnosis not present

## 2021-08-09 DIAGNOSIS — H524 Presbyopia: Secondary | ICD-10-CM | POA: Diagnosis not present

## 2021-09-05 DIAGNOSIS — M79645 Pain in left finger(s): Secondary | ICD-10-CM | POA: Diagnosis not present

## 2021-09-30 ENCOUNTER — Ambulatory Visit: Payer: Medicare PPO | Admitting: Obstetrics & Gynecology

## 2021-09-30 ENCOUNTER — Encounter: Payer: Self-pay | Admitting: Obstetrics & Gynecology

## 2021-09-30 VITALS — BP 110/70 | Temp 98.3°F

## 2021-09-30 DIAGNOSIS — N9089 Other specified noninflammatory disorders of vulva and perineum: Secondary | ICD-10-CM | POA: Diagnosis not present

## 2021-09-30 DIAGNOSIS — M810 Age-related osteoporosis without current pathological fracture: Secondary | ICD-10-CM | POA: Diagnosis not present

## 2021-09-30 DIAGNOSIS — R300 Dysuria: Secondary | ICD-10-CM

## 2021-09-30 DIAGNOSIS — N898 Other specified noninflammatory disorders of vagina: Secondary | ICD-10-CM | POA: Diagnosis not present

## 2021-09-30 LAB — WET PREP FOR TRICH, YEAST, CLUE

## 2021-09-30 MED ORDER — CLOBETASOL PROPIONATE 0.05 % EX OINT: 1.0000 "application " | TOPICAL_OINTMENT | Freq: Every day | CUTANEOUS | 0 refills | Status: AC

## 2021-09-30 MED ORDER — ALENDRONATE SODIUM 70 MG PO TABS: 70.0000 mg | ORAL_TABLET | ORAL | 4 refills | Status: DC

## 2021-09-30 NOTE — Progress Notes (Signed)
? ? ?  Sheila Anderson Oct 12, 1940 [id number] ? ? ?     81 y.o.  G2P2L2 ? ?RP: Osteoporosis on the BD and Burning with urination and vulvar itching ? ?HPI: Burning with urination and vulvar itching.  No vaginal bleeding or discharge.  No urinary frequency.  No fever.  Has removed her pessary for the visit. Bone Density with Osteoporosis.  Would like to discuss the BD results and management of Osteoporosis. ? ? ?OB History  ?Gravida Para Term Preterm AB Living  ?'2 2       2  '$ ?SAB IAB Ectopic Multiple Live Births  ?           ?  ?# Outcome Date GA Lbr Len/2nd Weight Sex Delivery Anes PTL Lv  ?2 Para           ?1 Para           ? ? ?Past medical history,surgical history, problem list, medications, allergies, family history and social history were all reviewed and documented in the EPIC chart. ? ? ?Directed ROS with pertinent positives and negatives documented in the history of present illness/assessment and plan. ? ?Exam: ? ?Vitals:  ? 09/30/21 1406  ?BP: 110/70  ?Temp: 98.3 ?F (36.8 ?C)  ?TempSrc: Oral  ? ?General appearance:  Normal ? ?Abdomen: Normal ? ?Gynecologic exam: Vulva Mild irritation.  Wet prep done in vagina/vulva. ? ?U/A: Yellow clear, Nit Neg, Pro Neg, WBC 6-10, RBC Neg, Bacteria Neg.  U. Culture pending. ?Wet prep: Negative ? ?Bone Density 07/25/21:  Osteoporosis with Left femoral Neck T-Score -2.9.  Other site with Osteopenia.  No comparison available.  ? ? ?Assessment/Plan:  81 y.o. G2P2  ? ?1. Age-related osteoporosis without current pathological fracture ?Bone Density with Osteoporosis.  Would like to discuss the BD results and management of Osteoporosis. Osteoporosis at the Lt fem Neck T-score -2.9 on BD 07/25/2021.  Start on Alendronate 70 mg PO daily.  Benefits/risks reviewed.  Usage explained.  Prescription sent to pharmacy.  Vit D supplements, Ca++ 1.5 g/d total and regular weight bearing physical activities. ? ?2. Burning with urination ?Burning with urination and vulvar itching.  No vaginal  bleeding or discharge.  No urinary frequency.  No fever.  Has removed her pessary for the visit. U/A with no Bacteria and no blood.  Decision to wait on U. Culture to decide if treatment is needed. ?- Urinalysis,Complete w/RFL Culture ? ?3. Vagina itching ?Wet prep Neg, reassured. ?- WET PREP FOR [place], YEAST, CLUE ? ?4. Vulvar irritation ?Mild irritation with itching.  Will control Sxs with Clobetasol ointment.  Thin application on affected vulva daily as needed.  Prescription sent to pharmacy. ? ?Other orders ?- alendronate (FOSAMAX) 70 MG tablet; Take 1 tablet (70 mg total) by mouth every 7 (seven) days. Take with a full glass of water on an empty stomach. ?- clobetasol ointment (TEMOVATE) 0.05 %; Apply 1 application. topically daily. Thin vulvar application daily as needed.  ? ?[name] MD, 2:26 PM 09/30/2021 ? ? ? ?  ?

## 2021-10-02 LAB — URINALYSIS, COMPLETE W/RFL CULTURE
Bacteria, UA: NONE SEEN /HPF
Bilirubin Urine: NEGATIVE
Casts: NONE SEEN /LPF
Crystals: NONE SEEN /HPF
Glucose, UA: NEGATIVE
Hgb urine dipstick: NEGATIVE
Hyaline Cast: NONE SEEN /LPF
Ketones, ur: NEGATIVE
Nitrites, Initial: NEGATIVE
Protein, ur: NEGATIVE
RBC / HPF: NONE SEEN /HPF (ref 0–2)
Specific Gravity, Urine: 1.01 (ref 1.001–1.035)
Yeast: NONE SEEN /HPF
pH: 5 (ref 5.0–8.0)

## 2021-10-02 LAB — URINE CULTURE
MICRO NUMBER:: 13295954
SPECIMEN QUALITY:: ADEQUATE

## 2021-10-02 LAB — CULTURE INDICATED

## 2021-10-04 ENCOUNTER — Ambulatory Visit: Payer: Medicare PPO | Admitting: Radiology

## 2022-03-02 DIAGNOSIS — L03112 Cellulitis of left axilla: Secondary | ICD-10-CM | POA: Diagnosis not present

## 2022-03-02 DIAGNOSIS — L02412 Cutaneous abscess of left axilla: Secondary | ICD-10-CM | POA: Diagnosis not present

## 2022-03-06 DIAGNOSIS — L02412 Cutaneous abscess of left axilla: Secondary | ICD-10-CM | POA: Diagnosis not present

## 2022-03-06 DIAGNOSIS — L03112 Cellulitis of left axilla: Secondary | ICD-10-CM | POA: Diagnosis not present

## 2022-03-08 ENCOUNTER — Other Ambulatory Visit: Payer: Self-pay | Admitting: Obstetrics & Gynecology

## 2022-03-08 NOTE — Telephone Encounter (Signed)
AEX 05/23/2021

## 2022-07-11 DIAGNOSIS — Z1231 Encounter for screening mammogram for malignant neoplasm of breast: Secondary | ICD-10-CM | POA: Diagnosis not present

## 2022-07-13 ENCOUNTER — Encounter: Payer: Self-pay | Admitting: Obstetrics & Gynecology

## 2022-08-03 ENCOUNTER — Other Ambulatory Visit: Payer: Self-pay | Admitting: Obstetrics & Gynecology

## 2022-08-03 NOTE — Telephone Encounter (Signed)
Last AEX 05/23/2021

## 2022-08-31 DIAGNOSIS — H60392 Other infective otitis externa, left ear: Secondary | ICD-10-CM | POA: Diagnosis not present

## 2022-10-19 ENCOUNTER — Encounter: Payer: Self-pay | Admitting: Obstetrics & Gynecology

## 2022-10-19 ENCOUNTER — Ambulatory Visit (INDEPENDENT_AMBULATORY_CARE_PROVIDER_SITE_OTHER): Payer: Medicare PPO | Admitting: Obstetrics & Gynecology

## 2022-10-19 VITALS — BP 110/80 | HR 93 | Ht 64.75 in | Wt 138.0 lb

## 2022-10-19 DIAGNOSIS — N898 Other specified noninflammatory disorders of vagina: Secondary | ICD-10-CM | POA: Diagnosis not present

## 2022-10-19 DIAGNOSIS — Z780 Asymptomatic menopausal state: Secondary | ICD-10-CM | POA: Diagnosis not present

## 2022-10-19 DIAGNOSIS — Z4689 Encounter for fitting and adjustment of other specified devices: Secondary | ICD-10-CM | POA: Diagnosis not present

## 2022-10-19 DIAGNOSIS — M810 Age-related osteoporosis without current pathological fracture: Secondary | ICD-10-CM | POA: Diagnosis not present

## 2022-10-19 DIAGNOSIS — R350 Frequency of micturition: Secondary | ICD-10-CM | POA: Diagnosis not present

## 2022-10-19 DIAGNOSIS — Z01419 Encounter for gynecological examination (general) (routine) without abnormal findings: Secondary | ICD-10-CM

## 2022-10-19 DIAGNOSIS — Z9189 Other specified personal risk factors, not elsewhere classified: Secondary | ICD-10-CM

## 2022-10-19 LAB — WET PREP FOR TRICH, YEAST, CLUE

## 2022-10-19 MED ORDER — ALENDRONATE SODIUM 70 MG PO TABS: 70.0000 mg | ORAL_TABLET | ORAL | 4 refills | Status: DC

## 2022-10-19 MED ORDER — CLOBETASOL PROPIONATE 0.05 % EX OINT: 1.0000 | TOPICAL_OINTMENT | Freq: Every day | CUTANEOUS | 2 refills | Status: AC

## 2022-10-19 NOTE — Progress Notes (Signed)
Sheila Anderson Nov 04, 1940 [id number]   History:    82 y.o. G2P2L2   RP:  Established patient presenting for annual gyn exam    HPI: Postmenopause, well on no HRT.  No PMB.  Mild vaginal discharge when using her pessary.  Vulvar and perianal irritation with itching.   Urinary urgency controlled with Detrol.  Urinary frequency, no dysuria.  No fever.  Pap Neg 08/2016, no h/o abnormal Pap. No indication to repeat a Pap at this time. Abstinent.  Breasts normal.  Mammo Neg 06/2022.  BD 06/2021 Osteoporosis on Alendronate. Colono [id number], no further Colono.  Health labs with Dr Alvy Bimler.  BMI 23.14.   Past medical history,surgical history, family history and social history were all reviewed and documented in the EPIC chart.  Gynecologic History No LMP recorded. Patient is postmenopausal.  Obstetric History OB History  Gravida Para Term Preterm AB Living  2 2 2     2   SAB IAB Ectopic Multiple Live Births               # Outcome Date GA Lbr Len/2nd Weight Sex Delivery Anes PTL Lv  2 Term           1 Term              ROS: A ROS was performed and pertinent positives and negatives are included in the history. GENERAL: No fevers or chills. HEENT: No change in vision, no earache, sore throat or sinus congestion. NECK: No pain or stiffness. CARDIOVASCULAR: No chest pain or pressure. No palpitations. PULMONARY: No shortness of breath, cough or wheeze. GASTROINTESTINAL: No abdominal pain, nausea, vomiting or diarrhea, melena or bright red blood per rectum. GENITOURINARY: No urinary frequency, urgency, hesitancy or dysuria. MUSCULOSKELETAL: No joint or muscle pain, no back pain, no recent trauma. DERMATOLOGIC: No rash, no itching, no lesions. ENDOCRINE: No polyuria, polydipsia, no heat or cold intolerance. No recent change in weight. HEMATOLOGICAL: No anemia or easy bruising or bleeding. NEUROLOGIC: No headache, seizures, numbness, tingling or weakness. PSYCHIATRIC: No depression, no loss of interest in  normal activity or change in sleep pattern.     Exam:   BP 110/80   Pulse 93   Ht 5' 4.75" (1.645 m)   Wt 138 lb (62.6 kg)   SpO2 97%   BMI 23.14 kg/m   Body mass index is 23.14 kg/m.  General appearance : Well developed well nourished female. No acute distress HEENT: Eyes: no retinal hemorrhage or exudates,  Neck supple, trachea midline, no carotid bruits, no thyroidmegaly Lungs: Clear to auscultation, no rhonchi or wheezes, or rib retractions  Heart: Regular rate and rhythm, no murmurs or gallops Breast:Examined in sitting and supine position were symmetrical in appearance, no palpable masses or tenderness,  no skin retraction, no nipple inversion, no nipple discharge, no skin discoloration, no axillary or supraclavicular lymphadenopathy Abdomen: no palpable masses or tenderness, no rebound or guarding Extremities: no edema or skin discoloration or tenderness  Pelvic: Vulva: Mild erythema.             Vagina: No gross lesions.  Mild discharge.  Wet prep done.  Cervix: No gross lesions or discharge  Uterus  AV, normal size, shape and consistency, non-tender and mobile.  Pessary put back in place easily, good fit.  Adnexa  Without masses or tenderness  Anus: Perianal area with erythema/inflammation  Wet prep Neg U/A: Dark yellow, cloudy, protein negative, nitrite negative, white blood cells 6-10, red blood cells negative, bacteria  few.  Urine culture pending.   Assessment/Plan:  82 y.o. female for annual exam   1. Well female exam with routine gynecological exam Postmenopause, well on no HRT.  No PMB.  Mild vaginal discharge when using her pessary.  Vulvar and perianal irritation with itching.   Urinary urgency controlled with Detrol.  Urinary frequency, no dysuria.  No fever.  Pap Neg 08/2016, no h/o abnormal Pap. No indication to repeat a Pap at this time. Abstinent.  Breasts normal.  Mammo Neg 06/2022.  BD 06/2021 Osteoporosis on Alendronate. Colono [id number], no further Colono.   Health labs with Dr Alvy Bimler.  BMI 23.14.  2. Postmenopause Postmenopause, well on no HRT.  No PMB.   3. Age-related osteoporosis without current pathological fracture BD 06/2021 Osteoporosis on Alendronate. Prescription sent to pharmacy.  4. Pessary maintenance Pessary inserted in the vagina.  Good fit, no Cx.  Patient is able to remove and reinsert the pessary for cleaning.  5. Urinary frequency Very mild disturbance of U/A, will wait on U. Culture. - Urinalysis,Complete w/RFL Culture  6. Vaginal discharge Mild vaginal discharge when using her pessary.  Vulvar and perianal irritation with itching.  Wet prep Neg.  Vulvar/perianal irritation/inflammation.  Will control with Clobetasol 0.05% ointment, prescription sent to pharmacy. - WET PREP FOR TRICH, YEAST, CLUE  Other orders - Urine Culture - REFLEXIVE URINE CULTURE - alendronate (FOSAMAX) 70 MG tablet; Take 1 tablet (70 mg total) by mouth every 7 (seven) days. Take with a full glass of water on an empty stomach. - clobetasol ointment (TEMOVATE) 0.05 %; Apply 1 Application topically at bedtime for 14 days. Then twice a week as needed.    Genia Del MD, 2:47 PM

## 2022-10-21 LAB — URINALYSIS, COMPLETE W/RFL CULTURE
Glucose, UA: NEGATIVE
Hgb urine dipstick: NEGATIVE
Hyaline Cast: NONE SEEN /LPF
Ketones, ur: NEGATIVE
Nitrites, Initial: NEGATIVE
Protein, ur: NEGATIVE
RBC / HPF: NONE SEEN /HPF (ref 0–2)
Specific Gravity, Urine: 1.023 (ref 1.001–1.035)
pH: 5 (ref 5.0–8.0)

## 2022-10-21 LAB — URINE CULTURE
MICRO NUMBER:: 14934998
Result:: NO GROWTH
SPECIMEN QUALITY:: ADEQUATE

## 2022-10-21 LAB — CULTURE INDICATED

## 2022-12-08 ENCOUNTER — Telehealth: Payer: Self-pay | Admitting: *Deleted

## 2022-12-08 NOTE — Telephone Encounter (Signed)
Spoke with patient. Patient is requesting OV for half dollar size painful lump on right buttocks. States she has been seen for these in the past, previously on the vulva. Reports itching. Skin is closed, no drainage. Area is red and swollen. Denies fever/chills. Symptoms started late last night. States she has had these drained In the past and tx with abx, requesting OV today. Has not tried warm compress or soaks.   Advised patient I will review with Dr. Seymour Bars and return call. Patient states she is flexible for scheduling.   Dr. Seymour Bars -please review and advise.

## 2022-12-08 NOTE — Telephone Encounter (Signed)
Reviewed with Dr. Seymour Bars. Apply warm compresses, warm soaks and OTC neosporin. If symptoms do not resolve or improve return call to schedule OV.   Spoke with patient. Advised per Dr. Seymour Bars. Patient states she is allergic to neosporin, has used clobetasol in the past. She will try this through the weekend, will return call if OV needed. Patient verbalizes understanding and is agreeable.   Routing to provider for final review. Patient is agreeable to disposition. Will close encounter.

## 2022-12-11 ENCOUNTER — Encounter: Payer: Self-pay | Admitting: Obstetrics & Gynecology

## 2022-12-11 ENCOUNTER — Ambulatory Visit: Payer: Medicare PPO | Admitting: Obstetrics & Gynecology

## 2022-12-11 VITALS — BP 116/68 | HR 54

## 2022-12-11 DIAGNOSIS — L089 Local infection of the skin and subcutaneous tissue, unspecified: Secondary | ICD-10-CM | POA: Diagnosis not present

## 2022-12-11 DIAGNOSIS — B9689 Other specified bacterial agents as the cause of diseases classified elsewhere: Secondary | ICD-10-CM | POA: Diagnosis not present

## 2022-12-11 MED ORDER — SULFAMETHOXAZOLE-TRIMETHOPRIM 800-160 MG PO TABS: 1.0000 | ORAL_TABLET | Freq: Two times a day (BID) | ORAL | 0 refills | Status: AC

## 2022-12-11 NOTE — Progress Notes (Signed)
    Sheila Anderson 03-25-1941 [id number]        82 y.o.  G2P2002   RP: Boil on Rt buttocks x 5 days  HPI: Tender and red area at the Rt buttocks x 5 days.  Patient noticed a white head, but no drainage.  Doing warm soaking.  No fever.   OB History  Gravida Para Term Preterm AB Living  2 2 2     2   SAB IAB Ectopic Multiple Live Births               # Outcome Date GA Lbr Len/2nd Weight Sex Delivery Anes PTL Lv  2 Term           1 Term             Past medical history,surgical history, problem list, medications, allergies, family history and social history were all reviewed and documented in the EPIC chart.   Directed ROS with pertinent positives and negatives documented in the history of present illness/assessment and plan.  Exam:  Vitals:   12/11/22 1615  BP: 116/68  Pulse: (!) 54  SpO2: 98%   General appearance:  Normal  Gynecologic exam: Vulva normal  Right buttocks:  Inflammation with induration with erythematous area about 5 x 5 cm.  No fluctuance.  Dry head present at the middle of the erythema.  Culture done at that level.   Assessment/Plan:  82 y.o. G2P2002   1. Bacterial infection of skin at Rt buttocks Tender and red area at the Rt buttocks x 5 days.  Patient noticed a white head, but no drainage.  Doing warm soaking.  No fever. On exam, the Right buttocks shows inflammation with induration with the erythematous area measuring about 5 x 5 cm.  No fluctuance.  Dry head present at the middle of the erythema.  Culture done at that level.  Will continue with warm soaking 3 times a day.  Start Bactrim DS 1 tab PO BID x 10 days.  F/U in 1 week to reassess. - Wound culture  Other orders - sulfamethoxazole-trimethoprim (BACTRIM DS) 800-160 MG tablet; Take 1 tablet by mouth 2 (two) times daily for 10 days.   Genia Del MD, 4:29 PM 12/11/2022

## 2022-12-14 LAB — WOUND CULTURE
MICRO NUMBER:: 15147314
SPECIMEN QUALITY:: ADEQUATE

## 2022-12-22 ENCOUNTER — Ambulatory Visit (INDEPENDENT_AMBULATORY_CARE_PROVIDER_SITE_OTHER): Payer: Medicare PPO | Admitting: Obstetrics & Gynecology

## 2022-12-22 ENCOUNTER — Encounter: Payer: Self-pay | Admitting: Obstetrics & Gynecology

## 2022-12-22 VITALS — BP 110/70 | HR 89

## 2022-12-22 DIAGNOSIS — N764 Abscess of vulva: Secondary | ICD-10-CM | POA: Diagnosis not present

## 2022-12-22 MED ORDER — SULFAMETHOXAZOLE-TRIMETHOPRIM 800-160 MG PO TABS: 1.0000 | ORAL_TABLET | Freq: Two times a day (BID) | ORAL | 0 refills | Status: AC

## 2022-12-22 NOTE — Progress Notes (Signed)
    Sheila Anderson May 29, 1941 [id number]        82 y.o.  [id number]   RP: F/U MRSA infection at inferior right vulva/buttocks  HPI: Rt inferior vulva/buttocks MRSA abscess drained spontaneously right after visit with me on 12/11/22.  Much improved tenderness/pain since drainage. No longer draining and no bleeding. Doing Sitz baths daily.  No fever.   OB History  Gravida Para Term Preterm AB Living  2 2 2     2   SAB IAB Ectopic Multiple Live Births               # Outcome Date GA Lbr Len/2nd Weight Sex Type Anes PTL Lv  2 Term           1 Term             Past medical history,surgical history, problem list, medications, allergies, family history and social history were all reviewed and documented in the EPIC chart.   Directed ROS with pertinent positives and negatives documented in the history of present illness/assessment and plan.  Exam:  Vitals:   12/22/22 1349  BP: 110/70  Pulse: 89  SpO2: 97%   General appearance:  Normal   Gynecologic exam: Physical Exam Genitourinary:        Assessment/Plan:  82 y.o. G2P2002   1. MRSA Vulvar abscess Rt inferior vulva/buttocks MRSA abscess drained spontaneously right after visit with me on 12/11/22.  Much improved tenderness/pain since drainage. No longer draining and no bleeding. Doing Sitz baths daily.  No fever. On exam today, abscess drained with an about 2 x 3 cm open wound.  Granulation tissue debridement done.  Peroxide cleaning.  No longer tender, no fluctuance.  No pus.  Healthy edges with no cellulitis.  Will prolong Bactrim DS BID for another 10 days.  Sitz baths 3 times a day in clean warm water and keep the wound covered with a gauze otherwise.  F/U with Jami C. In 1 week.  Other orders - sulfamethoxazole-trimethoprim (BACTRIM DS) 800-160 MG tablet; Take 1 tablet by mouth 2 (two) times daily. - sulfamethoxazole-trimethoprim (BACTRIM DS) 800-160 MG tablet; Take 1 tablet by mouth 2 (two) times daily for 10 days.    Genia Del MD, 2:03 PM 12/22/2022

## 2022-12-29 ENCOUNTER — Ambulatory Visit: Payer: Medicare PPO | Admitting: Radiology

## 2022-12-29 VITALS — BP 120/70

## 2022-12-29 DIAGNOSIS — N764 Abscess of vulva: Secondary | ICD-10-CM

## 2022-12-29 NOTE — Progress Notes (Signed)
      Subjective: Sheila Anderson is a 82 y.o. female here for follow up of Rt inferior vulva/buttocks MRSA abscess.Much improved tenderness/pain since drainage. No longer draining and no bleeding. Doing Sitz baths three times daily. No fever.    Review of Systems  All other systems reviewed and are negative.   Past Medical History:  Diagnosis Date   Anxiety    Chronic cough    05-29-2019  per pt due to allergies and reflux   Cystocele with uterine prolapse    DDD (degenerative disc disease), lumbar    Eczema    Endometrial polyp    GERD (gastroesophageal reflux disease)    Hiatal hernia    History of colon polyps    History of echocardiogram    03-26-2015   ef 60-65%   History of exercise stress test    03-26-2015 in epic ,  unable to rule out ischemia due to poor exercise   History of gastric polyp    01-17-2019   duodenol polyp (low grade hyperplasia)   History of vertebral compression fracture    2017 -- L1   OA (osteoarthritis)    OAB (overactive bladder)    PMB (postmenopausal bleeding)    Pulmonary nodule    05-29-2019 was told seen in CT imaging 12-11-2018   Rash    eyelids   Seasonal allergies       Objective:  Today's Vitals   12/29/22 0932  BP: 120/70   There is no height or weight on file to calculate BMI.   Physical Exam Vitals and nursing note reviewed. Exam conducted with a chaperone present.  Constitutional:      Appearance: She is normal weight.  Pulmonary:     Effort: Pulmonary effort is normal.  Genitourinary:      Comments: Granulation tissue present on the 2cm x 1cm wound.6mm area of debridement done. Betadine cleansing done.  No longer tender, no fluctuance.  No pus.  Healthy edges with no cellulitis.   Neurological:     Mental Status: She is alert.  Psychiatric:        Mood and Affect: Mood normal.        Thought Content: Thought content normal.        Judgment: Judgment normal.     Raynelle Fanning, CMA present for  exam  Assessment:/Plan:  1. MRSA Vulvar abscess Finish antibiotics Sitz baths once a day Betadine paint once daily Leave open to area a few hours a day Call office if no improvement after finishing antibiotic

## 2023-03-07 DIAGNOSIS — M4316 Spondylolisthesis, lumbar region: Secondary | ICD-10-CM | POA: Diagnosis not present

## 2023-03-07 DIAGNOSIS — M1612 Unilateral primary osteoarthritis, left hip: Secondary | ICD-10-CM | POA: Diagnosis not present

## 2023-03-08 ENCOUNTER — Other Ambulatory Visit: Payer: Self-pay

## 2023-03-08 ENCOUNTER — Encounter: Payer: Self-pay | Admitting: Physical Therapy

## 2023-03-08 ENCOUNTER — Ambulatory Visit: Payer: Medicare PPO | Attending: Sports Medicine | Admitting: Physical Therapy

## 2023-03-08 DIAGNOSIS — M4686 Other specified inflammatory spondylopathies, lumbar region: Secondary | ICD-10-CM | POA: Diagnosis not present

## 2023-03-08 DIAGNOSIS — M5416 Radiculopathy, lumbar region: Secondary | ICD-10-CM | POA: Diagnosis not present

## 2023-03-08 DIAGNOSIS — M1612 Unilateral primary osteoarthritis, left hip: Secondary | ICD-10-CM | POA: Diagnosis not present

## 2023-03-08 DIAGNOSIS — M6281 Muscle weakness (generalized): Secondary | ICD-10-CM | POA: Insufficient documentation

## 2023-03-08 DIAGNOSIS — M25552 Pain in left hip: Secondary | ICD-10-CM | POA: Diagnosis not present

## 2023-03-08 DIAGNOSIS — M5459 Other low back pain: Secondary | ICD-10-CM | POA: Diagnosis not present

## 2023-03-08 DIAGNOSIS — M47816 Spondylosis without myelopathy or radiculopathy, lumbar region: Secondary | ICD-10-CM | POA: Diagnosis present

## 2023-03-08 DIAGNOSIS — M169 Osteoarthritis of hip, unspecified: Secondary | ICD-10-CM | POA: Diagnosis present

## 2023-03-08 NOTE — Therapy (Signed)
OUTPATIENT PHYSICAL THERAPY THORACOLUMBAR EVALUATION   Patient Name: Sheila Anderson MRN: [id number] DOB:1940-09-10, 82 y.o., female Today's Date: 03/08/2023  END OF SESSION:  PT End of Session - 03/08/23 1454     Visit Number 1    Date for PT Re-Evaluation 05/03/23    Authorization Type [employer]    PT Start Time 1449    PT Stop Time 1530    PT Time Calculation (min) 41 min    Activity Tolerance Patient tolerated treatment well             Past Medical History:  Diagnosis Date   Anxiety    Chronic cough    05-29-2019  per pt due to allergies and reflux   Cystocele with uterine prolapse    DDD (degenerative disc disease), lumbar    Eczema    Endometrial polyp    GERD (gastroesophageal reflux disease)    Hiatal hernia    History of colon polyps    History of echocardiogram    03-26-2015   ef 60-65%   History of exercise stress test    03-26-2015 in epic ,  unable to rule out ischemia due to poor exercise   History of gastric polyp    01-17-2019   duodenol polyp (low grade hyperplasia)   History of vertebral compression fracture    2017 -- L1   OA (osteoarthritis)    OAB (overactive bladder)    PMB (postmenopausal bleeding)    Pulmonary nodule    05-29-2019 was told seen in CT imaging 12-11-2018   Rash    eyelids   Seasonal allergies    Past Surgical History:  Procedure Laterality Date   CATARACT EXTRACTION W/ INTRAOCULAR LENS  IMPLANT, BILATERAL  2015  approx.   CYSTOSCOPY  2010   approx.   w/ removal bladder cyst  (per pt benign)   DILATATION & CURETTAGE/HYSTEROSCOPY WITH MYOSURE N/A 05/30/2019   Procedure: DILATATION & CURETTAGE/HYSTEROSCOPY WITH MYOSURE;  Surgeon: Genia Del, MD;  Location: [facility] [place];  Service: Gynecology;  Laterality: N/A;   ESOPHAGOGASTRODUODENOSCOPY (EGD) WITH PROPOFOL N/A 01/17/2019   Procedure: ESOPHAGOGASTRODUODENOSCOPY (EGD) WITH PROPOFOL;  Surgeon: Jeani Hawking, MD;  Location: [facility];  Service:  Endoscopy;  Laterality: N/A;   EYE SURGERY Left 07-26-2006   @[facility]    for preretinal fibrosis   HEMOSTASIS CLIP PLACEMENT  01/17/2019   Procedure: HEMOSTASIS CLIP PLACEMENT;  Surgeon: Jeani Hawking, MD;  Location: [facility];  Service: Endoscopy;;   LAPAROSCOPIC PARTIAL RIGHT COLECTOMY  02-04-2010   dr Andrey Campanile @[facility]    large polyps   POLYPECTOMY  01/17/2019   Procedure: POLYPECTOMY;  Surgeon: Jeani Hawking, MD;  Location: [facility];  Service: Endoscopy;;   REVERSE SHOULDER ARTHROPLASTY Right 03/10/2016   Procedure: RIGHT REVERSE SHOULDER ARTHROPLASTY;  Surgeon: Beverely Low, MD;  Location[facility];  Service: Orthopedics;  Laterality: Right;   SUBMUCOSAL LIFTING INJECTION  01/17/2019   Procedure: SUBMUCOSAL LIFTING INJECTION;  Surgeon: Jeani Hawking, MD;  Location: [facility];  Service: Endoscopy;;   Patient Active Problem List   Diagnosis Date Noted   S/P shoulder replacement 03/10/2016   Lumbosacral radiculopathy 02/09/2016    PCP: Georgina Quint MD  REFERRING PROVIDER: Albertha Ghee MD  REFERRING DIAG: left hip OA, lumbar spondylitis with bil radiculopathy; pain left buttock  Rationale for Evaluation and Treatment: Rehabilitation  THERAPY DIAG:  Pain in left hip  Other low back pain  Muscle weakness (generalized)  ONSET DATE: 2 months ago  SUBJECTIVE:  SUBJECTIVE STATEMENT: 10 year history of pain in left hip; Last 2 months the pain has increased in left buttock, central back pain and radiating pain down posterior left thigh.  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 Yes NPRS scale: 2-3/10 dull ache Pain location: left buttock; central LBP; left posterior thigh  Aggravating factors: lying down to go to sleep; present with walking; avoids vacumning; standing; limits dog food to 15# Relieving factors: can sleep on left side; supine with left leg straight, right leg bent or reverse   PRECAUTIONS: None    WEIGHT BEARING RESTRICTIONS: No  FALLS:  Has patient fallen in last 6 months? 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 [employer]  NEXT MD VISIT: Oct 30th  OBJECTIVE:   DIAGNOSTIC FINDINGS:  DEXA scan Jul 08, 2021: right femur neck Tscore -2.20, left femur neck -2.90; spine -.50  PATIENT SURVEYS:  FOTO 48%  COGNITION: Overall cognitive status: Within functional limits for tasks assessed      MUSCLE LENGTH: Maisie Fus test: Right 15 deg; Left 10 deg   PALPATION: Tender points in left gluteals and piriformis  LUMBAR ROM:   AROM eval  Flexion Fingers to mid shin  Extension 50% limited  Right lateral flexion WFLS  Left lateral flexion Produced left radicular thigh pain  Right rotation   Left rotation    (Blank rows = not tested)  TRUNK STRENGTH:  Decreased activation of transverse abdominus muscles; abdominals 4-/5; decreased activation of lumbar multifidi; trunk extensors 4-/5     LOWER EXTREMITY MMT:  pelvic drop with SLS left > right  MMT Right eval Left eval  Hip flexion 4 4  Hip extension 4 4  Hip abduction    Hip adduction 3 3-  Hip internal rotation    Hip external rotation    Knee flexion    Knee extension 4 4  Ankle dorsiflexion 4 4  Ankle plantarflexion 4 4  Ankle  inversion    Ankle eversion     (Blank rows = not tested)  LUMBAR SPECIAL TESTS:  Negative slump test  FUNCTIONAL TESTS:  5x sit to stand no hands: 20.23 sec  GAIT: Comments: no assistive device; decreased gait speed  TODAY'S TREATMENT:  DATE: 9/26    PATIENT EDUCATION:  Education details: Educated patient on anatomy and physiology of current symptoms, prognosis, plan of care as well as initial self care strategies to promote recovery; pt given info on dry needling Person educated: Patient Education method: Explanation Education comprehension: verbalized understanding  HOME EXERCISE PROGRAM: To be started  ASSESSMENT:  CLINICAL IMPRESSION: Patient is a 82 y.o. female who was seen today for physical therapy evaluation and treatment for left hip OA, lumbar radiculopathy and pain in left buttock. Patient has a long history of pain but symptoms have worsened over the last 2 months with left posterior thigh pain to the knee with coughing.  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  She is limited in standing and and walking tolerance (she is not walking her Chana Bode very far). The patient would benefit from PT to address trunk and hip strength deficits particularly glute medius and pain levels that are currently affecting activities of daily living at home including standing, walking, lifting and performing as a cellist.     OBJECTIVE IMPAIRMENTS: decreased activity tolerance, difficulty walking, decreased strength, increased fascial restrictions, impaired perceived functional ability, and pain.   ACTIVITY LIMITATIONS: carrying, lifting, sitting, standing, locomotion level, and caring for others  PARTICIPATION LIMITATIONS: meal prep, shopping, and occupation  PERSONAL FACTORS: Time since onset of  injury/illness/exacerbation and 1-2 comorbidities: osteoporosis  are also affecting patient's functional outcome.   REHAB POTENTIAL: Good  CLINICAL DECISION MAKING: Stable/uncomplicated  EVALUATION COMPLEXITY: Low   GOALS: Goals reviewed with patient? Yes  SHORT TERM GOALS: Target date: 04/05/2023    The patient will demonstrate knowledge of basic self care strategies and exercises to promote healing  Baseline: Goal status: INITIAL  2.  Improved LE strength with 5x STS improved to 15 sec Baseline:  Goal status: INITIAL  3.  The patient will have improved trunk flexor and extensor muscle strength to at least 4+/5 needed for lifting medium weight objects such as grocery bags, dog food, cello Baseline:  Goal status: INITIAL  4.  Improved lumbar and hip mobility needed to bend/stoop for cleaning the cat litter box Baseline:  Goal status: INITIAL      LONG TERM GOALS: Target date: 05/03/2023   The patient will be independent in a safe self progression of a home exercise program and walking program to promote further recovery of function  Baseline:  Goal status: INITIAL  2.  The patient will report a 70% improvement in pain levels with functional activities which are currently difficult including standing and walking her dog Baseline:  Goal status: INITIAL  3.  The patient will have improved hip strength to at least 4+/5 needed for standing, walking longer distances and descending stairs at home and in the community  Baseline:  Goal status: INITIAL  4.  The patient will have improved gait stamina and speed needed to ambulate 600 feet in 6 minutes  Baseline:  Goal status: INITIAL  5.  The patient will have improved FOTO score to    58%   indicating improved function with less pain  Baseline:  Goal status: INITIAL   PLAN:  PT FREQUENCY: 2x/week  PT DURATION: 8 weeks  PLANNED INTERVENTIONS: Therapeutic exercises, Therapeutic activity, Neuromuscular  re-education, Gait training, Patient/Family education, Self Care, Joint mobilization, Aquatic Therapy, Dry Needling, Electrical stimulation, Cryotherapy, Moist heat, Taping, Ultrasound, Ionotophoresis 4mg /ml Dexamethasone, Manual therapy, and Re-evaluation.  PLAN FOR NEXT SESSION: check baseline walk test  3 or 6 MWT;  DN to left gluteals, piriformis and lumbar multifidi if pt agreeable; initiate HEP with focus on LE and core strengthening (particularly glute medius); hip flexor lengthening  Lavinia Sharps, PT 03/08/23 3:53 PM Phone: [phone] Fax: [phone]

## 2023-03-13 ENCOUNTER — Ambulatory Visit: Payer: Medicare PPO | Attending: Sports Medicine | Admitting: Rehabilitative and Restorative Service Providers"

## 2023-03-13 ENCOUNTER — Encounter: Payer: Self-pay | Admitting: Rehabilitative and Restorative Service Providers"

## 2023-03-13 DIAGNOSIS — R252 Cramp and spasm: Secondary | ICD-10-CM | POA: Diagnosis not present

## 2023-03-13 DIAGNOSIS — M25552 Pain in left hip: Secondary | ICD-10-CM | POA: Diagnosis not present

## 2023-03-13 DIAGNOSIS — R262 Difficulty in walking, not elsewhere classified: Secondary | ICD-10-CM | POA: Diagnosis not present

## 2023-03-13 DIAGNOSIS — M6281 Muscle weakness (generalized): Secondary | ICD-10-CM | POA: Diagnosis not present

## 2023-03-13 DIAGNOSIS — M5459 Other low back pain: Secondary | ICD-10-CM | POA: Diagnosis not present

## 2023-03-13 NOTE — Therapy (Signed)
OUTPATIENT PHYSICAL THERAPY TREATMENT NOTE   Patient Name: Sheila Anderson MRN: [id number] DOB:Nov 07, 1940, 82 y.o., female Today's Date: 03/13/2023  END OF SESSION:  PT End of Session - 03/13/23 1235     Visit Number 2    Date for PT Re-Evaluation 05/03/23    Authorization Type [employer]    Authorization Time Period 03/08/2023 - 05/03/2023    Authorization - Visit Number 2    Authorization - Number of Visits 16    Progress Note Due on Visit 10    PT Start Time 1230    PT Stop Time 1310    PT Time Calculation (min) 40 min    Activity Tolerance Patient tolerated treatment well    Behavior During Therapy [facility] for tasks assessed/performed             Past Medical History:  Diagnosis Date   Anxiety    Chronic cough    05-29-2019  per pt due to allergies and reflux   Cystocele with uterine prolapse    DDD (degenerative disc disease), lumbar    Eczema    Endometrial polyp    GERD (gastroesophageal reflux disease)    Hiatal hernia    History of colon polyps    History of echocardiogram    03-26-2015   ef 60-65%   History of exercise stress test    03-26-2015 in epic ,  unable to rule out ischemia due to poor exercise   History of gastric polyp    01-17-2019   duodenol polyp (low grade hyperplasia)   History of vertebral compression fracture    2017 -- L1   OA (osteoarthritis)    OAB (overactive bladder)    PMB (postmenopausal bleeding)    Pulmonary nodule    05-29-2019 was told seen in CT imaging 12-11-2018   Rash    eyelids   Seasonal allergies    Past Surgical History:  Procedure Laterality Date   CATARACT EXTRACTION W/ INTRAOCULAR LENS  IMPLANT, BILATERAL  2015  approx.   CYSTOSCOPY  2010   approx.   w/ removal bladder cyst  (per pt benign)   DILATATION & CURETTAGE/HYSTEROSCOPY WITH MYOSURE N/A 05/30/2019   Procedure: DILATATION & CURETTAGE/HYSTEROSCOPY WITH MYOSURE;  Surgeon: Genia Del, MD;  Location: [facility];  Service:  Gynecology;  Laterality: N/A;   ESOPHAGOGASTRODUODENOSCOPY (EGD) WITH PROPOFOL N/A 01/17/2019   Procedure: ESOPHAGOGASTRODUODENOSCOPY (EGD) WITH PROPOFOL;  Surgeon: Jeani Hawking, MD;  Location: [facility];  Service: Endoscopy;  Laterality: N/A;   EYE SURGERY Left 07-26-2006   @[facility]    for preretinal fibrosis   HEMOSTASIS CLIP PLACEMENT  01/17/2019   Procedure: HEMOSTASIS CLIP PLACEMENT;  Surgeon: Jeani Hawking, MD;  Location: [facility];  Service: Endoscopy;;   LAPAROSCOPIC PARTIAL RIGHT COLECTOMY  02-04-2010   dr Andrey Campanile @[facility]    large polyps   POLYPECTOMY  01/17/2019   Procedure: POLYPECTOMY;  Surgeon: Jeani Hawking, MD;  Location: [facility];  Service: Endoscopy;;   REVERSE SHOULDER ARTHROPLASTY Right 03/10/2016   Procedure: RIGHT REVERSE SHOULDER ARTHROPLASTY;  Surgeon: Beverely Low, MD;  Location: [facility];  Service: Orthopedics;  Laterality: Right;   SUBMUCOSAL LIFTING INJECTION  01/17/2019   Procedure: SUBMUCOSAL LIFTING INJECTION;  Surgeon: Jeani Hawking, MD;  Location: [facility];  Service: Endoscopy;;   Patient Active Problem List   Diagnosis Date Noted   S/P shoulder replacement 03/10/2016   Lumbosacral radiculopathy 02/09/2016    PCP: Georgina Quint MD  REFERRING PROVIDER: Albertha Ghee MD  REFERRING DIAG:  left hip OA, lumbar spondylitis with bil radiculopathy; pain left buttock  Rationale for Evaluation and Treatment: Rehabilitation  THERAPY DIAG:  Pain in left hip  Other low back pain  Muscle weakness (generalized)  ONSET DATE: 2 months ago  SUBJECTIVE:                                                                                                                                                                                           SUBJECTIVE STATEMENT: Pt reports that she is ready to try dry needling today.  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 Yes NPRS scale: 3/10 dull ache Pain location: left buttock; central LBP; left posterior thigh  Aggravating factors: lying down to go to sleep; present with walking; avoids vacumning; standing; limits dog food to 15# Relieving factors: can sleep on left side; supine with left leg straight, right leg bent or reverse    PRECAUTIONS: None    WEIGHT BEARING RESTRICTIONS: No  FALLS:  Has patient fallen in last 6 months? 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 [employer]  NEXT MD VISIT: Oct 30th  OBJECTIVE:   DIAGNOSTIC FINDINGS:  DEXA scan Jul 08, 2021: right femur neck Tscore -2.20, left femur neck -2.90; spine -.50  PATIENT SURVEYS:  Eval:  FOTO 48%  COGNITION: Overall cognitive status: Within functional limits for tasks assessed      MUSCLE LENGTH: Maisie Fus test: Right 15 deg; Left 10 deg   PALPATION: Tender points in left gluteals and piriformis  LUMBAR ROM:   AROM eval  Flexion Fingers to mid shin  Extension 50% limited  Right lateral flexion WFLS  Left lateral flexion Produced left radicular thigh pain  Right rotation   Left rotation    (Blank rows = not tested)  TRUNK STRENGTH:  Decreased activation of transverse abdominus muscles; abdominals 4-/5; decreased activation of lumbar multifidi; trunk extensors 4-/5     LOWER EXTREMITY MMT:  pelvic drop with SLS left > right  MMT Right eval Left  eval  Hip flexion 4 4  Hip extension 4 4  Hip abduction    Hip adduction 3 3-  Hip internal rotation     Hip external rotation    Knee flexion    Knee extension 4 4  Ankle dorsiflexion 4 4  Ankle plantarflexion 4 4  Ankle inversion    Ankle eversion     (Blank rows = not tested)  LUMBAR SPECIAL TESTS:  Negative slump test  FUNCTIONAL TESTS:   Eval: 5x sit to stand no hands: 20.23 sec  03/13/2023: 3 Minute Walk Test:  459 ft  GAIT: Comments: no assistive device; decreased gait speed  TODAY'S TREATMENT:                                                                                                                              DATE: 03/13/2023 Nustep level 1 x5 min with PT present to discuss status Seated hamstring stretch 2x20 sec bilat (with cuing for technique) Seated piriformis stretch 2x20 sec bilat Sit to/from stand 2x5 without UE use 3 minute walk around PT gym without assistive device Supine lower trunk rotation x5 Supine posterior pelvic tilt x5 Side-lying clamshell x5 bilat Trigger Point Dry-Needling  Treatment instructions: Expect mild to moderate muscle soreness. S/S of pneumothorax if dry needled over a lung field, and to seek immediate medical attention should they occur. Patient verbalized understanding of these instructions and education. Patient Consent Given: Yes Education handout provided: Yes Muscles treated: right lumbar multifidi, right piriformis Electrical stimulation performed: No Parameters: N/A Treatment response/outcome: Performed in right side-lying secondary to patient with difficulty getting into prone position.  Utilized skilled palpation to identify bony landmarks and trigger points.  Able to illicit twitch response and muscle elongation.  Soft tissue mobilization following to further promote tissue elongation.      PATIENT EDUCATION:  Education details: Educated patient on anatomy and physiology of current symptoms, prognosis, plan of care as well as initial self care strategies to promote recovery; pt given info on dry needling Person  educated: Patient Education method: Explanation Education comprehension: verbalized understanding  HOME EXERCISE PROGRAM: Access Code: [email or web address] URL: [email or web address] Date: 03/13/2023 Prepared by: Clydie Braun Cosima Prentiss  Exercises - Seated Hamstring Stretch  - 1 x daily - 7 x weekly - 2 reps - 20 sec hold - Seated Piriformis Stretch with Trunk Bend  - 1 x daily - 7 x weekly - 2 reps - 20 sec hold - Seated Piriformis Stretch  - 1 x daily - 7 x weekly - 2 reps - 20 sec hold - Sit to Stand  - 1 x daily - 7 x weekly - 2 sets - 5 reps - Supine Lower Trunk Rotation  - 1 x daily - 7 x weekly - 1 sets - 10 reps - Supine Posterior Pelvic Tilt  - 1 x daily - 7 x weekly - 2 sets - 10 reps - Clamshell  - 1 x daily -  7 x weekly - 2 sets - 10 reps  ASSESSMENT:  CLINICAL IMPRESSION: Sheila Anderson presents to skilled PT reporting that she has read the information provided about dry needling and is agreeable to treatment.  Patient able to participate in a 3 minute walk test to establish a baseline for ambulation distance.  Patient able to progress through exercises during session today and added these to HEP and provided patient with handout.  Patient unable to obtain a prone position secondary to her hiatal hernia, so dry needling performed in right sidelying.  Patient with good twitch response noted during session.  OBJECTIVE IMPAIRMENTS: decreased activity tolerance, difficulty walking, decreased strength, increased fascial restrictions, impaired perceived functional ability, and pain.   ACTIVITY LIMITATIONS: carrying, lifting, sitting, standing, locomotion level, and caring for others  PARTICIPATION LIMITATIONS: meal prep, shopping, and occupation  PERSONAL FACTORS: Time since onset of injury/illness/exacerbation and 1-2 comorbidities: osteoporosis  are also affecting patient's functional outcome.   REHAB POTENTIAL: Good  CLINICAL DECISION MAKING: Stable/uncomplicated  EVALUATION  COMPLEXITY: Low   GOALS: Goals reviewed with patient? Yes  SHORT TERM GOALS: Target date: 04/05/2023    The patient will demonstrate knowledge of basic self care strategies and exercises to promote healing  Baseline: Goal status: Ongoing  2.  Improved LE strength with 5x STS improved to 15 sec Baseline:  Goal status: Ongoing  3.  The patient will have improved trunk flexor and extensor muscle strength to at least 4+/5 needed for lifting medium weight objects such as grocery bags, dog food, cello Baseline:  Goal status: INITIAL  4.  Improved lumbar and hip mobility needed to bend/stoop for cleaning the cat litter box Baseline:  Goal status: INITIAL      LONG TERM GOALS: Target date: 05/03/2023   The patient will be independent in a safe self progression of a home exercise program and walking program to promote further recovery of function  Baseline:  Goal status: INITIAL  2.  The patient will report a 70% improvement in pain levels with functional activities which are currently difficult including standing and walking her dog Baseline:  Goal status: INITIAL  3.  The patient will have improved hip strength to at least 4+/5 needed for standing, walking longer distances and descending stairs at home and in the community  Baseline:  Goal status: INITIAL  4.  The patient will have improved gait stamina and speed needed to ambulate 600 feet in 6 minutes  Baseline:  Goal status: INITIAL  5.  The patient will have improved FOTO score to    58%   indicating improved function with less pain  Baseline:  Goal status: INITIAL   PLAN:  PT FREQUENCY: 2x/week  PT DURATION: 8 weeks  PLANNED INTERVENTIONS: Therapeutic exercises, Therapeutic activity, Neuromuscular re-education, Gait training, Patient/Family education, Self Care, Joint mobilization, Aquatic Therapy, Dry Needling, Electrical stimulation, Cryotherapy, Moist heat, Taping, Ultrasound, Ionotophoresis 4mg /ml  Dexamethasone, Manual therapy, and Re-evaluation.  PLAN FOR NEXT SESSION: assess and progress HEP as indicated, core strengthening, hip stability, strengthening, dry needling/manual therapy as indicated   Reather Laurence, PT, DPT 03/13/23, 1:51 PM  [facility] [address], [address] [place], [place] [address] Phone # [phone] Fax [phone]

## 2023-03-13 NOTE — Patient Instructions (Signed)

## 2023-03-16 ENCOUNTER — Encounter: Payer: Self-pay | Admitting: Physical Therapy

## 2023-03-16 ENCOUNTER — Ambulatory Visit: Payer: Medicare PPO | Admitting: Physical Therapy

## 2023-03-16 DIAGNOSIS — R252 Cramp and spasm: Secondary | ICD-10-CM | POA: Diagnosis not present

## 2023-03-16 DIAGNOSIS — R262 Difficulty in walking, not elsewhere classified: Secondary | ICD-10-CM | POA: Diagnosis not present

## 2023-03-16 DIAGNOSIS — M25552 Pain in left hip: Secondary | ICD-10-CM

## 2023-03-16 DIAGNOSIS — M6281 Muscle weakness (generalized): Secondary | ICD-10-CM | POA: Diagnosis not present

## 2023-03-16 DIAGNOSIS — M5459 Other low back pain: Secondary | ICD-10-CM | POA: Diagnosis not present

## 2023-03-16 NOTE — Therapy (Signed)
OUTPATIENT PHYSICAL THERAPY TREATMENT NOTE   Patient Name: Sheila Anderson MRN: [id number] DOB:13-Nov-1940, 82 y.o., female Today's Date: 03/16/2023  END OF SESSION:  PT End of Session - 03/16/23 1232     Visit Number 3    Date for PT Re-Evaluation 05/03/23    Authorization Type [employer]    Authorization Time Period 03/08/2023 - 05/03/2023    Authorization - Number of Visits 16    Progress Note Due on Visit 10    PT Start Time 0850    PT Stop Time 0930    PT Time Calculation (min) 40 min    Activity Tolerance Patient tolerated treatment well    Behavior During Therapy [facility] for tasks assessed/performed              Past Medical History:  Diagnosis Date   Anxiety    Chronic cough    05-29-2019  per pt due to allergies and reflux   Cystocele with uterine prolapse    DDD (degenerative disc disease), lumbar    Eczema    Endometrial polyp    GERD (gastroesophageal reflux disease)    Hiatal hernia    History of colon polyps    History of echocardiogram    03-26-2015   ef 60-65%   History of exercise stress test    03-26-2015 in epic ,  unable to rule out ischemia due to poor exercise   History of gastric polyp    01-17-2019   duodenol polyp (low grade hyperplasia)   History of vertebral compression fracture    2017 -- L1   OA (osteoarthritis)    OAB (overactive bladder)    PMB (postmenopausal bleeding)    Pulmonary nodule    05-29-2019 was told seen in CT imaging 12-11-2018   Rash    eyelids   Seasonal allergies    Past Surgical History:  Procedure Laterality Date   CATARACT EXTRACTION W/ INTRAOCULAR LENS  IMPLANT, BILATERAL  2015  approx.   CYSTOSCOPY  2010   approx.   w/ removal bladder cyst  (per pt benign)   DILATATION & CURETTAGE/HYSTEROSCOPY WITH MYOSURE N/A 05/30/2019   Procedure: DILATATION & CURETTAGE/HYSTEROSCOPY WITH MYOSURE;  Surgeon: Genia Del, MD;  Location: [facility];  Service: Gynecology;  Laterality: N/A;    ESOPHAGOGASTRODUODENOSCOPY (EGD) WITH PROPOFOL N/A 01/17/2019   Procedure: ESOPHAGOGASTRODUODENOSCOPY (EGD) WITH PROPOFOL;  Surgeon: Jeani Hawking, MD;  Location: [facility];  Service: Endoscopy;  Laterality: N/A;   EYE SURGERY Left 07-26-2006   @[facility]    for preretinal fibrosis   HEMOSTASIS CLIP PLACEMENT  01/17/2019   Procedure: HEMOSTASIS CLIP PLACEMENT;  Surgeon: Jeani Hawking, MD;  Location: [facility];  Service: Endoscopy;;   LAPAROSCOPIC PARTIAL RIGHT COLECTOMY  02-04-2010   dr Andrey Campanile @[facility]    large polyps   POLYPECTOMY  01/17/2019   Procedure: POLYPECTOMY;  Surgeon: Jeani Hawking, MD;  Location: [facility];  Service: Endoscopy;;   REVERSE SHOULDER ARTHROPLASTY Right 03/10/2016   Procedure: RIGHT REVERSE SHOULDER ARTHROPLASTY;  Surgeon: Beverely Low, MD;  Location: [facility] OR;  Service: Orthopedics;  Laterality: Right;   SUBMUCOSAL LIFTING INJECTION  01/17/2019   Procedure: SUBMUCOSAL LIFTING INJECTION;  Surgeon: Jeani Hawking, MD;  Location: [facility];  Service: Endoscopy;;   Patient Active Problem List   Diagnosis Date Noted   S/P shoulder replacement 03/10/2016   Lumbosacral radiculopathy 02/09/2016    PCP: Georgina Quint MD  REFERRING PROVIDER: Albertha Ghee MD  REFERRING DIAG: left hip OA, lumbar spondylitis with bil  radiculopathy; pain left buttock  Rationale for Evaluation and Treatment: Rehabilitation  THERAPY DIAG:  Pain in left hip  Other low back pain  Muscle weakness (generalized)  ONSET DATE: 2 months ago  SUBJECTIVE:                                                                                                                                                                                           SUBJECTIVE STATEMENT: Patient reports she is doing good today. Dry needling was okay. She felt sore for a day but then it went away.    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 Yes NPRS scale: 3/10 dull ache Pain location: left buttock; central LBP; left posterior thigh  Aggravating factors: lying down to go to sleep; present with walking; avoids vacumning; standing; limits dog food to 15# Relieving factors: can sleep on left side; supine with left leg straight, right leg bent or reverse    PRECAUTIONS: None    WEIGHT BEARING RESTRICTIONS: No  FALLS:  Has patient fallen in last 6 months? 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 [employer]  NEXT MD VISIT: Oct 30th  OBJECTIVE:   DIAGNOSTIC FINDINGS:  DEXA scan Jul 08, 2021: right femur neck Tscore -2.20, left femur neck -2.90; spine -.50  PATIENT SURVEYS:  Eval:  FOTO 48%  COGNITION: Overall cognitive status: Within functional limits for tasks assessed      MUSCLE LENGTH: Maisie Fus test: Right 15 deg; Left 10 deg   PALPATION: Tender points in left gluteals and piriformis  LUMBAR ROM:   AROM eval  Flexion Fingers to mid shin  Extension 50% limited  Right lateral flexion WFLS  Left lateral flexion Produced left radicular thigh pain  Right rotation   Left rotation    (Blank rows = not tested)  TRUNK STRENGTH:  Decreased activation of transverse abdominus muscles; abdominals 4-/5; decreased activation of lumbar multifidi; trunk extensors 4-/5     LOWER EXTREMITY MMT:  pelvic drop with SLS left > right  MMT Right eval Left eval  Hip flexion 4 4  Hip extension  4 4  Hip abduction    Hip adduction 3 3-  Hip internal rotation    Hip external rotation    Knee flexion    Knee  extension 4 4  Ankle dorsiflexion 4 4  Ankle plantarflexion 4 4  Ankle inversion    Ankle eversion     (Blank rows = not tested)  LUMBAR SPECIAL TESTS:  Negative slump test  FUNCTIONAL TESTS:   Eval: 5x sit to stand no hands: 20.23 sec  03/13/2023: 3 Minute Walk Test:  459 ft  GAIT: Comments: no assistive device; decreased gait speed  TODAY'S TREATMENT:                                                                                                                             DATE: 03/16/2023 NuStep Level 1 6 mins Seated hamstring stretch 2x20 sec bilat  Seated piriformis stretch 2x20 sec bilat Posterior Pelvic Tilts x 10 Alt hand and knee press + TA contraction x 10 each  Glute Bridges 2 x 5 Seated Hip Adduction ball squeeze x 10 Seated Hip Abduction Blue loop 2 x 10 4 in Step Ups x 10 each   03/13/2023 Nustep level 1 x5 min with PT present to discuss status Seated hamstring stretch 2x20 sec bilat (with cuing for technique) Seated piriformis stretch 2x20 sec bilat Sit to/from stand 2x5 without UE use 3 minute walk around PT gym without assistive device Supine lower trunk rotation x5 Supine posterior pelvic tilt x5 Side-lying clamshell x5 bilat Trigger Point Dry-Needling  Treatment instructions: Expect mild to moderate muscle soreness. S/S of pneumothorax if dry needled over a lung field, and to seek immediate medical attention should they occur. Patient verbalized understanding of these instructions and education. Patient Consent Given: Yes Education handout provided: Yes Muscles treated: right lumbar multifidi, right piriformis Electrical stimulation performed: No Parameters: N/A Treatment response/outcome: Performed in right side-lying secondary to patient with difficulty getting into prone position.  Utilized skilled palpation to identify bony landmarks and trigger points.  Able to illicit twitch response and muscle elongation.  Soft tissue mobilization following to  further promote tissue elongation.      PATIENT EDUCATION:  Education details: Educated patient on anatomy and physiology of current symptoms, prognosis, plan of care as well as initial self care strategies to promote recovery; pt given info on dry needling Person educated: Patient Education method: Explanation Education comprehension: verbalized understanding  HOME EXERCISE PROGRAM: Access Code: [email or web address] URL: [email or web address] Date: 03/16/2023 Prepared by: Clydie Braun Menke  Exercises - Seated Hamstring Stretch - 1 x daily - 7 x weekly - 2 reps - 20 sec hold - Seated Piriformis Stretch with Trunk Bend - 1 x daily - 7 x weekly - 2 reps - 20 sec hold - Seated Piriformis Stretch - 1 x daily - 7 x weekly - 2 reps - 20 sec hold - Sit to Stand - 1 x daily - 7 x weekly - 2 sets -  5 reps - Supine Lower Trunk Rotation - 1 x daily - 7 x weekly - 1 sets - 10 reps - Supine Posterior Pelvic Tilt - 1 x daily - 7 x weekly - 2 sets - 10 reps - Clamshell - 1 x daily - 7 x weekly - 2 sets - 10 reps - Seated Hip Abduction with Resistance - 1 x daily - 7 x weekly - 2 sets - 10 reps - Seated Hip Adduction Squeeze with Ball - 1 x daily - 7 x weekly - 2 sets - 10 reps - Step Up - 1 x daily - 7 x weekly - 1-2 sets - 10 reps   ASSESSMENT:  CLINICAL IMPRESSION: Today's treatment session focused on hip mobility and strengthening. Incorporated supine bridges into treatment session and patient required verbal cues for form correction to engage glutes and hamstrings and not compensate with quads & hip flexors. Progressed patient's core exercises to include movement of LEs.Patient tolerated treatment session well and did not verbalize any increased pain. Updated patient's HEP to include seated hip strengthening exercises. Patient will benefit from skilled PT to address the below impairments and improve overall function.   OBJECTIVE IMPAIRMENTS: decreased activity tolerance, difficulty walking,  decreased strength, increased fascial restrictions, impaired perceived functional ability, and pain.   ACTIVITY LIMITATIONS: carrying, lifting, sitting, standing, locomotion level, and caring for others  PARTICIPATION LIMITATIONS: meal prep, shopping, and occupation  PERSONAL FACTORS: Time since onset of injury/illness/exacerbation and 1-2 comorbidities: osteoporosis  are also affecting patient's functional outcome.   REHAB POTENTIAL: Good  CLINICAL DECISION MAKING: Stable/uncomplicated  EVALUATION COMPLEXITY: Low   GOALS: Goals reviewed with patient? Yes  SHORT TERM GOALS: Target date: 04/05/2023    The patient will demonstrate knowledge of basic self care strategies and exercises to promote healing  Baseline: Goal status: Ongoing  2.  Improved LE strength with 5x STS improved to 15 sec Baseline:  Goal status: Ongoing  3.  The patient will have improved trunk flexor and extensor muscle strength to at least 4+/5 needed for lifting medium weight objects such as grocery bags, dog food, cello Baseline:  Goal status: INITIAL  4.  Improved lumbar and hip mobility needed to bend/stoop for cleaning the cat litter box Baseline:  Goal status: INITIAL      LONG TERM GOALS: Target date: 05/03/2023   The patient will be independent in a safe self progression of a home exercise program and walking program to promote further recovery of function  Baseline:  Goal status: INITIAL  2.  The patient will report a 70% improvement in pain levels with functional activities which are currently difficult including standing and walking her dog Baseline:  Goal status: INITIAL  3.  The patient will have improved hip strength to at least 4+/5 needed for standing, walking longer distances and descending stairs at home and in the community  Baseline:  Goal status: INITIAL  4.  The patient will have improved gait stamina and speed needed to ambulate 600 feet in 6 minutes  Baseline:  Goal  status: INITIAL  5.  The patient will have improved FOTO score to    58%   indicating improved function with less pain  Baseline:  Goal status: INITIAL   PLAN:  PT FREQUENCY: 2x/week  PT DURATION: 8 weeks  PLANNED INTERVENTIONS: Therapeutic exercises, Therapeutic activity, Neuromuscular re-education, Gait training, Patient/Family education, Self Care, Joint mobilization, Aquatic Therapy, Dry Needling, Electrical stimulation, Cryotherapy, Moist heat, Taping, Ultrasound, Ionotophoresis 4mg /ml Dexamethasone, Manual therapy,  and Re-evaluation.  PLAN FOR NEXT SESSION: continue core and hip strengthening; manual/ DN as needed  Claude Manges, PT 03/16/23 12:33 PM  [facility] [address], [address] [place], [place] [address] Phone # [phone] Fax [phone]

## 2023-03-19 ENCOUNTER — Encounter: Payer: Self-pay | Admitting: Physical Therapy

## 2023-03-19 ENCOUNTER — Ambulatory Visit: Payer: Medicare PPO | Admitting: Physical Therapy

## 2023-03-19 ENCOUNTER — Encounter: Payer: Medicare PPO | Admitting: Physical Therapy

## 2023-03-19 DIAGNOSIS — M6281 Muscle weakness (generalized): Secondary | ICD-10-CM | POA: Diagnosis not present

## 2023-03-19 DIAGNOSIS — M5459 Other low back pain: Secondary | ICD-10-CM

## 2023-03-19 DIAGNOSIS — R262 Difficulty in walking, not elsewhere classified: Secondary | ICD-10-CM | POA: Diagnosis not present

## 2023-03-19 DIAGNOSIS — M25552 Pain in left hip: Secondary | ICD-10-CM

## 2023-03-19 DIAGNOSIS — R252 Cramp and spasm: Secondary | ICD-10-CM | POA: Diagnosis not present

## 2023-03-19 NOTE — Therapy (Signed)
OUTPATIENT PHYSICAL THERAPY TREATMENT NOTE   Patient Name: Sheila Anderson MRN: [id number] DOB:1940/08/13, 82 y.o., female Today's Date: 03/19/2023  END OF SESSION:  PT End of Session - 03/19/23 0926     Visit Number 4    Date for PT Re-Evaluation 05/03/23    Authorization Type [employer]    Authorization Time Period 03/08/2023 - 05/03/2023    Authorization - Visit Number 3    Authorization - Number of Visits 16    Progress Note Due on Visit 10    PT Start Time 0930    PT Stop Time 1015    PT Time Calculation (min) 45 min    Activity Tolerance Patient tolerated treatment well;Patient limited by pain    Behavior During Therapy [facility] for tasks assessed/performed               Past Medical History:  Diagnosis Date   Anxiety    Chronic cough    05-29-2019  per pt due to allergies and reflux   Cystocele with uterine prolapse    DDD (degenerative disc disease), lumbar    Eczema    Endometrial polyp    GERD (gastroesophageal reflux disease)    Hiatal hernia    History of colon polyps    History of echocardiogram    03-26-2015   ef 60-65%   History of exercise stress test    03-26-2015 in epic ,  unable to rule out ischemia due to poor exercise   History of gastric polyp    01-17-2019   duodenol polyp (low grade hyperplasia)   History of vertebral compression fracture    2017 -- L1   OA (osteoarthritis)    OAB (overactive bladder)    PMB (postmenopausal bleeding)    Pulmonary nodule    05-29-2019 was told seen in CT imaging 12-11-2018   Rash    eyelids   Seasonal allergies    Past Surgical History:  Procedure Laterality Date   CATARACT EXTRACTION W/ INTRAOCULAR LENS  IMPLANT, BILATERAL  2015  approx.   CYSTOSCOPY  2010   approx.   w/ removal bladder cyst  (per pt benign)   DILATATION & CURETTAGE/HYSTEROSCOPY WITH MYOSURE N/A 05/30/2019   Procedure: DILATATION & CURETTAGE/HYSTEROSCOPY WITH MYOSURE;  Surgeon: Genia Del, MD;  Location: [facility];  Service: Gynecology;  Laterality: N/A;   ESOPHAGOGASTRODUODENOSCOPY (EGD) WITH PROPOFOL N/A 01/17/2019   Procedure: ESOPHAGOGASTRODUODENOSCOPY (EGD) WITH PROPOFOL;  Surgeon: Jeani Hawking, MD;  Location: [facility];  Service: Endoscopy;  Laterality: N/A;   EYE SURGERY Left 07-26-2006   @[facility]    for preretinal fibrosis   HEMOSTASIS CLIP PLACEMENT  01/17/2019   Procedure: HEMOSTASIS CLIP PLACEMENT;  Surgeon: Jeani Hawking, MD;  Location: [facility];  Service: Endoscopy;;   LAPAROSCOPIC PARTIAL RIGHT COLECTOMY  02-04-2010   dr Andrey Campanile @[facility]    large polyps   POLYPECTOMY  01/17/2019   Procedure: POLYPECTOMY;  Surgeon: Jeani Hawking, MD;  Location: [facility];  Service: Endoscopy;;   REVERSE SHOULDER ARTHROPLASTY Right 03/10/2016   Procedure: RIGHT REVERSE SHOULDER ARTHROPLASTY;  Surgeon: Beverely Low, MD;  Location: [facility];  Service: Orthopedics;  Laterality: Right;   SUBMUCOSAL LIFTING INJECTION  01/17/2019   Procedure: SUBMUCOSAL LIFTING INJECTION;  Surgeon: Jeani Hawking, MD;  Location: [facility];  Service: Endoscopy;;   Patient Active Problem List   Diagnosis Date Noted   S/P shoulder replacement 03/10/2016   Lumbosacral radiculopathy 02/09/2016    PCP: Georgina Quint MD  REFERRING PROVIDER: Cleophas Dunker,  Lurena Joiner MD  REFERRING DIAG: left hip OA, lumbar spondylitis with bil radiculopathy; pain left buttock  Rationale for Evaluation and Treatment: Rehabilitation  THERAPY DIAG:  Pain in left hip  Other low back pain  Muscle weakness (generalized)  ONSET DATE: 2 months ago  SUBJECTIVE:                                                                                                                                                                                           SUBJECTIVE STATEMENT: Today I woke up with cramping into both legs and feet.  I would like to do more DN today.   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 Yes NPRS scale: 5/10 dull ache Pain location: left buttock; central LBP; left posterior thigh  Aggravating factors: lying down to go to sleep; present with walking; avoids vacumning; standing; limits dog food to 15# Relieving factors: can sleep on left side; supine with left leg straight, right leg bent or reverse    PRECAUTIONS: None    WEIGHT BEARING RESTRICTIONS: No  FALLS:  Has patient fallen in last 6 months? 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 [employer]  NEXT MD VISIT: Oct 30th  OBJECTIVE:   DIAGNOSTIC FINDINGS:  DEXA scan Jul 08, 2021: right femur neck Tscore -2.20, left femur neck -2.90; spine -.50  PATIENT SURVEYS:  Eval:  FOTO 48%  COGNITION: Overall cognitive status: Within functional limits for tasks assessed      MUSCLE LENGTH: Maisie Fus test: Right 15 deg; Left 10 deg   PALPATION: Tender points in left gluteals and piriformis  LUMBAR ROM:   AROM eval  Flexion Fingers to mid shin  Extension 50% limited  Right lateral flexion WFLS  Left lateral flexion Produced left radicular thigh pain  Right rotation   Left rotation    (Blank rows = not tested)  TRUNK STRENGTH:  Decreased activation of transverse abdominus muscles; abdominals 4-/5; decreased activation of lumbar multifidi; trunk extensors 4-/5     LOWER EXTREMITY MMT:  pelvic drop with SLS left > right  MMT Right eval Left  eval  Hip flexion 4 4  Hip extension 4 4  Hip abduction    Hip adduction 3 3-  Hip internal rotation    Hip external  rotation    Knee flexion    Knee extension 4 4  Ankle dorsiflexion 4 4  Ankle plantarflexion 4 4  Ankle inversion    Ankle eversion     (Blank rows = not tested)  LUMBAR SPECIAL TESTS:  Negative slump test  FUNCTIONAL TESTS:   Eval: 5x sit to stand no hands: 20.23 sec  03/13/2023: 3 Minute Walk Test:  459 ft  GAIT: Comments: no assistive device; decreased gait speed  TODAY'S TREATMENT:                                                                                                                             DATE:  03/19/23: NuStep L3 x 2' secondary to Lt leg cramping increase Knee flexion and hip flexor stretch foot on 2nd step rocking in/out x 8 each LE Standing calf stretch rockerboard 2x15" bil Seated physioball flexion and flexion/SB x5 each Supine LTR 3x10" bil Supine hooklying HS stretch holding behind thigh 2x30" bil Alt hand and knee press + TA contraction 5x5" each  - Pt reported Lt LE numbness Seated ball squeeze 5x5" Seated yellow band hip abd x10 Sit to stand from mat table with hip hinge and forward UE reach x10 Manual therapy: in Rt SL STM to Lt lateral and posterior hip, lumbar Lt flank, SI joint Trigger Point Dry-Needling  Treatment instructions: Expect mild to moderate muscle soreness. S/S of pneumothorax if dry needled over a lung field, and to seek immediate medical attention should they occur. Patient verbalized understanding of these instructions and education.  Patient Consent Given: Yes Education handout provided: Previously provided Muscles treated: Lt glut min, glut med, piriformis Electrical stimulation performed: No Parameters: N/A Treatment response/outcome: deep ache and release of tissue tone   03/16/2023 NuStep Level 1 6 mins Seated hamstring stretch 2x20 sec bilat  Seated piriformis stretch 2x20 sec bilat Posterior Pelvic Tilts x 10 Alt hand and knee press + TA contraction x 10 each  Glute Bridges 2 x 5 Seated Hip Adduction ball  squeeze x 10 Seated Hip Abduction Blue loop 2 x 10 4 in Step Ups x 10 each   03/13/2023 Nustep level 1 x5 min with PT present to discuss status Seated hamstring stretch 2x20 sec bilat (with cuing for technique) Seated piriformis stretch 2x20 sec bilat Sit to/from stand 2x5 without UE use 3 minute walk around PT gym without assistive device Supine lower trunk rotation x5 Supine posterior pelvic tilt x5 Side-lying clamshell x5 bilat Trigger Point Dry-Needling  Treatment instructions: Expect mild to moderate muscle soreness. S/S of pneumothorax if dry needled over a lung field, and to seek immediate medical attention should they occur. Patient verbalized understanding of these instructions and education. Patient Consent Given: Yes Education handout provided: Yes Muscles treated: right lumbar  multifidi, right piriformis Electrical stimulation performed: No Parameters: N/A Treatment response/outcome: Performed in right side-lying secondary to patient with difficulty getting into prone position.  Utilized skilled palpation to identify bony landmarks and trigger points.  Able to illicit twitch response and muscle elongation.  Soft tissue mobilization following to further promote tissue elongation.      PATIENT EDUCATION:  Education details: Educated patient on anatomy and physiology of current symptoms, prognosis, plan of care as well as initial self care strategies to promote recovery; pt given info on dry needling Person educated: Patient Education method: Explanation Education comprehension: verbalized understanding  HOME EXERCISE PROGRAM: Access Code: [email or web address] URL: [email or web address] Date: 03/16/2023 Prepared by: Clydie Braun Menke  Exercises - Seated Hamstring Stretch - 1 x daily - 7 x weekly - 2 reps - 20 sec hold - Seated Piriformis Stretch with Trunk Bend - 1 x daily - 7 x weekly - 2 reps - 20 sec hold - Seated Piriformis Stretch - 1 x daily - 7 x weekly - 2 reps  - 20 sec hold - Sit to Stand - 1 x daily - 7 x weekly - 2 sets - 5 reps - Supine Lower Trunk Rotation - 1 x daily - 7 x weekly - 1 sets - 10 reps - Supine Posterior Pelvic Tilt - 1 x daily - 7 x weekly - 2 sets - 10 reps - Clamshell - 1 x daily - 7 x weekly - 2 sets - 10 reps - Seated Hip Abduction with Resistance - 1 x daily - 7 x weekly - 2 sets - 10 reps - Seated Hip Adduction Squeeze with Ball - 1 x daily - 7 x weekly - 2 sets - 10 reps - Step Up - 1 x daily - 7 x weekly - 1-2 sets - 10 reps   ASSESSMENT:  CLINICAL IMPRESSION: Pt reports she awoke with new onset of bil LE cramping down posterior thighs into feet today.  She had brief onset of Lt LE numbness during supine therex which resolved when she sat up.  She continues to have signif TP and tenderness along Lt lateral and posterior hip which responded well to DN today.  She stated today's responses of the muscles around the needles were more intense than the past sessions.  Pt will continue to benefit from ongoing assessment of symptoms and response to treatment interventions.   OBJECTIVE IMPAIRMENTS: decreased activity tolerance, difficulty walking, decreased strength, increased fascial restrictions, impaired perceived functional ability, and pain.   ACTIVITY LIMITATIONS: carrying, lifting, sitting, standing, locomotion level, and caring for others  PARTICIPATION LIMITATIONS: meal prep, shopping, and occupation  PERSONAL FACTORS: Time since onset of injury/illness/exacerbation and 1-2 comorbidities: osteoporosis  are also affecting patient's functional outcome.   REHAB POTENTIAL: Good  CLINICAL DECISION MAKING: Stable/uncomplicated  EVALUATION COMPLEXITY: Low   GOALS: Goals reviewed with patient? Yes  SHORT TERM GOALS: Target date: 04/05/2023    The patient will demonstrate knowledge of basic self care strategies and exercises to promote healing  Baseline: Goal status: Ongoing  2.  Improved LE strength with 5x STS  improved to 15 sec Baseline:  Goal status: Ongoing  3.  The patient will have improved trunk flexor and extensor muscle strength to at least 4+/5 needed for lifting medium weight objects such as grocery bags, dog food, cello Baseline:  Goal status: INITIAL  4.  Improved lumbar and hip mobility needed to bend/stoop for cleaning the cat litter box Baseline:  Goal status: INITIAL  LONG TERM GOALS: Target date: 05/03/2023   The patient will be independent in a safe self progression of a home exercise program and walking program to promote further recovery of function  Baseline:  Goal status: INITIAL  2.  The patient will report a 70% improvement in pain levels with functional activities which are currently difficult including standing and walking her dog Baseline:  Goal status: INITIAL  3.  The patient will have improved hip strength to at least 4+/5 needed for standing, walking longer distances and descending stairs at home and in the community  Baseline:  Goal status: INITIAL  4.  The patient will have improved gait stamina and speed needed to ambulate 600 feet in 6 minutes  Baseline:  Goal status: INITIAL  5.  The patient will have improved FOTO score to    58%   indicating improved function with less pain  Baseline:  Goal status: INITIAL   PLAN:  PT FREQUENCY: 2x/week  PT DURATION: 8 weeks  PLANNED INTERVENTIONS: Therapeutic exercises, Therapeutic activity, Neuromuscular re-education, Gait training, Patient/Family education, Self Care, Joint mobilization, Aquatic Therapy, Dry Needling, Electrical stimulation, Cryotherapy, Moist heat, Taping, Ultrasound, Ionotophoresis 4mg /ml Dexamethasone, Manual therapy, and Re-evaluation.  PLAN FOR NEXT SESSION: f/u on DN to Lt lateral hip, have LE cramps and numbness continued?  continue core and hip strengthening; manual/ DN as needed  Morton Peters, PT 03/19/23 12:06 PM   [facility] [address], [address] [place], [place] [address] Phone # [phone] Fax [phone]

## 2023-03-21 ENCOUNTER — Ambulatory Visit: Payer: Medicare PPO | Admitting: Physical Therapy

## 2023-03-21 ENCOUNTER — Encounter: Payer: Self-pay | Admitting: Physical Therapy

## 2023-03-21 DIAGNOSIS — M5459 Other low back pain: Secondary | ICD-10-CM

## 2023-03-21 DIAGNOSIS — M25552 Pain in left hip: Secondary | ICD-10-CM | POA: Diagnosis not present

## 2023-03-21 DIAGNOSIS — R252 Cramp and spasm: Secondary | ICD-10-CM | POA: Diagnosis not present

## 2023-03-21 DIAGNOSIS — M6281 Muscle weakness (generalized): Secondary | ICD-10-CM | POA: Diagnosis not present

## 2023-03-21 DIAGNOSIS — R262 Difficulty in walking, not elsewhere classified: Secondary | ICD-10-CM | POA: Diagnosis not present

## 2023-03-21 NOTE — Therapy (Signed)
OUTPATIENT PHYSICAL THERAPY TREATMENT NOTE   Patient Name: Sheila Anderson MRN: [id number] DOB:1940-10-16, 82 y.o., female Today's Date: 03/21/2023  END OF SESSION:  PT End of Session - 03/21/23 1234     Visit Number 5    Date for PT Re-Evaluation 05/03/23    Authorization Type [employer]    Authorization Time Period 03/08/2023 - 05/03/2023    Authorization - Visit Number 4    Authorization - Number of Visits 16    Progress Note Due on Visit 10    PT Start Time 1151    PT Stop Time 1230    PT Time Calculation (min) 39 min    Activity Tolerance Patient tolerated treatment well    Behavior During Therapy [facility] for tasks assessed/performed                Past Medical History:  Diagnosis Date   Anxiety    Chronic cough    05-29-2019  per pt due to allergies and reflux   Cystocele with uterine prolapse    DDD (degenerative disc disease), lumbar    Eczema    Endometrial polyp    GERD (gastroesophageal reflux disease)    Hiatal hernia    History of colon polyps    History of echocardiogram    03-26-2015   ef 60-65%   History of exercise stress test    03-26-2015 in epic ,  unable to rule out ischemia due to poor exercise   History of gastric polyp    01-17-2019   duodenol polyp (low grade hyperplasia)   History of vertebral compression fracture    2017 -- L1   OA (osteoarthritis)    OAB (overactive bladder)    PMB (postmenopausal bleeding)    Pulmonary nodule    05-29-2019 was told seen in CT imaging 12-11-2018   Rash    eyelids   Seasonal allergies    Past Surgical History:  Procedure Laterality Date   CATARACT EXTRACTION W/ INTRAOCULAR LENS  IMPLANT, BILATERAL  2015  approx.   CYSTOSCOPY  2010   approx.   w/ removal bladder cyst  (per pt benign)   DILATATION & CURETTAGE/HYSTEROSCOPY WITH MYOSURE N/A 05/30/2019   Procedure: DILATATION & CURETTAGE/HYSTEROSCOPY WITH MYOSURE;  Surgeon: Genia Del, MD;  Location[facility];  Service:  Gynecology;  Laterality: N/A;   ESOPHAGOGASTRODUODENOSCOPY (EGD) WITH PROPOFOL N/A 01/17/2019   Procedure: ESOPHAGOGASTRODUODENOSCOPY (EGD) WITH PROPOFOL;  Surgeon: Jeani Hawking, MD;  Location: [facility];  Service: Endoscopy;  Laterality: N/A;   EYE SURGERY Left 07-26-2006   @[facility]    for preretinal fibrosis   HEMOSTASIS CLIP PLACEMENT  01/17/2019   Procedure: HEMOSTASIS CLIP PLACEMENT;  Surgeon: Jeani Hawking, MD;  Location: [facility];  Service: Endoscopy;;   LAPAROSCOPIC PARTIAL RIGHT COLECTOMY  02-04-2010   dr Andrey Campanile @[facility]    large polyps   POLYPECTOMY  01/17/2019   Procedure: POLYPECTOMY;  Surgeon: Jeani Hawking, MD;  Location: [facility];  Service: Endoscopy;;   REVERSE SHOULDER ARTHROPLASTY Right 03/10/2016   Procedure: RIGHT REVERSE SHOULDER ARTHROPLASTY;  Surgeon: Beverely Low, MD;  Location: [facility] OR;  Service: Orthopedics;  Laterality: Right;   SUBMUCOSAL LIFTING INJECTION  01/17/2019   Procedure: SUBMUCOSAL LIFTING INJECTION;  Surgeon: Jeani Hawking, MD;  Location: [facility];  Service: Endoscopy;;   Patient Active Problem List   Diagnosis Date Noted   S/P shoulder replacement 03/10/2016   Lumbosacral radiculopathy 02/09/2016    PCP: Georgina Quint MD  REFERRING PROVIDER: Albertha Ghee MD  REFERRING DIAG: left hip OA, lumbar spondylitis with bil radiculopathy; pain left buttock  Rationale for Evaluation and Treatment: Rehabilitation  THERAPY DIAG:  Pain in left hip  Other low back pain  Muscle weakness (generalized)  ONSET DATE: 2 months ago  SUBJECTIVE:                                                                                                                                                                                           SUBJECTIVE STATEMENT: Patient reports she is very sore today. She was not a fan of dry needling last treatment session because of the intense muscle twitches. Her pain is currently 5/10.    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 Yes NPRS scale: 5/10 dull ache Pain location: left buttock; central LBP; left posterior thigh  Aggravating factors: lying down to go to sleep; present with walking; avoids vacumning; standing; limits dog food to 15# Relieving factors: can sleep on left side; supine with left leg straight, right leg bent or reverse    PRECAUTIONS: None    WEIGHT BEARING RESTRICTIONS: No  FALLS:  Has patient fallen in last 6 months? 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 [employer]  NEXT MD VISIT: Oct 30th  OBJECTIVE:   DIAGNOSTIC FINDINGS:  DEXA scan Jul 08, 2021: right femur neck Tscore -2.20, left femur neck -2.90; spine -.50  PATIENT SURVEYS:  Eval:  FOTO 48%  COGNITION: Overall cognitive status: Within functional limits for tasks assessed      MUSCLE LENGTH: Maisie Fus test: Right 15 deg; Left 10 deg   PALPATION: Tender points in left gluteals and piriformis  LUMBAR ROM:   AROM eval  Flexion Fingers to mid shin  Extension 50% limited  Right lateral flexion WFLS  Left lateral flexion Produced left radicular thigh pain  Right rotation   Left rotation    (Blank rows = not tested)  TRUNK STRENGTH:  Decreased activation of transverse abdominus muscles; abdominals 4-/5; decreased activation of lumbar multifidi; trunk extensors 4-/5     LOWER EXTREMITY MMT:  pelvic drop with SLS left >  right  MMT Right eval Left eval  Hip flexion 4 4  Hip extension 4 4  Hip abduction    Hip  adduction 3 3-  Hip internal rotation    Hip external rotation    Knee flexion    Knee extension 4 4  Ankle dorsiflexion 4 4  Ankle plantarflexion 4 4  Ankle inversion    Ankle eversion     (Blank rows = not tested)  LUMBAR SPECIAL TESTS:  Negative slump test  FUNCTIONAL TESTS:   Eval: 5x sit to stand no hands: 20.23 sec  03/13/2023: 3 Minute Walk Test:  459 ft  GAIT: Comments: no assistive device; decreased gait speed  TODAY'S TREATMENT:                                                                                                                             DATE:  03/21/23: NuStep L3 x 5 minutes- PT present to discuss status  3 way SB stretch x 8 each Seated hamstring stretch 2 x 30 sec Seated piriformis stretch 2 x 30 sec Supine LTR x 10 each Posterior Pelvic Tilts x 10 Alt hand and knee press + TA contraction x 10 each  Supine hip flexion stretch 3 x 15 sec Manual therapy: Attaday bilateral lumbar muscles & Lt glutes   03/19/23: NuStep L3 x 2' secondary to Lt leg cramping increase Knee flexion and hip flexor stretch foot on 2nd step rocking in/out x 8 each LE Standing calf stretch rockerboard 2x15" bil Seated physioball flexion and flexion/SB x5 each Supine LTR 3x10" bil Supine hooklying HS stretch holding behind thigh 2x30" bil Alt hand and knee press + TA contraction 5x5" each  - Pt reported Lt LE numbness Seated ball squeeze 5x5" Seated yellow band hip abd x10 Sit to stand from mat table with hip hinge and forward UE reach x10 Manual therapy: in Rt SL STM to Lt lateral and posterior hip, lumbar Lt flank, SI joint Trigger Point Dry-Needling  Treatment instructions: Expect mild to moderate muscle soreness. S/S of pneumothorax if dry needled over a lung field, and to seek immediate medical attention should they occur. Patient verbalized understanding of these instructions and education.  Patient Consent Given: Yes Education handout provided: Previously  provided Muscles treated: Lt glut min, glut med, piriformis Electrical stimulation performed: No Parameters: N/A Treatment response/outcome: deep ache and release of tissue tone  03/16/2023 NuStep Level 1 6 mins Seated hamstring stretch 2x20 sec bilat  Seated piriformis stretch 2x20 sec bilat Posterior Pelvic Tilts x 10 Alt hand and knee press + TA contraction x 10 each  Glute Bridges 2 x 5 Seated Hip Adduction ball squeeze x 10 Seated Hip Abduction Blue loop 2 x 10 4 in Step Ups x 10 each    PATIENT EDUCATION:  Education details: Educated patient on anatomy and physiology of current symptoms, prognosis, plan of care as well as initial self care strategies to promote recovery; pt  given info on dry needling Person educated: Patient Education method: Explanation Education comprehension: verbalized understanding  HOME EXERCISE PROGRAM: Access Code: [email or web address] URL: [email or web address] Date: 03/16/2023 Prepared by: Clydie Braun Menke  Exercises - Seated Hamstring Stretch - 1 x daily - 7 x weekly - 2 reps - 20 sec hold - Seated Piriformis Stretch with Trunk Bend - 1 x daily - 7 x weekly - 2 reps - 20 sec hold - Seated Piriformis Stretch - 1 x daily - 7 x weekly - 2 reps - 20 sec hold - Sit to Stand - 1 x daily - 7 x weekly - 2 sets - 5 reps - Supine Lower Trunk Rotation - 1 x daily - 7 x weekly - 1 sets - 10 reps - Supine Posterior Pelvic Tilt - 1 x daily - 7 x weekly - 2 sets - 10 reps - Clamshell - 1 x daily - 7 x weekly - 2 sets - 10 reps - Seated Hip Abduction with Resistance - 1 x daily - 7 x weekly - 2 sets - 10 reps - Seated Hip Adduction Squeeze with Ball - 1 x daily - 7 x weekly - 2 sets - 10 reps - Step Up - 1 x daily - 7 x weekly - 1-2 sets - 10 reps   ASSESSMENT:  CLINICAL IMPRESSION: Patient reports her back is more painful and sore today. She still has been experiencing her leg cramping and achy pain down her Lt leg. She has had dry needling in the past,  but last treatment session she experienced more intense muscle twitches in her piriformis which caused increased muscle soreness. Patient required moderate verbal and tactile cues for form correction and modifications to alleviate pain. Patient experienced occasional hamstring cramping while performing exercises. Patient will benefit from skilled PT to address the below impairments and improve overall function.   OBJECTIVE IMPAIRMENTS: decreased activity tolerance, difficulty walking, decreased strength, increased fascial restrictions, impaired perceived functional ability, and pain.   ACTIVITY LIMITATIONS: carrying, lifting, sitting, standing, locomotion level, and caring for others  PARTICIPATION LIMITATIONS: meal prep, shopping, and occupation  PERSONAL FACTORS: Time since onset of injury/illness/exacerbation and 1-2 comorbidities: osteoporosis  are also affecting patient's functional outcome.   REHAB POTENTIAL: Good  CLINICAL DECISION MAKING: Stable/uncomplicated  EVALUATION COMPLEXITY: Low   GOALS: Goals reviewed with patient? Yes  SHORT TERM GOALS: Target date: 04/05/2023    The patient will demonstrate knowledge of basic self care strategies and exercises to promote healing  Baseline: Goal status: Ongoing  2.  Improved LE strength with 5x STS improved to 15 sec Baseline:  Goal status: Ongoing  3.  The patient will have improved trunk flexor and extensor muscle strength to at least 4+/5 needed for lifting medium weight objects such as grocery bags, dog food, cello Baseline:  Goal status: INITIAL  4.  Improved lumbar and hip mobility needed to bend/stoop for cleaning the cat litter box Baseline:  Goal status: INITIAL      LONG TERM GOALS: Target date: 05/03/2023   The patient will be independent in a safe self progression of a home exercise program and walking program to promote further recovery of function  Baseline:  Goal status: INITIAL  2.  The patient  will report a 70% improvement in pain levels with functional activities which are currently difficult including standing and walking her dog Baseline:  Goal status: INITIAL  3.  The patient will have improved hip strength to at least 4+/5 needed for standing, walking  longer distances and descending stairs at home and in the community  Baseline:  Goal status: INITIAL  4.  The patient will have improved gait stamina and speed needed to ambulate 600 feet in 6 minutes  Baseline:  Goal status: INITIAL  5.  The patient will have improved FOTO score to    58%   indicating improved function with less pain  Baseline:  Goal status: INITIAL   PLAN:  PT FREQUENCY: 2x/week  PT DURATION: 8 weeks  PLANNED INTERVENTIONS: Therapeutic exercises, Therapeutic activity, Neuromuscular re-education, Gait training, Patient/Family education, Self Care, Joint mobilization, Aquatic Therapy, Dry Needling, Electrical stimulation, Cryotherapy, Moist heat, Taping, Ultrasound, Ionotophoresis 4mg /ml Dexamethasone, Manual therapy, and Re-evaluation.  PLAN FOR NEXT SESSION: assess response to treat session (cramping/ numbness?)  continue core and hip strengthening   Claude Manges, PT 03/21/23 12:35 PM   [facility] [address], [address] [place], [place] [address] Phone # [phone] Fax [phone]

## 2023-03-26 ENCOUNTER — Ambulatory Visit: Payer: Medicare PPO | Admitting: Physical Therapy

## 2023-03-26 ENCOUNTER — Encounter: Payer: Self-pay | Admitting: Physical Therapy

## 2023-03-26 DIAGNOSIS — M6281 Muscle weakness (generalized): Secondary | ICD-10-CM | POA: Diagnosis not present

## 2023-03-26 DIAGNOSIS — R262 Difficulty in walking, not elsewhere classified: Secondary | ICD-10-CM | POA: Diagnosis not present

## 2023-03-26 DIAGNOSIS — M25552 Pain in left hip: Secondary | ICD-10-CM | POA: Diagnosis not present

## 2023-03-26 DIAGNOSIS — M5459 Other low back pain: Secondary | ICD-10-CM

## 2023-03-26 DIAGNOSIS — R252 Cramp and spasm: Secondary | ICD-10-CM | POA: Diagnosis not present

## 2023-03-26 NOTE — Therapy (Signed)
OUTPATIENT PHYSICAL THERAPY TREATMENT NOTE   Patient Name: Sheila Anderson MRN: [id number] DOB:September 22, 1940, 82 y.o., female Today's Date: 03/26/2023  END OF SESSION:  PT End of Session - 03/26/23 1320     Visit Number 6    Date for PT Re-Evaluation 05/03/23    Authorization Type [employer]    Authorization Time Period 03/08/2023 - 05/03/2023    Authorization - Number of Visits 16    Progress Note Due on Visit 10    PT Start Time 1233    PT Stop Time 1313    PT Time Calculation (min) 40 min    Activity Tolerance Patient tolerated treatment well    Behavior During Therapy [facility] for tasks assessed/performed                 Past Medical History:  Diagnosis Date   Anxiety    Chronic cough    05-29-2019  per pt due to allergies and reflux   Cystocele with uterine prolapse    DDD (degenerative disc disease), lumbar    Eczema    Endometrial polyp    GERD (gastroesophageal reflux disease)    Hiatal hernia    History of colon polyps    History of echocardiogram    03-26-2015   ef 60-65%   History of exercise stress test    03-26-2015 in epic ,  unable to rule out ischemia due to poor exercise   History of gastric polyp    01-17-2019   duodenol polyp (low grade hyperplasia)   History of vertebral compression fracture    2017 -- L1   OA (osteoarthritis)    OAB (overactive bladder)    PMB (postmenopausal bleeding)    Pulmonary nodule    05-29-2019 was told seen in CT imaging 12-11-2018   Rash    eyelids   Seasonal allergies    Past Surgical History:  Procedure Laterality Date   CATARACT EXTRACTION W/ INTRAOCULAR LENS  IMPLANT, BILATERAL  2015  approx.   CYSTOSCOPY  2010   approx.   w/ removal bladder cyst  (per pt benign)   DILATATION & CURETTAGE/HYSTEROSCOPY WITH MYOSURE N/A 05/30/2019   Procedure: DILATATION & CURETTAGE/HYSTEROSCOPY WITH MYOSURE;  Surgeon: Genia Del, MD;  Location: [facility];  Service: Gynecology;  Laterality: N/A;    ESOPHAGOGASTRODUODENOSCOPY (EGD) WITH PROPOFOL N/A 01/17/2019   Procedure: ESOPHAGOGASTRODUODENOSCOPY (EGD) WITH PROPOFOL;  Surgeon: Jeani Hawking, MD;  Location: [facility];  Service: Endoscopy;  Laterality: N/A;   EYE SURGERY Left 07-26-2006   @[facility]    for preretinal fibrosis   HEMOSTASIS CLIP PLACEMENT  01/17/2019   Procedure: HEMOSTASIS CLIP PLACEMENT;  Surgeon: Jeani Hawking, MD;  Location: [facility];  Service: Endoscopy;;   LAPAROSCOPIC PARTIAL RIGHT COLECTOMY  02-04-2010   dr Andrey Campanile @[facility]    large polyps   POLYPECTOMY  01/17/2019   Procedure: POLYPECTOMY;  Surgeon: Jeani Hawking, MD;  Location: [facility];  Service: Endoscopy;;   REVERSE SHOULDER ARTHROPLASTY Right 03/10/2016   Procedure: RIGHT REVERSE SHOULDER ARTHROPLASTY;  Surgeon: Beverely Low, MD;  Location: [facility];  Service: Orthopedics;  Laterality: Right;   SUBMUCOSAL LIFTING INJECTION  01/17/2019   Procedure: SUBMUCOSAL LIFTING INJECTION;  Surgeon: Jeani Hawking, MD;  Location: [facility];  Service: Endoscopy;;   Patient Active Problem List   Diagnosis Date Noted   S/P shoulder replacement 03/10/2016   Lumbosacral radiculopathy 02/09/2016    PCP: Georgina Quint MD  REFERRING PROVIDER: Albertha Ghee MD  REFERRING DIAG: left hip OA, lumbar  spondylitis with bil radiculopathy; pain left buttock  Rationale for Evaluation and Treatment: Rehabilitation  THERAPY DIAG:  Pain in left hip  Other low back pain  Muscle weakness (generalized)  ONSET DATE: 2 months ago  SUBJECTIVE:                                                                                                                                                                                           SUBJECTIVE STATEMENT: Patient reports she was very painful yesterday. She had 10/10 back that she experienced in her low back and left side on her hip. 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 Currently her pain is 3/10.  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 Yes NPRS scale: 5/10 dull ache Pain location: left buttock; central LBP; left posterior thigh  Aggravating factors: lying down to go to sleep; present with walking; avoids vacumning; standing; limits dog food to 15# Relieving factors: can sleep on left side; supine with left leg straight, right leg bent or reverse    PRECAUTIONS: None    WEIGHT BEARING RESTRICTIONS: No  FALLS:  Has patient fallen in last 6 months? 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 [employer]  NEXT MD VISIT: Oct 30th  OBJECTIVE:   DIAGNOSTIC FINDINGS:  DEXA scan Jul 08, 2021: right femur neck Tscore -2.20, left femur neck -2.90; spine -.50  PATIENT SURVEYS:  Eval:  FOTO 48%  COGNITION: Overall cognitive status: Within functional limits for tasks assessed      MUSCLE LENGTH: Maisie Fus test: Right 15 deg; Left 10 deg   PALPATION: Tender points in left gluteals and piriformis  LUMBAR ROM:   AROM eval  Flexion Fingers to mid shin  Extension 50% limited  Right lateral flexion WFLS  Left lateral flexion Produced left radicular thigh pain  Right rotation   Left rotation    (Blank rows = not tested)  TRUNK STRENGTH:  Decreased activation of transverse abdominus muscles; abdominals 4-/5; decreased activation of lumbar multifidi; trunk extensors 4-/5  LOWER EXTREMITY MMT:  pelvic drop with SLS left > right  MMT Right eval Left eval   Hip flexion 4 4  Hip extension 4 4  Hip abduction    Hip adduction 3 3-  Hip internal rotation    Hip external rotation    Knee flexion    Knee extension 4 4  Ankle dorsiflexion 4 4  Ankle plantarflexion 4 4  Ankle inversion    Ankle eversion     (Blank rows = not tested)  LUMBAR SPECIAL TESTS:  Negative slump test  FUNCTIONAL TESTS:   Eval: 5x sit to stand no hands: 20.23 sec  03/13/2023: 3 Minute Walk Test:  459 ft  GAIT: Comments: no assistive device; decreased gait speed  TODAY'S TREATMENT:                                                                                                                             DATE:  03/21/23: Manual : Attaday to Lt piriforms and Lt IT band 12 minutes LTR x 10 each way Supine ITB stretch with green strap 3 x 20 sec Supine quad stretch with green strap 3 x 20 sec Posterior Pelvic Tilts x 10 Supine Bent knee fall out + TA contraction x 20 each 3 way stability ball stretch x 8 each way Standing Marching with 2 sec hold x 10 each leg  03/19/23: NuStep L3 x 2' secondary to Lt leg cramping increase Knee flexion and hip flexor stretch foot on 2nd step rocking in/out x 8 each LE Standing calf stretch rockerboard 2x15" bil Seated physioball flexion and flexion/SB x5 each Supine LTR 3x10" bil Supine hooklying HS stretch holding behind thigh 2x30" bil Alt hand and knee press + TA contraction 5x5" each  - Pt reported Lt LE numbness Seated ball squeeze 5x5" Seated yellow band hip abd x10 Sit to stand from mat table with hip hinge and forward UE reach x10 Manual therapy: in Rt SL STM to Lt lateral and posterior hip, lumbar Lt flank, SI joint Trigger Point Dry-Needling  Treatment instructions: Expect mild to moderate muscle soreness. S/S of pneumothorax if dry needled over a lung field, and to seek immediate medical attention should they occur. Patient verbalized understanding of these instructions and education. Patient Consent Given:  Yes Education handout provided: Previously provided Muscles treated: Lt glut min, glut med, piriformis Electrical stimulation performed: No Parameters: N/A Treatment response/outcome: deep ache and release of tissue tone  03/16/2023 NuStep Level 1 6 mins Seated hamstring stretch 2x20 sec bilat  Seated piriformis stretch 2x20 sec bilat Posterior Pelvic Tilts x 10 Alt hand and knee press + TA contraction x 10 each  Glute Bridges 2 x 5 Seated Hip Adduction ball squeeze x 10 Seated Hip Abduction Blue loop 2 x 10 4 in Step Ups x 10 each    PATIENT EDUCATION:  Education details: Educated patient on anatomy and physiology of current symptoms, prognosis, plan of care as well  as initial self care strategies to promote recovery; pt given info on dry needling Person educated: Patient Education method: Explanation Education comprehension: verbalized understanding  HOME EXERCISE PROGRAM: Access Code: [email or web address] URL: [email or web address] Date: 03/16/2023 Prepared by: Clydie Braun Menke  Exercises - Seated Hamstring Stretch - 1 x daily - 7 x weekly - 2 reps - 20 sec hold - Seated Piriformis Stretch with Trunk Bend - 1 x daily - 7 x weekly - 2 reps - 20 sec hold - Seated Piriformis Stretch - 1 x daily - 7 x weekly - 2 reps - 20 sec hold - Sit to Stand - 1 x daily - 7 x weekly - 2 sets - 5 reps - Supine Lower Trunk Rotation - 1 x daily - 7 x weekly - 1 sets - 10 reps - Supine Posterior Pelvic Tilt - 1 x daily - 7 x weekly - 2 sets - 10 reps - Clamshell - 1 x daily - 7 x weekly - 2 sets - 10 reps - Seated Hip Abduction with Resistance - 1 x daily - 7 x weekly - 2 sets - 10 reps - Seated Hip Adduction Squeeze with Ball - 1 x daily - 7 x weekly - 2 sets - 10 reps - Step Up - 1 x daily - 7 x weekly - 1-2 sets - 10 reps   ASSESSMENT:  CLINICAL IMPRESSION: Patient had multiple performances this weekend and she was very painful yesterday. She had 10/10 pain the prevented her from doing  her normal activities. Today she is not having as intense symptoms but  she is having her normal pain in her Lt hip area and tightness along her ITB. These symptoms were alleviated using manual therapy techniques. Patient was able to tolerate treatment session without having increased pain. Verbalized 0/10 pain at end of treatment session. Patient will benefit from skilled PT to address the below impairments and improve overall function.    OBJECTIVE IMPAIRMENTS: decreased activity tolerance, difficulty walking, decreased strength, increased fascial restrictions, impaired perceived functional ability, and pain.   ACTIVITY LIMITATIONS: carrying, lifting, sitting, standing, locomotion level, and caring for others  PARTICIPATION LIMITATIONS: meal prep, shopping, and occupation  PERSONAL FACTORS: Time since onset of injury/illness/exacerbation and 1-2 comorbidities: osteoporosis  are also affecting patient's functional outcome.   REHAB POTENTIAL: Good  CLINICAL DECISION MAKING: Stable/uncomplicated  EVALUATION COMPLEXITY: Low   GOALS: Goals reviewed with patient? Yes  SHORT TERM GOALS: Target date: 04/05/2023    The patient will demonstrate knowledge of basic self care strategies and exercises to promote healing  Baseline: Goal status: Ongoing  2.  Improved LE strength with 5x STS improved to 15 sec Baseline:  Goal status: Ongoing  3.  The patient will have improved trunk flexor and extensor muscle strength to at least 4+/5 needed for lifting medium weight objects such as grocery bags, dog food, cello Baseline:  Goal status: INITIAL  4.  Improved lumbar and hip mobility needed to bend/stoop for cleaning the cat litter box Baseline:  Goal status: INITIAL      LONG TERM GOALS: Target date: 05/03/2023   The patient will be independent in a safe self progression of a home exercise program and walking program to promote further recovery of function  Baseline:  Goal status:  INITIAL  2.  The patient will report a 70% improvement in pain levels with functional activities which are currently difficult including standing and walking her dog Baseline:  Goal status: INITIAL  3.  The patient  will have improved hip strength to at least 4+/5 needed for standing, walking longer distances and descending stairs at home and in the community  Baseline:  Goal status: INITIAL  4.  The patient will have improved gait stamina and speed needed to ambulate 600 feet in 6 minutes  Baseline:  Goal status: INITIAL  5.  The patient will have improved FOTO score to    58%   indicating improved function with less pain  Baseline:  Goal status: INITIAL   PLAN:  PT FREQUENCY: 2x/week  PT DURATION: 8 weeks  PLANNED INTERVENTIONS: Therapeutic exercises, Therapeutic activity, Neuromuscular re-education, Gait training, Patient/Family education, Self Care, Joint mobilization, Aquatic Therapy, Dry Needling, Electrical stimulation, Cryotherapy, Moist heat, Taping, Ultrasound, Ionotophoresis 4mg /ml Dexamethasone, Manual therapy, and Re-evaluation.  PLAN FOR NEXT SESSION: assess response to treat session ; standing core exercises; hip strengthening    Claude Manges, PT 03/26/23 1:21 PM   [facility] [address] [place], [place] [address] Phone # [phone] Fax [phone]

## 2023-03-29 ENCOUNTER — Ambulatory Visit: Payer: Medicare PPO

## 2023-03-29 ENCOUNTER — Encounter: Payer: Medicare PPO | Admitting: Physical Therapy

## 2023-03-29 DIAGNOSIS — M6281 Muscle weakness (generalized): Secondary | ICD-10-CM

## 2023-03-29 DIAGNOSIS — R262 Difficulty in walking, not elsewhere classified: Secondary | ICD-10-CM

## 2023-03-29 DIAGNOSIS — M25552 Pain in left hip: Secondary | ICD-10-CM | POA: Diagnosis not present

## 2023-03-29 DIAGNOSIS — R252 Cramp and spasm: Secondary | ICD-10-CM

## 2023-03-29 DIAGNOSIS — M5459 Other low back pain: Secondary | ICD-10-CM

## 2023-03-29 NOTE — Therapy (Signed)
OUTPATIENT PHYSICAL THERAPY TREATMENT NOTE   Patient Name: Sheila Anderson MRN: [id number] DOB:31-Jan-1941, 82 y.o., female Today's Date: 03/29/2023  END OF SESSION:  PT End of Session - 03/29/23 1110     Visit Number 7    Date for PT Re-Evaluation 05/03/23    Authorization Type [employer]    Authorization Time Period 03/08/2023 - 05/03/2023    Authorization - Visit Number 7    Authorization - Number of Visits 16    Progress Note Due on Visit 10    PT Start Time 1105    PT Stop Time 1200    PT Time Calculation (min) 55 min    Activity Tolerance Patient tolerated treatment well    Behavior During Therapy [facility] for tasks assessed/performed                 Past Medical History:  Diagnosis Date   Anxiety    Chronic cough    05-29-2019  per pt due to allergies and reflux   Cystocele with uterine prolapse    DDD (degenerative disc disease), lumbar    Eczema    Endometrial polyp    GERD (gastroesophageal reflux disease)    Hiatal hernia    History of colon polyps    History of echocardiogram    03-26-2015   ef 60-65%   History of exercise stress test    03-26-2015 in epic ,  unable to rule out ischemia due to poor exercise   History of gastric polyp    01-17-2019   duodenol polyp (low grade hyperplasia)   History of vertebral compression fracture    2017 -- L1   OA (osteoarthritis)    OAB (overactive bladder)    PMB (postmenopausal bleeding)    Pulmonary nodule    05-29-2019 was told seen in CT imaging 12-11-2018   Rash    eyelids   Seasonal allergies    Past Surgical History:  Procedure Laterality Date   CATARACT EXTRACTION W/ INTRAOCULAR LENS  IMPLANT, BILATERAL  2015  approx.   CYSTOSCOPY  2010   approx.   w/ removal bladder cyst  (per pt benign)   DILATATION & CURETTAGE/HYSTEROSCOPY WITH MYOSURE N/A 05/30/2019   Procedure: DILATATION & CURETTAGE/HYSTEROSCOPY WITH MYOSURE;  Surgeon: Genia Del, MD;  Location[facility];   Service: Gynecology;  Laterality: N/A;   ESOPHAGOGASTRODUODENOSCOPY (EGD) WITH PROPOFOL N/A 01/17/2019   Procedure: ESOPHAGOGASTRODUODENOSCOPY (EGD) WITH PROPOFOL;  Surgeon: Jeani Hawking, MD;  Location: [facility];  Service: Endoscopy;  Laterality: N/A;   EYE SURGERY Left 07-26-2006   @[facility]    for preretinal fibrosis   HEMOSTASIS CLIP PLACEMENT  01/17/2019   Procedure: HEMOSTASIS CLIP PLACEMENT;  Surgeon: Jeani Hawking, MD;  Location: [facility];  Service: Endoscopy;;   LAPAROSCOPIC PARTIAL RIGHT COLECTOMY  02-04-2010   dr Andrey Campanile @[facility]    large polyps   POLYPECTOMY  01/17/2019   Procedure: POLYPECTOMY;  Surgeon: Jeani Hawking, MD;  Location: [facility];  Service: Endoscopy;;   REVERSE SHOULDER ARTHROPLASTY Right 03/10/2016   Procedure: RIGHT REVERSE SHOULDER ARTHROPLASTY;  Surgeon: Beverely Low, MD;  Location: [facility] OR;  Service: Orthopedics;  Laterality: Right;   SUBMUCOSAL LIFTING INJECTION  01/17/2019   Procedure: SUBMUCOSAL LIFTING INJECTION;  Surgeon: Jeani Hawking, MD;  Location: [facility];  Service: Endoscopy;;   Patient Active Problem List   Diagnosis Date Noted   S/P shoulder replacement 03/10/2016   Lumbosacral radiculopathy 02/09/2016    PCP: Georgina Quint MD  REFERRING PROVIDER: Albertha Ghee  MD  REFERRING DIAG: left hip OA, lumbar spondylitis with bil radiculopathy; pain left buttock  Rationale for Evaluation and Treatment: Rehabilitation  THERAPY DIAG:  Pain in left hip  Other low back pain  Difficulty in walking, not elsewhere classified  Muscle weakness (generalized)  Cramp and spasm  ONSET DATE: 2 months ago  SUBJECTIVE:                                                                                                                                                                                           SUBJECTIVE STATEMENT: Patient reports she is still having a lot of pain and that her right shoulder has been hurting pretty bad.    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 Yes NPRS scale: 5/10 dull ache Pain location: left buttock; central LBP; left posterior thigh  Aggravating factors: lying down to go to sleep; present with walking; avoids vacumning; standing; limits dog food to 15# Relieving factors: can sleep on left side; supine with left leg straight, right leg bent or reverse    PRECAUTIONS: None    WEIGHT BEARING RESTRICTIONS: No  FALLS:  Has patient fallen in last 6 months? 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 [employer]  NEXT MD VISIT: Oct 30th  OBJECTIVE:   DIAGNOSTIC FINDINGS:  DEXA scan Jul 08, 2021: right femur neck Tscore -2.20, left femur neck -2.90; spine -.50  PATIENT SURVEYS:  Eval:  FOTO 48%  COGNITION: Overall cognitive status: Within functional limits for tasks assessed      MUSCLE LENGTH: Maisie Fus test: Right 15 deg; Left 10 deg   PALPATION: Tender points in left gluteals and piriformis  LUMBAR ROM:   AROM eval  Flexion Fingers to mid shin  Extension 50% limited  Right lateral flexion WFLS  Left lateral flexion Produced left radicular thigh pain  Right rotation   Left rotation    (Blank rows = not tested)  TRUNK STRENGTH:  Decreased activation of transverse abdominus muscles; abdominals 4-/5; decreased activation of lumbar multifidi; trunk extensors 4-/5     LOWER EXTREMITY MMT:  pelvic  drop with SLS left > right  MMT Right eval Left eval  Hip flexion 4 4  Hip extension 4 4  Hip  abduction    Hip adduction 3 3-  Hip internal rotation    Hip external rotation    Knee flexion    Knee extension 4 4  Ankle dorsiflexion 4 4  Ankle plantarflexion 4 4  Ankle inversion    Ankle eversion     (Blank rows = not tested)  LUMBAR SPECIAL TESTS:  Negative slump test  FUNCTIONAL TESTS:   Eval: 5x sit to stand no hands: 20.23 sec  03/13/2023: 3 Minute Walk Test:  459 ft  GAIT: Comments: no assistive device; decreased gait speed  TODAY'S TREATMENT:                                                                                                                             DATE:  03/21/23: Seated hamstring stretch 3 x 30 sec (left) Supine hamstring stretch 3 x 30 sec (left) Supine IT band stretch attempted 30 sec (patient could not do 30, decreased to 20 but she states her right shoulder is hurting and she cannot do any more of those) Seated piriformis stretch 3 x 20 sec LTR x 10 each way Posterior Pelvic Tilts  2 x 10 (initially, patient had discomfort felt she needed to stop- discussed modification to lessen discomfort- patient was then able to complete remaining reps) Supine Bent knee fall out + TA contraction x 20 each 3 way stability ball stretch x 8 each way Standing Marching with 2 sec hold x 10 each leg Manual : Attaday to Lt piriforms and Lt IT band 12 minutes  03/21/23: Manual : Attaday to Lt piriforms and Lt IT band 12 minutes LTR x 10 each way Supine ITB stretch with green strap 3 x 20 sec Supine quad stretch with green strap 3 x 20 sec Posterior Pelvic Tilts x 10 Supine Bent knee fall out + TA contraction x 20 each 3 way stability ball stretch x 8 each way Standing Marching with 2 sec hold x 10 each leg  03/19/23: NuStep L3 x 2' secondary to Lt leg cramping increase Knee flexion and hip flexor stretch foot on 2nd step rocking in/out x 8 each LE Standing calf stretch rockerboard 2x15" bil Seated physioball flexion and flexion/SB x5 each Supine LTR  3x10" bil Supine hooklying HS stretch holding behind thigh 2x30" bil Alt hand and knee press + TA contraction 5x5" each  - Pt reported Lt LE numbness Seated ball squeeze 5x5" Seated yellow band hip abd x10 Sit to stand from mat table with hip hinge and forward UE reach x10 Manual therapy: in Rt SL STM to Lt lateral and posterior hip, lumbar Lt flank, SI joint Trigger Point Dry-Needling  Treatment instructions: Expect mild to moderate muscle soreness. S/S of pneumothorax if dry needled over a lung field, and to seek immediate medical attention should they occur.  Patient verbalized understanding of these instructions and education. Patient Consent Given: Yes Education handout provided: Previously provided Muscles treated: Lt glut min, glut med, piriformis Electrical stimulation performed: No Parameters: N/A Treatment response/outcome: deep ache and release of tissue tone   PATIENT EDUCATION:  Education details: Educated patient on anatomy of shoulder and nature of impingement Person educated: Patient Education method: Explanation Education comprehension: verbalized understanding  HOME EXERCISE PROGRAM: Access Code: [email or web address] URL: [email or web address] Date: 03/16/2023 Prepared by: Clydie Braun Menke  Exercises - Seated Hamstring Stretch - 1 x daily - 7 x weekly - 2 reps - 20 sec hold - Seated Piriformis Stretch with Trunk Bend - 1 x daily - 7 x weekly - 2 reps - 20 sec hold - Seated Piriformis Stretch - 1 x daily - 7 x weekly - 2 reps - 20 sec hold - Sit to Stand - 1 x daily - 7 x weekly - 2 sets - 5 reps - Supine Lower Trunk Rotation - 1 x daily - 7 x weekly - 1 sets - 10 reps - Supine Posterior Pelvic Tilt - 1 x daily - 7 x weekly - 2 sets - 10 reps - Clamshell - 1 x daily - 7 x weekly - 2 sets - 10 reps - Seated Hip Abduction with Resistance - 1 x daily - 7 x weekly - 2 sets - 10 reps - Seated Hip Adduction Squeeze with Ball - 1 x daily - 7 x weekly - 2 sets - 10  reps - Step Up - 1 x daily - 7 x weekly - 1-2 sets - 10 reps   ASSESSMENT:  CLINICAL IMPRESSION: Patient continues to have pain but holds off on exercises if she feels any pain or discomfort at all.  We discussed modifying vs avoiding the exercises all together as her pain is likely not going to improve without a change in mechanics of the lumbar spine.  She had mentioned that her shoulder was extremely painful but then states that yesterday she was hanging from her doorframe for her back.  Discussed that this may be why her shoulder is hurting and how she is likely in a severe states of impingement when hanging from the door.  Patient shoulde benefit from continued skilled PT to address the below impairments and improve overall function.    OBJECTIVE IMPAIRMENTS: decreased activity tolerance, difficulty walking, decreased strength, increased fascial restrictions, impaired perceived functional ability, and pain.   ACTIVITY LIMITATIONS: carrying, lifting, sitting, standing, locomotion level, and caring for others  PARTICIPATION LIMITATIONS: meal prep, shopping, and occupation  PERSONAL FACTORS: Time since onset of injury/illness/exacerbation and 1-2 comorbidities: osteoporosis  are also affecting patient's functional outcome.   REHAB POTENTIAL: Good  CLINICAL DECISION MAKING: Stable/uncomplicated  EVALUATION COMPLEXITY: Low   GOALS: Goals reviewed with patient? Yes  SHORT TERM GOALS: Target date: 04/05/2023    The patient will demonstrate knowledge of basic self care strategies and exercises to promote healing  Baseline: Goal status: Ongoing  2.  Improved LE strength with 5x STS improved to 15 sec Baseline:  Goal status: Ongoing  3.  The patient will have improved trunk flexor and extensor muscle strength to at least 4+/5 needed for lifting medium weight objects such as grocery bags, dog food, cello Baseline:  Goal status: INITIAL  4.  Improved lumbar and hip mobility  needed to bend/stoop for cleaning the cat litter box Baseline:  Goal status: INITIAL      LONG TERM GOALS: Target date: 05/03/2023  The patient will be independent in a safe self progression of a home exercise program and walking program to promote further recovery of function  Baseline:  Goal status: INITIAL  2.  The patient will report a 70% improvement in pain levels with functional activities which are currently difficult including standing and walking her dog Baseline:  Goal status: INITIAL  3.  The patient will have improved hip strength to at least 4+/5 needed for standing, walking longer distances and descending stairs at home and in the community  Baseline:  Goal status: INITIAL  4.  The patient will have improved gait stamina and speed needed to ambulate 600 feet in 6 minutes  Baseline:  Goal status: INITIAL  5.  The patient will have improved FOTO score to    58%   indicating improved function with less pain  Baseline:  Goal status: INITIAL   PLAN:  PT FREQUENCY: 2x/week  PT DURATION: 8 weeks  PLANNED INTERVENTIONS: Therapeutic exercises, Therapeutic activity, Neuromuscular re-education, Gait training, Patient/Family education, Self Care, Joint mobilization, Aquatic Therapy, Dry Needling, Electrical stimulation, Cryotherapy, Moist heat, Taping, Ultrasound, Ionotophoresis 4mg /ml Dexamethasone, Manual therapy, and Re-evaluation.  PLAN FOR NEXT SESSION: Teach modification strategies for stretching and strengthening; standing core exercises; hip strengthening   Leronda Lewers B. Lovette Merta, PT 03/29/23 4:45 PM [facility] [address], [address] [place], [place] [address] Phone # [phone] Fax [phone]

## 2023-04-02 ENCOUNTER — Encounter: Payer: Medicare PPO | Admitting: Physical Therapy

## 2023-04-05 ENCOUNTER — Ambulatory Visit: Payer: Medicare PPO | Admitting: Physical Therapy

## 2023-04-05 DIAGNOSIS — R252 Cramp and spasm: Secondary | ICD-10-CM | POA: Diagnosis not present

## 2023-04-05 DIAGNOSIS — M5459 Other low back pain: Secondary | ICD-10-CM

## 2023-04-05 DIAGNOSIS — M25552 Pain in left hip: Secondary | ICD-10-CM | POA: Diagnosis not present

## 2023-04-05 DIAGNOSIS — R262 Difficulty in walking, not elsewhere classified: Secondary | ICD-10-CM | POA: Diagnosis not present

## 2023-04-05 DIAGNOSIS — M6281 Muscle weakness (generalized): Secondary | ICD-10-CM | POA: Diagnosis not present

## 2023-04-05 NOTE — Therapy (Signed)
OUTPATIENT PHYSICAL THERAPY TREATMENT NOTE   Patient Name: Sheila Anderson MRN: [id number] DOB:06-Nov-1940, 82 y.o., female Today's Date: 04/05/2023  END OF SESSION:  PT End of Session - 04/05/23 1018     Visit Number 8    Number of Visits 16    Date for PT Re-Evaluation 05/03/23    Authorization Type [employer] 16 visits    Authorization Time Period 03/08/2023 - 05/03/2023    Authorization - Visit Number 8    Authorization - Number of Visits 16    Progress Note Due on Visit 10    PT Start Time 1019    PT Stop Time 1059    PT Time Calculation (min) 40 min    Activity Tolerance Patient tolerated treatment well                 Past Medical History:  Diagnosis Date   Anxiety    Chronic cough    05-29-2019  per pt due to allergies and reflux   Cystocele with uterine prolapse    DDD (degenerative disc disease), lumbar    Eczema    Endometrial polyp    GERD (gastroesophageal reflux disease)    Hiatal hernia    History of colon polyps    History of echocardiogram    03-26-2015   ef 60-65%   History of exercise stress test    03-26-2015 in epic ,  unable to rule out ischemia due to poor exercise   History of gastric polyp    01-17-2019   duodenol polyp (low grade hyperplasia)   History of vertebral compression fracture    2017 -- L1   OA (osteoarthritis)    OAB (overactive bladder)    PMB (postmenopausal bleeding)    Pulmonary nodule    05-29-2019 was told seen in CT imaging 12-11-2018   Rash    eyelids   Seasonal allergies    Past Surgical History:  Procedure Laterality Date   CATARACT EXTRACTION W/ INTRAOCULAR LENS  IMPLANT, BILATERAL  2015  approx.   CYSTOSCOPY  2010   approx.   w/ removal bladder cyst  (per pt benign)   DILATATION & CURETTAGE/HYSTEROSCOPY WITH MYOSURE N/A 05/30/2019   Procedure: DILATATION & CURETTAGE/HYSTEROSCOPY WITH MYOSURE;  Surgeon: Genia Del, MD;  Location: [facility];  Service: Gynecology;   Laterality: N/A;   ESOPHAGOGASTRODUODENOSCOPY (EGD) WITH PROPOFOL N/A 01/17/2019   Procedure: ESOPHAGOGASTRODUODENOSCOPY (EGD) WITH PROPOFOL;  Surgeon: Jeani Hawking, MD;  Location: [facility];  Service: Endoscopy;  Laterality: N/A;   EYE SURGERY Left 07-26-2006   @[facility]    for preretinal fibrosis   HEMOSTASIS CLIP PLACEMENT  01/17/2019   Procedure: HEMOSTASIS CLIP PLACEMENT;  Surgeon: Jeani Hawking, MD;  Location: [facility];  Service: Endoscopy;;   LAPAROSCOPIC PARTIAL RIGHT COLECTOMY  02-04-2010   dr Andrey Campanile @[facility]    large polyps   POLYPECTOMY  01/17/2019   Procedure: POLYPECTOMY;  Surgeon: Jeani Hawking, MD;  Location: [facility];  Service: Endoscopy;;   REVERSE SHOULDER ARTHROPLASTY Right 03/10/2016   Procedure: RIGHT REVERSE SHOULDER ARTHROPLASTY;  Surgeon: Beverely Low, MD;  Location: [facility] OR;  Service: Orthopedics;  Laterality: Right;   SUBMUCOSAL LIFTING INJECTION  01/17/2019   Procedure: SUBMUCOSAL LIFTING INJECTION;  Surgeon: Jeani Hawking, MD;  Location: [facility];  Service: Endoscopy;;   Patient Active Problem List   Diagnosis Date Noted   S/P shoulder replacement 03/10/2016   Lumbosacral radiculopathy 02/09/2016    PCP: Georgina Quint MD  REFERRING PROVIDER: Albertha Ghee MD  REFERRING DIAG: left hip OA, lumbar spondylitis with bil radiculopathy; pain left buttock  Rationale for Evaluation and Treatment: Rehabilitation  THERAPY DIAG:  Pain in left hip  Other low back pain  Difficulty in walking, not elsewhere classified  ONSET DATE: 2 months ago  SUBJECTIVE:                                                                                                                                                                                           SUBJECTIVE STATEMENT: It's the same.  I have to help it with my hand to lift my leg.  Mostly buttock but some radiating thigh pain has restarted.  Busy symphony week.  Coughing makes the pain go in my thigh.  Walking  Improves symptoms. Heating pad, Addaday and Alleve.  Sometimes DN helps and sometimes it doesn't.   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 Yes NPRS scale: 5/10 dull ache Pain location: left buttock; central LBP; left posterior thigh  Aggravating factors: lying down to go to sleep; present with walking; avoids vacumning; standing; limits dog food to 15# Relieving factors: can sleep on left side; supine with left leg straight, right leg bent or reverse    PRECAUTIONS: None    WEIGHT BEARING RESTRICTIONS: No  FALLS:  Has patient fallen in last 6 months? 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 [employer]  NEXT MD VISIT: Oct 30th  OBJECTIVE:   DIAGNOSTIC FINDINGS:  DEXA scan Jul 08, 2021: right femur neck Tscore -2.20, left femur neck -2.90; spine -.50  PATIENT SURVEYS:  Eval:  FOTO 48%  COGNITION: Overall cognitive status: Within functional limits for tasks assessed      MUSCLE LENGTH: Maisie Fus test: Right 15 deg; Left 10 deg   PALPATION: Tender points in left gluteals and piriformis  LUMBAR ROM:   AROM eval  Flexion Fingers to mid shin  Extension 50% limited  Right lateral flexion WFLS  Left lateral flexion Produced left radicular thigh pain  Right rotation   Left rotation    (Blank rows = not tested)  TRUNK STRENGTH:  Decreased activation of transverse abdominus muscles; abdominals 4-/5; decreased activation of lumbar multifidi; trunk extensors 4-/5     LOWER EXTREMITY MMT:   pelvic drop with SLS left > right  MMT Right eval Left eval  Hip flexion 4 4  Hip extension 4 4  Hip abduction    Hip adduction 3 3-  Hip internal rotation    Hip external rotation    Knee flexion    Knee extension 4 4  Ankle dorsiflexion 4 4  Ankle plantarflexion 4 4  Ankle inversion    Ankle eversion     (Blank rows = not tested)  LUMBAR SPECIAL TESTS:  Negative slump test  FUNCTIONAL TESTS:   Eval: 5x sit to stand no hands: 20.23 sec 10/24: 19.94 sec (left knee pain)  03/13/2023: 3 Minute Walk Test:  459 ft  GAIT: Comments: no assistive device; decreased gait speed  TODAY'S TREATMENT:                                                                                                                             DATE:  04/05/23: 10x each side floor sliders hip abduction right/left 6x floor slider circles clock and counter clock right/left Holding 5# weight in left hand with right hip flexion  5x STS retest Leaning on elbows/chest on tall table: donkey kick 10x right/left Leaning on elbows/chest on tall table:  hip extension 10x right/left Pt demo hip clicking with hip abduction/external rotation Manual therapy: in Rt SL STM to Lt lateral and posterior hip Trigger Point Dry-Needling  Treatment instructions: Expect mild to moderate muscle soreness. S/S of pneumothorax if dry needled over a lung field, and to seek immediate medical attention should they occur. Patient verbalized understanding of these instructions and education. Patient Consent Given: Yes Education handout provided: Previously provided Muscles treated: Lt glut min, glut med, piriformis  attempted to add ES briefly however pt complained of a cramping sensation with DN marination and unable to tolerate keeping the needle in for very long Electrical stimulation performed: No Parameters: N/A Treatment response/outcome: deep ache and release of tissue tone Heat 2 min    03/21/23: Seated hamstring stretch  3 x 30 sec (left) Supine hamstring stretch 3 x 30 sec (left) Supine IT band stretch attempted 30 sec (patient could not do 30, decreased to 20 but she states her right shoulder is hurting and she cannot do any more of those) Seated piriformis stretch 3 x 20 sec LTR x 10 each way Posterior Pelvic Tilts  2 x 10 (initially, patient had discomfort felt she needed to stop- discussed modification to lessen discomfort- patient was then able to complete remaining reps) Supine Bent knee fall out + TA contraction x 20 each 3 way stability ball stretch x 8 each way Standing Marching with 2 sec hold x 10 each leg Manual : Attaday to Lt piriforms and Lt IT band 12 minutes  03/21/23: Manual : Attaday to Lt piriforms and  Lt IT band 12 minutes LTR x 10 each way Supine ITB stretch with green strap 3 x 20 sec Supine quad stretch with green strap 3 x 20 sec Posterior Pelvic Tilts x 10 Supine Bent knee fall out + TA contraction x 20 each 3 way stability ball stretch x 8 each way Standing Marching with 2 sec hold x 10 each leg  03/19/23: NuStep L3 x 2' secondary to Lt leg cramping increase Knee flexion and hip flexor stretch foot on 2nd step rocking in/out x 8 each LE Standing calf stretch rockerboard 2x15" bil Seated physioball flexion and flexion/SB x5 each Supine LTR 3x10" bil Supine hooklying HS stretch holding behind thigh 2x30" bil Alt hand and knee press + TA contraction 5x5" each  - Pt reported Lt LE numbness Seated ball squeeze 5x5" Seated yellow band hip abd x10 Sit to stand from mat table with hip hinge and forward UE reach x10 Manual therapy: in Rt SL STM to Lt lateral and posterior hip, lumbar Lt flank, SI joint Trigger Point Dry-Needling  Treatment instructions: Expect mild to moderate muscle soreness. S/S of pneumothorax if dry needled over a lung field, and to seek immediate medical attention should they occur. Patient verbalized understanding of these instructions and education. Patient  Consent Given: Yes Education handout provided: Previously provided Muscles treated: Lt glut min, glut med, piriformis Electrical stimulation performed: No Parameters: N/A Treatment response/outcome: deep ache and release of tissue tone   PATIENT EDUCATION:  Education details: Educated patient on anatomy of shoulder and nature of impingement Person educated: Patient Education method: Explanation Education comprehension: verbalized understanding  HOME EXERCISE PROGRAM: Access Code: [email or web address] URL: [email or web address] Date: 03/16/2023 Prepared by: Clydie Braun Menke  Exercises - Seated Hamstring Stretch - 1 x daily - 7 x weekly - 2 reps - 20 sec hold - Seated Piriformis Stretch with Trunk Bend - 1 x daily - 7 x weekly - 2 reps - 20 sec hold - Seated Piriformis Stretch - 1 x daily - 7 x weekly - 2 reps - 20 sec hold - Sit to Stand - 1 x daily - 7 x weekly - 2 sets - 5 reps - Supine Lower Trunk Rotation - 1 x daily - 7 x weekly - 1 sets - 10 reps - Supine Posterior Pelvic Tilt - 1 x daily - 7 x weekly - 2 sets - 10 reps - Clamshell - 1 x daily - 7 x weekly - 2 sets - 10 reps - Seated Hip Abduction with Resistance - 1 x daily - 7 x weekly - 2 sets - 10 reps - Seated Hip Adduction Squeeze with Ball - 1 x daily - 7 x weekly - 2 sets - 10 reps - Step Up - 1 x daily - 7 x weekly - 1-2 sets - 10 reps   ASSESSMENT:  CLINICAL IMPRESSION: Modified treatment secondary to pt reports of no improvement in pain since start of care. She notes inceased difficulty with left seated hip flexion and needs UE assist to lift it.  Initiated light standing ex's with strategy to promote blood flow to the tissues in her hip and therefore reduce irritation around the sciatic nerve "bathing the nerve".  She tolerates these exercises well.  We attempted to add ES to DN for the added physiological benefit however she has too much discomfort with marination of the needles.  She follows up with the doctor  next week and she plans to discuss other medical options for treating neural symptoms.  OBJECTIVE IMPAIRMENTS: decreased activity tolerance, difficulty walking, decreased strength, increased fascial restrictions, impaired perceived functional ability, and pain.   ACTIVITY LIMITATIONS: carrying, lifting, sitting, standing, locomotion level, and caring for others  PARTICIPATION LIMITATIONS: meal prep, shopping, and occupation  PERSONAL FACTORS: Time since onset of injury/illness/exacerbation and 1-2 comorbidities: osteoporosis  are also affecting patient's functional outcome.   REHAB POTENTIAL: Good  CLINICAL DECISION MAKING: Stable/uncomplicated  EVALUATION COMPLEXITY: Low   GOALS: Goals reviewed with patient? Yes  SHORT TERM GOALS: Target date: 04/05/2023    The patient will demonstrate knowledge of basic self care strategies and exercises to promote healing  Baseline: Goal status: Ongoing  2.  Improved LE strength with 5x STS improved to 15 sec Baseline:  Goal status: Ongoing  3.  The patient will have improved trunk flexor and extensor muscle strength to at least 4+/5 needed for lifting medium weight objects such as grocery bags, dog food, cello Baseline:  Goal status: INITIAL  4.  Improved lumbar and hip mobility needed to bend/stoop for cleaning the cat litter box Baseline:  Goal status: INITIAL      LONG TERM GOALS: Target date: 05/03/2023   The patient will be independent in a safe self progression of a home exercise program and walking program to promote further recovery of function  Baseline:  Goal status: INITIAL  2.  The patient will report a 70% improvement in pain levels with functional activities which are currently difficult including standing and walking her dog Baseline:  Goal status: INITIAL  3.  The patient will have improved hip strength to at least 4+/5 needed for standing, walking longer distances and descending stairs at home and in the  community  Baseline:  Goal status: INITIAL  4.  The patient will have improved gait stamina and speed needed to ambulate 600 feet in 6 minutes  Baseline:  Goal status: INITIAL  5.  The patient will have improved FOTO score to    58%   indicating improved function with less pain  Baseline:  Goal status: INITIAL   PLAN:  PT FREQUENCY: 2x/week  PT DURATION: 8 weeks  PLANNED INTERVENTIONS: Therapeutic exercises, Therapeutic activity, Neuromuscular re-education, Gait training, Patient/Family education, Self Care, Joint mobilization, Aquatic Therapy, Dry Needling, Electrical stimulation, Cryotherapy, Moist heat, Taping, Ultrasound, Ionotophoresis 4mg /ml Dexamethasone, Manual therapy, and Re-evaluation.  PLAN FOR NEXT SESSION: sees MD Wed (may discuss Gabapentin);  encourage walking (improves symptoms) without the dog; assess response to light standing ex's for neural benefit;  DN if helpful (couldn't tolerate marination)  [facility] [address] [place], [place] [address] Phone # [phone] Fax [phone]

## 2023-04-10 ENCOUNTER — Ambulatory Visit: Payer: Medicare PPO | Admitting: Physical Therapy

## 2023-04-10 DIAGNOSIS — R262 Difficulty in walking, not elsewhere classified: Secondary | ICD-10-CM | POA: Diagnosis not present

## 2023-04-10 DIAGNOSIS — M6281 Muscle weakness (generalized): Secondary | ICD-10-CM | POA: Diagnosis not present

## 2023-04-10 DIAGNOSIS — R252 Cramp and spasm: Secondary | ICD-10-CM | POA: Diagnosis not present

## 2023-04-10 DIAGNOSIS — M5459 Other low back pain: Secondary | ICD-10-CM

## 2023-04-10 DIAGNOSIS — M25552 Pain in left hip: Secondary | ICD-10-CM

## 2023-04-10 NOTE — Therapy (Addendum)
 OUTPATIENT PHYSICAL THERAPY TREATMENT NOTE PHYSICAL THERAPY DISCHARGE SUMMARY  Visits from Start of Care: 9  Current functional level related to goals / functional outcomes: See below   Remaining deficits: See below   Education / Equipment: See below   Patient agrees to discharge. Patient goals were partially met. Patient is being discharged due to lack of progress.    Patient Name: Sheila Anderson MRN: [id number] DOB:07-Oct-1940, 82 y.o., female Today's Date: 04/10/2023  END OF SESSION:  PT End of Session - 04/10/23 1104     Visit Number 9    Number of Visits 16    Date for PT Re-Evaluation 05/03/23    Authorization Type [employer] 16 visits    Authorization Time Period 03/08/2023 - 05/03/2023    Authorization - Visit Number 9    Authorization - Number of Visits 16    Progress Note Due on Visit 10    PT Start Time 1104    PT Stop Time 1142    PT Time Calculation (min) 38 min    Activity Tolerance Patient tolerated treatment well                 Past Medical History:  Diagnosis Date   Anxiety    Chronic cough    05-29-2019  per pt due to allergies and reflux   Cystocele with uterine prolapse    DDD (degenerative disc disease), lumbar    Eczema    Endometrial polyp    GERD (gastroesophageal reflux disease)    Hiatal hernia    History of colon polyps    History of echocardiogram    03-26-2015   ef 60-65%   History of exercise stress test    03-26-2015 in epic ,  unable to rule out ischemia due to poor exercise   History of gastric polyp    01-17-2019   duodenol polyp (low grade hyperplasia)   History of vertebral compression fracture    2017 -- L1   OA (osteoarthritis)    OAB (overactive bladder)    PMB (postmenopausal bleeding)    Pulmonary nodule    05-29-2019 was told seen in CT imaging 12-11-2018   Rash    eyelids   Seasonal allergies    Past Surgical History:  Procedure Laterality Date   CATARACT EXTRACTION W/ INTRAOCULAR LENS   IMPLANT, BILATERAL  2015  approx.   CYSTOSCOPY  2010   approx.   w/ removal bladder cyst  (per pt benign)   DILATATION & CURETTAGE/HYSTEROSCOPY WITH MYOSURE N/A 05/30/2019   Procedure: DILATATION & CURETTAGE/HYSTEROSCOPY WITH MYOSURE;  Surgeon: Lavoie, Marie-Lyne, MD;  Location: [facility];  Service: Gynecology;  Laterality: N/A;   ESOPHAGOGASTRODUODENOSCOPY (EGD) WITH PROPOFOL  N/A 01/17/2019   Procedure: ESOPHAGOGASTRODUODENOSCOPY (EGD) WITH PROPOFOL ;  Surgeon: Rollin Dover, MD;  Location: [facility];  Service: Endoscopy;  Laterality: N/A;   EYE SURGERY Left 07-26-2006   @[facility]    for preretinal fibrosis   HEMOSTASIS CLIP PLACEMENT  01/17/2019   Procedure: HEMOSTASIS CLIP PLACEMENT;  Surgeon: Rollin Dover, MD;  Location: [facility];  Service: Endoscopy;;   LAPAROSCOPIC PARTIAL RIGHT COLECTOMY  02-04-2010   dr tanda @[facility]    large polyps   POLYPECTOMY  01/17/2019   Procedure: POLYPECTOMY;  Surgeon: Rollin Dover, MD;  Location: [facility];  Service: Endoscopy;;   REVERSE SHOULDER ARTHROPLASTY Right 03/10/2016   Procedure: RIGHT REVERSE SHOULDER ARTHROPLASTY;  Surgeon: Marcey Her, MD;  Location: [facility] OR;  Service: Orthopedics;  Laterality: Right;   SUBMUCOSAL LIFTING  INJECTION  01/17/2019   Procedure: SUBMUCOSAL LIFTING INJECTION;  Surgeon: Rollin Dover, MD;  Location: [facility];  Service: Endoscopy;;   Patient Active Problem List   Diagnosis Date Noted   S/P shoulder replacement 03/10/2016   Lumbosacral radiculopathy 02/09/2016    PCP: [facility] Emil Schanz MD  REFERRING PROVIDER: Orpha Stabs MD  REFERRING DIAG: left hip OA, lumbar spondylitis with bil radiculopathy; pain left buttock  Rationale for Evaluation and Treatment: Rehabilitation  THERAPY DIAG:  Pain in left hip  Other low back pain  ONSET DATE: 2 months ago  SUBJECTIVE:                                                                                                                                                                                            SUBJECTIVE STATEMENT: I did my walks without the dog and that felt good.  I did my morning kicks.  I avoided the home exercises and that was better to not do them. I didn't like the needling b/c you looked for the spots and that really hurt.  There was no benefit from the DN.  Advil  helps.  It still hurts down my thigh when I cough.  Sees. Dr. Orpha tomorrow.       It's the same.  I have to help it with my hand to lift my leg.  Mostly buttock but some radiating thigh pain has restarted.  Busy symphony week.  Coughing makes the pain go in my thigh.  Walking Improves symptoms. Heating pad, Addaday and Alleve.  Sometimes DN helps and sometimes it doesn't.   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 Yes NPRS scale: 3/10 dull ache Pain location: left buttock; central LBP; left posterior thigh  Aggravating factors: lying down to go to sleep; present with walking; avoids vacumning; standing; limits dog food to 15# Relieving factors: can sleep on left side; supine with left leg straight, right leg bent or reverse    PRECAUTIONS: None    WEIGHT BEARING RESTRICTIONS: No  FALLS:  Has patient fallen in last 6 months? No Stairs: Yes: Internal: 10 steps;     OCCUPATION: retired  PLOF: Independent  PATIENT GOALS: decrease pain; walk Great Dane 147# (he could pull me over) I haven't been able to walk him Environmental manager  dog) only going to the mailbox  NEXT MD VISIT: Oct 30th  OBJECTIVE:   DIAGNOSTIC FINDINGS:  DEXA scan Jul 08, 2021: right femur neck Tscore -2.20, left femur neck -2.90; spine -.50  PATIENT SURVEYS:  Eval:  FOTO  48%  COGNITION: Overall cognitive status: Within functional limits for tasks assessed      MUSCLE LENGTH: Debby test: Right 15 deg; Left 10 deg   PALPATION: Tender points in left gluteals and piriformis  LUMBAR ROM:   AROM eval  Flexion Fingers to mid shin  Extension 50% limited  Right lateral flexion WFLS  Left lateral flexion Produced left radicular thigh pain  Right rotation   Left rotation    (Blank rows = not tested)  TRUNK STRENGTH:  Decreased activation of transverse abdominus muscles; abdominals 4-/5; decreased activation of lumbar multifidi; trunk extensors 4-/5     LOWER EXTREMITY MMT:  pelvic drop with SLS left > right  MMT Right eval Left eval  Hip flexion 4 4  Hip extension 4 4  Hip abduction    Hip adduction 3 3-  Hip internal rotation    Hip external rotation    Knee flexion    Knee extension 4 4  Ankle dorsiflexion 4 4  Ankle plantarflexion 4 4  Ankle inversion    Ankle eversion     (Blank rows = not tested)  LUMBAR SPECIAL TESTS:  Negative slump test  FUNCTIONAL TESTS:   Eval: 5x sit to stand no hands: 20.23 sec 10/24: 19.94 sec (left knee pain)  03/13/2023: 3 Minute Walk Test:  459 ft  GAIT: Comments: no assistive device; decreased gait speed  TODAY'S TREATMENT:                                                                                                                             DATE:  04/10/23: 10x each side floor sliders hip abduction right/left 6x floor slider circles clock and counter clock right/left Holding 6# weight in left hand with right hip flexion; then switch sides 10x right/left  Leaning on elbows/chest on tall table: donkey kick 10x right/left Leaning on elbows/chest on tall table:  hip extension 10x right/left Pt requests to try Standing up straight with hip extension, attempted but uncomfortable  1/4 arcs floor sliders 5x right/left   Nu-Step (pt requests Les only secondary to shoulder pain) L1 6  min  04/05/23: 10x each side floor sliders hip abduction right/left 6x floor slider circles clock and counter clock right/left Holding 5# weight in left hand with right hip flexion  5x STS retest Leaning on elbows/chest on tall table: donkey kick 10x right/left Leaning on elbows/chest on tall table:  hip extension 10x right/left Pt demo hip clicking with hip abduction/external rotation Manual therapy: in Rt SL STM to Lt lateral and posterior hip Trigger Point Dry-Needling  Treatment instructions: Expect mild to moderate muscle soreness. S/S of pneumothorax if dry needled over a lung field, and to seek  immediate medical attention should they occur. Patient verbalized understanding of these instructions and education. Patient Consent Given: Yes Education handout provided: Previously provided Muscles treated: Lt glut min, glut med, piriformis  attempted to add ES briefly however pt complained of a cramping sensation with DN marination and unable to tolerate keeping the needle in for very long Electrical stimulation performed: No Parameters: N/A Treatment response/outcome: deep ache and release of tissue tone Heat 2 min    03/21/23: Seated hamstring stretch 3 x 30 sec (left) Supine hamstring stretch 3 x 30 sec (left) Supine IT band stretch attempted 30 sec (patient could not do 30, decreased to 20 but she states her right shoulder is hurting and she cannot do any more of those) Seated piriformis stretch 3 x 20 sec LTR x 10 each way Posterior Pelvic Tilts  2 x 10 (initially, patient had discomfort felt she needed to stop- discussed modification to lessen discomfort- patient was then able to complete remaining reps) Supine Bent knee fall out + TA contraction x 20 each 3 way stability ball stretch x 8 each way Standing Marching with 2 sec hold x 10 each leg Manual : Attaday to Lt piriforms and Lt IT band 12 minutes  03/21/23: Manual : Attaday to Lt piriforms and Lt IT band 12 minutes LTR  x 10 each way Supine ITB stretch with green strap 3 x 20 sec Supine quad stretch with green strap 3 x 20 sec Posterior Pelvic Tilts x 10 Supine Bent knee fall out + TA contraction x 20 each 3 way stability ball stretch x 8 each way Standing Marching with 2 sec hold x 10 each leg  03/19/23: NuStep L3 x 2' secondary to Lt leg cramping increase Knee flexion and hip flexor stretch foot on 2nd step rocking in/out x 8 each LE Standing calf stretch rockerboard 2x15 bil Seated physioball flexion and flexion/SB x5 each Supine LTR 3x10 bil Supine hooklying HS stretch holding behind thigh 2x30 bil Alt hand and knee press + TA contraction 5x5 each  - Pt reported Lt LE numbness Seated ball squeeze 5x5 Seated yellow band hip abd x10 Sit to stand from mat table with hip hinge and forward UE reach x10 Manual therapy: in Rt SL STM to Lt lateral and posterior hip, lumbar Lt flank, SI joint Trigger Point Dry-Needling  Treatment instructions: Expect mild to moderate muscle soreness. S/S of pneumothorax if dry needled over a lung field, and to seek immediate medical attention should they occur. Patient verbalized understanding of these instructions and education. Patient Consent Given: Yes Education handout provided: Previously provided Muscles treated: Lt glut min, glut med, piriformis Electrical stimulation performed: No Parameters: N/A Treatment response/outcome: deep ache and release of tissue tone   PATIENT EDUCATION:  Education details: Educated patient on anatomy of shoulder and nature of impingement Person educated: Patient Education method: Explanation Education comprehension: verbalized understanding  HOME EXERCISE PROGRAM: Access Code: [email or web address] URL: [email or web address] Date: 03/16/2023 Prepared by: Jarrell Menke  Exercises - Seated Hamstring Stretch - 1 x daily - 7 x weekly - 2 reps - 20 sec hold - Seated Piriformis Stretch with Trunk Bend - 1 x daily - 7 x  weekly - 2 reps - 20 sec hold - Seated Piriformis Stretch - 1 x daily - 7 x weekly - 2 reps - 20 sec hold - Sit to Stand - 1 x daily - 7 x weekly - 2 sets - 5 reps - Supine Lower Trunk Rotation - 1 x daily -  7 x weekly - 1 sets - 10 reps - Supine Posterior Pelvic Tilt - 1 x daily - 7 x weekly - 2 sets - 10 reps - Clamshell - 1 x daily - 7 x weekly - 2 sets - 10 reps - Seated Hip Abduction with Resistance - 1 x daily - 7 x weekly - 2 sets - 10 reps - Seated Hip Adduction Squeeze with Ball - 1 x daily - 7 x weekly - 2 sets - 10 reps - Step Up - 1 x daily - 7 x weekly - 1-2 sets - 10 reps   ASSESSMENT:  CLINICAL IMPRESSION: Patient will see the doctor tomorrow to discuss any other medical options given her continued pain and lack of progress.  She does rate her pain lower today than last visit but continues to have neural type symptoms with radiating thigh pain and pain with coughing.  She is able to perform low intensity active ROM ex's with low pain irritability although she does have increased buttock discomfort on the stance leg (on both sides symmetrically).  We discussed several options for PT including being put on hold for a few weeks while she undergoes further testing.  She will discuss this with the doctor tomorrow.    OBJECTIVE IMPAIRMENTS: decreased activity tolerance, difficulty walking, decreased strength, increased fascial restrictions, impaired perceived functional ability, and pain.   ACTIVITY LIMITATIONS: carrying, lifting, sitting, standing, locomotion level, and caring for others  PARTICIPATION LIMITATIONS: meal prep, shopping, and occupation  PERSONAL FACTORS: Time since onset of injury/illness/exacerbation and 1-2 comorbidities: osteoporosis are also affecting patient's functional outcome.   REHAB POTENTIAL: Good  CLINICAL DECISION MAKING: Stable/uncomplicated  EVALUATION COMPLEXITY: Low   GOALS: Goals reviewed with patient? Yes  SHORT TERM GOALS: Target date:  04/05/2023    The patient will demonstrate knowledge of basic self care strategies and exercises to promote healing  Baseline: Goal status: Ongoing  2.  Improved LE strength with 5x STS improved to 15 sec Baseline:  Goal status: Ongoing  3.  The patient will have improved trunk flexor and extensor muscle strength to at least 4+/5 needed for lifting medium weight objects such as grocery bags, dog food, cello Baseline:  Goal status: INITIAL  4.  Improved lumbar and hip mobility needed to bend/stoop for cleaning the cat litter box Baseline:  Goal status: INITIAL      LONG TERM GOALS: Target date: 05/03/2023   The patient will be independent in a safe self progression of a home exercise program and walking program to promote further recovery of function  Baseline:  Goal status: INITIAL  2.  The patient will report a 70% improvement in pain levels with functional activities which are currently difficult including standing and walking her dog Baseline:  Goal status: INITIAL  3.  The patient will have improved hip strength to at least 4+/5 needed for standing, walking longer distances and descending stairs at home and in the community  Baseline:  Goal status: INITIAL  4.  The patient will have improved gait stamina and speed needed to ambulate 600 feet in 6 minutes  Baseline:  Goal status: INITIAL  5.  The patient will have improved FOTO score to    58%   indicating improved function with less pain  Baseline:  Goal status: INITIAL   PLAN:  PT FREQUENCY: 2x/week  PT DURATION: 8 weeks  PLANNED INTERVENTIONS: Therapeutic exercises, Therapeutic activity, Neuromuscular re-education, Gait training, Patient/Family education, Self Care, Joint mobilization, Aquatic Therapy, Dry  Needling, Electrical stimulation, Cryotherapy, Moist heat, Taping, Ultrasound, Ionotophoresis 4mg /ml Dexamethasone , Manual therapy, and Re-evaluation.  PLAN FOR NEXT SESSION: 10th visit progress note;  sees MD Wed (may discuss Gabapentin);  encourage walking (improves symptoms) without the dog; assess response to light standing ex's for neural benefit;  DN if helpful (couldn't tolerate marination)   Delon B. Fields, PT 03/26/24 8:29 AM  Glade Pesa, PT 04/10/23 11:54 AM Phone: [phone] Fax: [phone]  [facility] [address], [address] [place], [place] [address] Phone # [phone] Fax [phone]

## 2023-04-11 ENCOUNTER — Other Ambulatory Visit: Payer: Self-pay | Admitting: Sports Medicine

## 2023-04-11 DIAGNOSIS — M1612 Unilateral primary osteoarthritis, left hip: Secondary | ICD-10-CM | POA: Diagnosis not present

## 2023-04-11 DIAGNOSIS — M4316 Spondylolisthesis, lumbar region: Secondary | ICD-10-CM | POA: Diagnosis not present

## 2023-04-11 DIAGNOSIS — M51369 Other intervertebral disc degeneration, lumbar region without mention of lumbar back pain or lower extremity pain: Secondary | ICD-10-CM

## 2023-04-11 DIAGNOSIS — M5416 Radiculopathy, lumbar region: Secondary | ICD-10-CM

## 2023-04-12 ENCOUNTER — Ambulatory Visit: Payer: Medicare PPO | Admitting: Physical Therapy

## 2023-04-17 ENCOUNTER — Encounter: Payer: Medicare PPO | Admitting: Physical Therapy

## 2023-04-19 ENCOUNTER — Encounter: Payer: Medicare PPO | Admitting: Physical Therapy

## 2023-04-24 ENCOUNTER — Encounter: Payer: Medicare PPO | Admitting: Physical Therapy

## 2023-04-26 ENCOUNTER — Encounter: Payer: Medicare PPO | Admitting: Physical Therapy

## 2023-05-01 ENCOUNTER — Encounter: Payer: Medicare PPO | Admitting: Physical Therapy

## 2023-05-03 ENCOUNTER — Ambulatory Visit: Payer: Medicare PPO | Admitting: Physical Therapy

## 2023-05-30 DIAGNOSIS — M5451 Vertebrogenic low back pain: Secondary | ICD-10-CM | POA: Diagnosis not present

## 2023-06-22 DIAGNOSIS — H5712 Ocular pain, left eye: Secondary | ICD-10-CM | POA: Diagnosis not present

## 2023-09-02 ENCOUNTER — Other Ambulatory Visit: Payer: Self-pay | Admitting: Obstetrics & Gynecology

## 2023-09-03 NOTE — Telephone Encounter (Signed)
 Med refill request: tolterodine Last AEX: 10/19/2022 Next AEX: not yet scheduled Last MMG (if hormonal med) 07/11/2022 Refill authorized: Last Rx sent #90 with 3 refills on 08/03/2022. Please approve or deny as appropriate.

## 2024-05-06 ENCOUNTER — Other Ambulatory Visit: Payer: Self-pay | Admitting: Obstetrics and Gynecology

## 2024-05-06 NOTE — Telephone Encounter (Signed)
 Med refill request: tolterodine  (Detrol ) 1 mg tablet Last AEX: 10/19/2022 ML Next AEX: not scheduled, sent msg to front desk to schedule for annual Last MMG (if hormonal med) 07/11/22 Refill authorized: Last Rx sent #90 with zero refills on 09/04/23 JC. Please Advise?

## 2024-05-13 ENCOUNTER — Encounter: Payer: Self-pay | Admitting: Nurse Practitioner

## 2024-05-16 ENCOUNTER — Telehealth: Payer: Self-pay

## 2024-05-16 MED ORDER — ALENDRONATE SODIUM 70 MG PO TABS: 70.0000 mg | ORAL_TABLET | ORAL | 0 refills | Status: AC

## 2024-05-16 NOTE — Telephone Encounter (Signed)
 Call placed to patient, no answer, no voicemail.   Dr. Nikki -looks like this prescription was authorized 05/16/24, can you review.   Darice -please contact the pharmacy to discontinue.

## 2024-05-16 NOTE — Telephone Encounter (Signed)
 Please make appointment with provider.  Rx denied.

## 2024-05-16 NOTE — Telephone Encounter (Signed)
 [facility] contacted by phone [phone] RF for alendronate  70 mg cancelled.  Patient needs office visit.

## 2024-05-16 NOTE — Telephone Encounter (Signed)
 Med refill request: alendronate  70 mg Last AEX: 12/29/22 Next AEX: none scheduled Last MMG (if hormonal med) n/a Letter mailed to patient 05/06/24--need to schedule AEX (FD has attempted to reach patient by phone to schedule AEX). Refill denied.  Needs appointment
